# Patient Record
Sex: Male | Born: 1945 | Race: White | Hispanic: No | Marital: Married | Smoking: Never smoker
Health system: Southern US, Community
[De-identification: ages and names within clinical notes are randomized; demographics above are authoritative.]

## PROBLEM LIST (undated history)

## (undated) DIAGNOSIS — N400 Enlarged prostate without lower urinary tract symptoms: Secondary | ICD-10-CM

## (undated) DIAGNOSIS — R51 Headache: Secondary | ICD-10-CM

## (undated) DIAGNOSIS — I10 Essential (primary) hypertension: Secondary | ICD-10-CM

## (undated) DIAGNOSIS — K649 Unspecified hemorrhoids: Secondary | ICD-10-CM

## (undated) DIAGNOSIS — R202 Paresthesia of skin: Secondary | ICD-10-CM

## (undated) DIAGNOSIS — R011 Cardiac murmur, unspecified: Secondary | ICD-10-CM

## (undated) DIAGNOSIS — M109 Gout, unspecified: Secondary | ICD-10-CM

## (undated) DIAGNOSIS — M542 Cervicalgia: Secondary | ICD-10-CM

## (undated) DIAGNOSIS — IMO0001 Reserved for inherently not codable concepts without codable children: Secondary | ICD-10-CM

## (undated) DIAGNOSIS — R351 Nocturia: Secondary | ICD-10-CM

## (undated) DIAGNOSIS — G4700 Insomnia, unspecified: Secondary | ICD-10-CM

## (undated) DIAGNOSIS — N401 Enlarged prostate with lower urinary tract symptoms: Secondary | ICD-10-CM

## (undated) DIAGNOSIS — K219 Gastro-esophageal reflux disease without esophagitis: Secondary | ICD-10-CM

## (undated) DIAGNOSIS — C4492 Squamous cell carcinoma of skin, unspecified: Secondary | ICD-10-CM

## (undated) DIAGNOSIS — R42 Dizziness and giddiness: Secondary | ICD-10-CM

## (undated) DIAGNOSIS — C801 Malignant (primary) neoplasm, unspecified: Secondary | ICD-10-CM

## (undated) DIAGNOSIS — R519 Headache, unspecified: Secondary | ICD-10-CM

## (undated) DIAGNOSIS — R200 Anesthesia of skin: Secondary | ICD-10-CM

## (undated) DIAGNOSIS — M4802 Spinal stenosis, cervical region: Secondary | ICD-10-CM

## (undated) DIAGNOSIS — G992 Myelopathy in diseases classified elsewhere: Secondary | ICD-10-CM

## (undated) DIAGNOSIS — I809 Phlebitis and thrombophlebitis of unspecified site: Secondary | ICD-10-CM

## (undated) DIAGNOSIS — M1990 Unspecified osteoarthritis, unspecified site: Secondary | ICD-10-CM

## (undated) DIAGNOSIS — Z9889 Other specified postprocedural states: Secondary | ICD-10-CM

## (undated) DIAGNOSIS — R112 Nausea with vomiting, unspecified: Secondary | ICD-10-CM

---

## 2015-09-07 ENCOUNTER — Ambulatory Visit
Admission: RE | Admit: 2015-09-07 | Discharge: 2015-09-07 | Disposition: A | Discharge status: Home / Self Care | Payer: Medicare HMO | Source: Ambulatory Visit | Attending: Preventative Medicine | Admitting: Preventative Medicine

## 2015-09-07 ENCOUNTER — Other Ambulatory Visit: Payer: Self-pay | Admitting: Preventative Medicine

## 2015-09-07 DIAGNOSIS — M4182 Other forms of scoliosis, cervical region: Secondary | ICD-10-CM | POA: Diagnosis present

## 2015-09-07 DIAGNOSIS — M542 Cervicalgia: Secondary | ICD-10-CM | POA: Insufficient documentation

## 2015-09-13 ENCOUNTER — Emergency Department: Payer: Medicare HMO

## 2015-09-13 ENCOUNTER — Encounter: Payer: Self-pay | Admitting: Emergency Medicine

## 2015-09-13 ENCOUNTER — Emergency Department
Admission: EM | Admit: 2015-09-13 | Discharge: 2015-09-13 | Disposition: A | Discharge status: Home / Self Care | Payer: Medicare HMO | Attending: Emergency Medicine | Admitting: Emergency Medicine

## 2015-09-13 ENCOUNTER — Other Ambulatory Visit: Payer: Self-pay

## 2015-09-13 DIAGNOSIS — I10 Essential (primary) hypertension: Secondary | ICD-10-CM | POA: Insufficient documentation

## 2015-09-13 DIAGNOSIS — Z79899 Other long term (current) drug therapy: Secondary | ICD-10-CM | POA: Insufficient documentation

## 2015-09-13 DIAGNOSIS — M4802 Spinal stenosis, cervical region: Secondary | ICD-10-CM | POA: Diagnosis not present

## 2015-09-13 DIAGNOSIS — Z791 Long term (current) use of non-steroidal anti-inflammatories (NSAID): Secondary | ICD-10-CM | POA: Diagnosis not present

## 2015-09-13 DIAGNOSIS — Y9389 Activity, other specified: Secondary | ICD-10-CM | POA: Insufficient documentation

## 2015-09-13 DIAGNOSIS — Y9289 Other specified places as the place of occurrence of the external cause: Secondary | ICD-10-CM | POA: Diagnosis not present

## 2015-09-13 DIAGNOSIS — Z1389 Encounter for screening for other disorder: Secondary | ICD-10-CM

## 2015-09-13 DIAGNOSIS — Y998 Other external cause status: Secondary | ICD-10-CM | POA: Diagnosis not present

## 2015-09-13 DIAGNOSIS — M6281 Muscle weakness (generalized): Secondary | ICD-10-CM | POA: Diagnosis present

## 2015-09-13 DIAGNOSIS — R29898 Other symptoms and signs involving the musculoskeletal system: Secondary | ICD-10-CM

## 2015-09-13 HISTORY — DX: Unspecified osteoarthritis, unspecified site: M19.90

## 2015-09-13 HISTORY — DX: Essential (primary) hypertension: I10

## 2015-09-13 HISTORY — DX: Gout, unspecified: M10.9

## 2015-09-13 LAB — COMPREHENSIVE METABOLIC PANEL
ALK PHOS: 59 U/L (ref 38–126)
ALT: 17 U/L (ref 17–63)
AST: 19 U/L (ref 15–41)
Albumin: 3.8 g/dL (ref 3.5–5.0)
Anion gap: 5 (ref 5–15)
BILIRUBIN TOTAL: 0.9 mg/dL (ref 0.3–1.2)
BUN: 31 mg/dL — AB (ref 6–20)
CALCIUM: 10 mg/dL (ref 8.9–10.3)
CO2: 30 mmol/L (ref 22–32)
CREATININE: 0.66 mg/dL (ref 0.61–1.24)
Chloride: 95 mmol/L — ABNORMAL LOW (ref 101–111)
Glucose, Bld: 100 mg/dL — ABNORMAL HIGH (ref 65–99)
Potassium: 3.9 mmol/L (ref 3.5–5.1)
Sodium: 130 mmol/L — ABNORMAL LOW (ref 135–145)
Total Protein: 6.9 g/dL (ref 6.5–8.1)

## 2015-09-13 LAB — CBC WITH DIFFERENTIAL/PLATELET
BASOS PCT: 1 %
Basophils Absolute: 0.1 10*3/uL (ref 0–0.1)
EOS ABS: 0.2 10*3/uL (ref 0–0.7)
EOS PCT: 2 %
HCT: 40.5 % (ref 40.0–52.0)
HEMOGLOBIN: 13.5 g/dL (ref 13.0–18.0)
Lymphocytes Relative: 21 %
Lymphs Abs: 1.7 10*3/uL (ref 1.0–3.6)
MCH: 30 pg (ref 26.0–34.0)
MCHC: 33.4 g/dL (ref 32.0–36.0)
MCV: 89.7 fL (ref 80.0–100.0)
Monocytes Absolute: 0.7 10*3/uL (ref 0.2–1.0)
Monocytes Relative: 9 %
NEUTROS PCT: 67 %
Neutro Abs: 5.5 10*3/uL (ref 1.4–6.5)
PLATELETS: 198 10*3/uL (ref 150–440)
RBC: 4.52 MIL/uL (ref 4.40–5.90)
RDW: 13.4 % (ref 11.5–14.5)
WBC: 8.2 10*3/uL (ref 3.8–10.6)

## 2015-09-13 LAB — TROPONIN I

## 2015-09-13 MED ORDER — MORPHINE SULFATE (PF) 4 MG/ML IV SOLN
4.0000 mg | Freq: Once | INTRAVENOUS | Status: AC
  Administered 2015-09-13: 4 mg via INTRAVENOUS

## 2015-09-13 MED ORDER — MORPHINE SULFATE (PF) 4 MG/ML IV SOLN
INTRAVENOUS | Status: AC
  Administered 2015-09-13: 4 mg via INTRAVENOUS
  Filled 2015-09-13: qty 1

## 2015-09-13 MED ORDER — HYDROMORPHONE HCL 1 MG/ML IJ SOLN
0.5000 mg | Freq: Once | INTRAMUSCULAR | Status: AC
Start: 2015-09-13 — End: 2015-09-13
  Administered 2015-09-13: 0.5 mg via INTRAVENOUS

## 2015-09-13 MED ORDER — ONDANSETRON 4 MG PO TBDP
4.0000 mg | ORAL_TABLET | Freq: Once | ORAL | Status: AC
  Administered 2015-09-13 (×2): 4 mg via ORAL

## 2015-09-13 MED ORDER — ONDANSETRON 4 MG PO TBDP
ORAL_TABLET | ORAL | Status: AC
  Administered 2015-09-13: 4 mg via ORAL
  Filled 2015-09-13: qty 1

## 2015-09-13 MED ORDER — HYDROMORPHONE HCL 1 MG/ML IJ SOLN
INTRAMUSCULAR | Status: AC
  Administered 2015-09-13: 0.5 mg via INTRAVENOUS
  Filled 2015-09-13: qty 1

## 2015-09-13 MED ORDER — ONDANSETRON HCL 4 MG/2ML IJ SOLN
INTRAMUSCULAR | Status: AC
Start: 2015-09-13 — End: 2015-09-13
  Administered 2015-09-13: 4 mg via INTRAVENOUS
  Filled 2015-09-13: qty 2

## 2015-09-13 MED ORDER — SODIUM CHLORIDE 0.9 % IV SOLN
Freq: Once | INTRAVENOUS | Status: AC
  Administered 2015-09-13: 14:00:00 via INTRAVENOUS

## 2015-09-13 MED ORDER — ONDANSETRON HCL 4 MG/2ML IJ SOLN
4.0000 mg | Freq: Once | INTRAMUSCULAR | Status: AC
  Administered 2015-09-13: 4 mg via INTRAVENOUS

## 2015-09-13 NOTE — ED Notes (Signed)
Received a call from MRI - additional pain medication taken to MRI and administered  10/10 pain reported by pt

## 2015-09-13 NOTE — ED Notes (Signed)
meds administered as ordered - administered by Vicente Males RN    Pt to MRI

## 2015-09-13 NOTE — ED Notes (Signed)
Reports pain in neck x a week also tingling in right arm. No facial droop, right grip weaker than left.  Skin w/d

## 2015-09-13 NOTE — ED Provider Notes (Signed)
[facility] Emergency Department Provider Note  ____________________________________________  Time seen: Approximately 8:07 AM  I have reviewed the triage vital signs and the nursing notes.   HISTORY  Chief Complaint Weakness    HPI Benjamin HATTEN Sr. is a 70 y.o. male who complains of 6 months of increasing weakness in his right arm. He also complains of pain and popping sensations in his neck. He does not have any incoordination in his right arm. Reports his legs are working okay and does not have any headaches coughing or any other problems. He reports he had a lesion on his back with the dermatologist took off. He was told that the rash she has on his back was from his dressing he put there is a rash which looks like shingles does not hurt. In the dermatologist told him it was contact dermatitis type of problem.Patient had plain films of the neck which showed DJD and the radiologist recommended chest x-rays appeared to be an infiltrate in the upper part of the lung. Patient also complains of bad low back pain whenever he tries to lift anything with any weight to it at all.     Past Medical History  Diagnosis Date  . Hypertension   . Arthritis   . Gout     There are no active problems to display for this patient.   History reviewed. No pertinent past surgical history.  Current Outpatient Rx  Name  Route  Sig  Dispense  Refill  . atenolol (TENORMIN) 100 MG tablet   Oral   Take 100 mg by mouth daily.         . diclofenac (VOLTAREN) 75 MG EC tablet   Oral   Take 75 mg by mouth 2 (two) times daily.         Marland Kitchen lisinopril-hydrochlorothiazide (PRINZIDE,ZESTORETIC) 20-12.5 MG tablet   Oral   Take 2 tablets by mouth daily.           Allergies Codeine  History reviewed. No pertinent family history.  Social History Social History  Substance Use Topics  . Smoking status: Never Smoker   . Smokeless tobacco: None  . Alcohol Use: None     Review of Systems Constitutional: No fever/chills Eyes: No visual changes. ENT: No sore throat. Cardiovascular: Denies chest pain. Respiratory: Denies shortness of breath. Gastrointestinal: No abdominal pain.  No nausea, no vomiting.  No diarrhea.  No constipation. Genitourinary: Negative for dysuria. Musculoskeletal: Negative for back pain. Skin: See history of present illness Neurological: Negative for headaches, focal weakness or numbness.  10-point ROS otherwise negative.  ____________________________________________   PHYSICAL EXAM:  VITAL SIGNS: ED Triage Vitals  Enc Vitals Group     BP 09/13/15 0729 110/66 mmHg     Pulse Rate 09/13/15 0729 90     Resp 09/13/15 0729 18     Temp 09/13/15 0729 98.2 F (36.8 C)     Temp Source 09/13/15 0729 Oral     SpO2 09/13/15 0729 100 %     Weight 09/13/15 0729 195 lb (88.451 kg)     Height 09/13/15 0729 5\' 9"  (1.753 m)     Head Cir --      Peak Flow --      Pain Score 09/13/15 0724 9     Pain Loc --      Pain Edu? --      Excl. in [place]? --    Constitutional: Alert and oriented. Well appearing and in no acute  distress. Eyes: Conjunctivae are normal. PERRL. EOMI. Head: Atraumatic. Nose: No congestion/rhinnorhea. Mouth/Throat: Mucous membranes are moist.  Oropharynx non-erythematous. Neck: No stridor.  No cervical spine tenderness to palpation. Cardiovascular: Normal rate, regular rhythm. Grossly normal heart sounds.  Good peripheral circulation. Respiratory: Normal respiratory effort.  No retractions. Lungs CTAB. Gastrointestinal: Soft and nontender. No distention. No abdominal bruits. No CVA tenderness. Musculoskeletal: No lower extremity tenderness nor edema.  No joint effusions. Neurologic:  Normal speech and language. Cranial nerves II through XII appear to be intact motor strength is 4 over 5 in the right arm 5 over 5 in the left. Finger to nose is normal bilaterally legs have normal strength. Sensation appears to be  intact Skin:  Skin is warm, dry and intact. No rash noted. Psychiatric: Mood and affect are normal. Speech and behavior are normal.  ____________________________________________   LABS (all labs ordered are listed, but only abnormal results are displayed)  Labs Reviewed  COMPREHENSIVE METABOLIC PANEL - Abnormal; Notable for the following:    Sodium 130 (*)    Chloride 95 (*)    Glucose, Bld 100 (*)    BUN 31 (*)    All other components within normal limits  TROPONIN I  CBC WITH DIFFERENTIAL/PLATELET   ____________________________________________  EKG  EKG read and interpreted by me shows normal sinus rhythm a rate of 92 left axis poor R-wave progression otherwise normal. ____________________________________________  RADIOLOGY  Chest x-ray shows some atelectasis in the right middle lobe. Per radiology. L-spine shows DJD per radiology. This is present multilevel. ____________________________________________   PROCEDURES Patient signed out to be discharged got pale and sweaty. Felt lightheaded. EKG was repeated but did not show any acute ST-T wave changes and was bradycardic with PACs. Troponin was done was negative. Patient was given some fluids set up gradually and this time was able to leave without any difficulty. ____________________________________________   INITIAL IMPRESSION / ASSESSMENT AND PLAN / ED COURSE  Pertinent labs & imaging results that were available during my care of the patient were reviewed by me and considered in my medical decision making (see chart for details).   ____________________________________________   FINAL CLINICAL IMPRESSION(S) / ED DIAGNOSES  Final diagnoses:  Spinal stenosis of cervical region      Nena Polio, MD 09/13/15 [phone]

## 2015-09-13 NOTE — ED Notes (Signed)
Discharged per Dr. Cinda Quest if pt able to walk without difficulty and with stable vitals signs.

## 2015-09-13 NOTE — ED Notes (Signed)
Fall risk bracelet applied 

## 2015-09-13 NOTE — ED Notes (Signed)
Pt to discharge to home   Instructions reviewed and he and his spouse verbalized understanding and agreement

## 2015-09-13 NOTE — ED Notes (Signed)
Upon returning to the room with a WC to transfer him to the lobby  I found the pt lying in bed fully dressed  - cold, clammy diaphoretic  "I don't feel right - I am dizzy."  MD Malinda notified

## 2015-09-14 ENCOUNTER — Encounter (HOSPITAL_COMMUNITY): Payer: Self-pay | Admitting: Internal Medicine

## 2015-09-14 ENCOUNTER — Emergency Department
Admission: EM | Admit: 2015-09-14 | Discharge: 2015-09-14 | Disposition: A | Discharge status: Other Acute Inpatient Hospital | Payer: Medicare HMO | Attending: Emergency Medicine | Admitting: Emergency Medicine

## 2015-09-14 ENCOUNTER — Encounter: Payer: Self-pay | Admitting: Emergency Medicine

## 2015-09-14 ENCOUNTER — Inpatient Hospital Stay (HOSPITAL_COMMUNITY)
Admission: AD | Admit: 2015-09-14 | Discharge: 2015-09-16 | DRG: 092 | Disposition: A | Discharge status: Home / Self Care | Payer: Medicare HMO | Source: Other Acute Inpatient Hospital | Attending: Internal Medicine | Admitting: Internal Medicine

## 2015-09-14 DIAGNOSIS — R29898 Other symptoms and signs involving the musculoskeletal system: Secondary | ICD-10-CM | POA: Diagnosis not present

## 2015-09-14 DIAGNOSIS — G9520 Unspecified cord compression: Secondary | ICD-10-CM

## 2015-09-14 DIAGNOSIS — I10 Essential (primary) hypertension: Secondary | ICD-10-CM | POA: Diagnosis present

## 2015-09-14 DIAGNOSIS — E860 Dehydration: Secondary | ICD-10-CM | POA: Diagnosis present

## 2015-09-14 DIAGNOSIS — M1990 Unspecified osteoarthritis, unspecified site: Secondary | ICD-10-CM | POA: Diagnosis present

## 2015-09-14 DIAGNOSIS — R200 Anesthesia of skin: Secondary | ICD-10-CM

## 2015-09-14 DIAGNOSIS — R001 Bradycardia, unspecified: Secondary | ICD-10-CM | POA: Diagnosis present

## 2015-09-14 DIAGNOSIS — E871 Hypo-osmolality and hyponatremia: Secondary | ICD-10-CM | POA: Diagnosis present

## 2015-09-14 DIAGNOSIS — M4802 Spinal stenosis, cervical region: Secondary | ICD-10-CM

## 2015-09-14 DIAGNOSIS — R531 Weakness: Secondary | ICD-10-CM | POA: Insufficient documentation

## 2015-09-14 DIAGNOSIS — M5412 Radiculopathy, cervical region: Secondary | ICD-10-CM | POA: Diagnosis present

## 2015-09-14 DIAGNOSIS — M109 Gout, unspecified: Secondary | ICD-10-CM | POA: Diagnosis present

## 2015-09-14 DIAGNOSIS — R5381 Other malaise: Secondary | ICD-10-CM | POA: Diagnosis not present

## 2015-09-14 DIAGNOSIS — R110 Nausea: Secondary | ICD-10-CM | POA: Insufficient documentation

## 2015-09-14 DIAGNOSIS — R519 Headache, unspecified: Secondary | ICD-10-CM

## 2015-09-14 DIAGNOSIS — L089 Local infection of the skin and subcutaneous tissue, unspecified: Secondary | ICD-10-CM | POA: Diagnosis present

## 2015-09-14 DIAGNOSIS — R9431 Abnormal electrocardiogram [ECG] [EKG]: Secondary | ICD-10-CM | POA: Diagnosis present

## 2015-09-14 DIAGNOSIS — R202 Paresthesia of skin: Secondary | ICD-10-CM | POA: Diagnosis not present

## 2015-09-14 DIAGNOSIS — K5900 Constipation, unspecified: Secondary | ICD-10-CM | POA: Diagnosis present

## 2015-09-14 DIAGNOSIS — R253 Fasciculation: Secondary | ICD-10-CM

## 2015-09-14 DIAGNOSIS — Z885 Allergy status to narcotic agent status: Secondary | ICD-10-CM

## 2015-09-14 DIAGNOSIS — C4490 Unspecified malignant neoplasm of skin, unspecified: Secondary | ICD-10-CM | POA: Diagnosis present

## 2015-09-14 DIAGNOSIS — G9529 Other cord compression: Principal | ICD-10-CM | POA: Diagnosis present

## 2015-09-14 DIAGNOSIS — Z886 Allergy status to analgesic agent status: Secondary | ICD-10-CM

## 2015-09-14 DIAGNOSIS — R51 Headache: Secondary | ICD-10-CM | POA: Insufficient documentation

## 2015-09-14 DIAGNOSIS — Z79899 Other long term (current) drug therapy: Secondary | ICD-10-CM | POA: Insufficient documentation

## 2015-09-14 DIAGNOSIS — R609 Edema, unspecified: Secondary | ICD-10-CM | POA: Diagnosis not present

## 2015-09-14 DIAGNOSIS — R0600 Dyspnea, unspecified: Secondary | ICD-10-CM | POA: Diagnosis not present

## 2015-09-14 DIAGNOSIS — M542 Cervicalgia: Secondary | ICD-10-CM

## 2015-09-14 LAB — CBC
HCT: 40.4 % (ref 40.0–52.0)
HCT: 36.7 % — ABNORMAL LOW (ref 39.0–52.0)
Hemoglobin: 13.4 g/dL (ref 13.0–18.0)
Hemoglobin: 12.4 g/dL — ABNORMAL LOW (ref 13.0–17.0)
MCH: 29.6 pg (ref 26.0–34.0)
MCH: 30 pg (ref 26.0–34.0)
MCHC: 33.2 g/dL (ref 32.0–36.0)
MCHC: 33.8 g/dL (ref 30.0–36.0)
MCV: 89.2 fL (ref 80.0–100.0)
MCV: 88.9 fL (ref 78.0–100.0)
PLATELETS: 201 10*3/uL (ref 150–400)
Platelets: 200 10*3/uL (ref 150–440)
RBC: 4.53 MIL/uL (ref 4.40–5.90)
RBC: 4.13 MIL/uL — AB (ref 4.22–5.81)
RDW: 13.6 % (ref 11.5–14.5)
RDW: 12.8 % (ref 11.5–15.5)
WBC: 8.5 10*3/uL (ref 3.8–10.6)
WBC: 8.1 10*3/uL (ref 4.0–10.5)

## 2015-09-14 LAB — URINALYSIS COMPLETE WITH MICROSCOPIC (ARMC ONLY)
BACTERIA UA: NONE SEEN
Bilirubin Urine: NEGATIVE
Glucose, UA: NEGATIVE mg/dL
Hgb urine dipstick: NEGATIVE
KETONES UR: NEGATIVE mg/dL
LEUKOCYTES UA: NEGATIVE
NITRITE: NEGATIVE
PH: 6 (ref 5.0–8.0)
PROTEIN: NEGATIVE mg/dL
SPECIFIC GRAVITY, URINE: 1.016 (ref 1.005–1.030)

## 2015-09-14 LAB — BASIC METABOLIC PANEL
Anion gap: 8 (ref 5–15)
BUN: 26 mg/dL — ABNORMAL HIGH (ref 6–20)
CALCIUM: 10 mg/dL (ref 8.9–10.3)
CO2: 28 mmol/L (ref 22–32)
CREATININE: 0.67 mg/dL (ref 0.61–1.24)
Chloride: 92 mmol/L — ABNORMAL LOW (ref 101–111)
GFR calc non Af Amer: >60 mL/min (ref >60)
Glucose, Bld: 96 mg/dL (ref 65–99)
Potassium: 4.1 mmol/L (ref 3.5–5.1)
SODIUM: 128 mmol/L — AB (ref 135–145)

## 2015-09-14 LAB — CREATININE, SERUM
CREATININE: 0.82 mg/dL (ref 0.61–1.24)
GFR calc Af Amer: >60 mL/min (ref >60)

## 2015-09-14 MED ORDER — SODIUM CHLORIDE 0.9 % IV SOLN
INTRAVENOUS | Status: DC
  Administered 2015-09-14: 23:00:00 via INTRAVENOUS

## 2015-09-14 MED ORDER — ONDANSETRON HCL 4 MG/2ML IJ SOLN
4.0000 mg | Freq: Once | INTRAMUSCULAR | Status: AC
  Administered 2015-09-14: 4 mg via INTRAVENOUS
  Filled 2015-09-14: qty 2

## 2015-09-14 MED ORDER — POLYETHYLENE GLYCOL 3350 17 G PO PACK: 17.0000 g | PACK | Freq: Every day | ORAL | Status: DC | PRN

## 2015-09-14 MED ORDER — ATENOLOL 25 MG PO TABS
100.0000 mg | ORAL_TABLET | Freq: Once | ORAL | Status: AC
  Administered 2015-09-14: 100 mg via ORAL
  Filled 2015-09-14: qty 4

## 2015-09-14 MED ORDER — ONDANSETRON HCL 4 MG PO TABS
4.0000 mg | ORAL_TABLET | Freq: Four times a day (QID) | ORAL | Status: DC | PRN
  Administered 2015-09-16: 4 mg via ORAL
  Filled 2015-09-14: qty 1

## 2015-09-14 MED ORDER — LISINOPRIL 10 MG PO TABS
10.0000 mg | ORAL_TABLET | Freq: Every day | ORAL | Status: DC
  Administered 2015-09-15 – 2015-09-16 (×2): 10 mg via ORAL
  Filled 2015-09-14 (×2): qty 1

## 2015-09-14 MED ORDER — SODIUM CHLORIDE 0.9 % IV BOLUS (SEPSIS)
1000.0000 mL | Freq: Once | INTRAVENOUS | Status: AC
  Administered 2015-09-14: 1000 mL via INTRAVENOUS

## 2015-09-14 MED ORDER — BISACODYL 10 MG RE SUPP: 10.0000 mg | Freq: Every day | RECTAL | Status: DC | PRN

## 2015-09-14 MED ORDER — MORPHINE SULFATE (PF) 2 MG/ML IV SOLN
2.0000 mg | INTRAVENOUS | Status: DC | PRN
  Administered 2015-09-14 – 2015-09-15 (×3): 2 mg via INTRAVENOUS
  Filled 2015-09-14 (×3): qty 1

## 2015-09-14 MED ORDER — ENOXAPARIN SODIUM 40 MG/0.4ML ~~LOC~~ SOLN
40.0000 mg | SUBCUTANEOUS | Status: DC
  Administered 2015-09-14 – 2015-09-15 (×2): 40 mg via SUBCUTANEOUS
  Filled 2015-09-14 (×2): qty 0.4

## 2015-09-14 MED ORDER — KETOROLAC TROMETHAMINE 30 MG/ML IJ SOLN
30.0000 mg | Freq: Once | INTRAMUSCULAR | Status: AC
  Administered 2015-09-14: 30 mg via INTRAVENOUS
  Filled 2015-09-14: qty 1

## 2015-09-14 MED ORDER — SODIUM CHLORIDE 0.9 % IJ SOLN
3.0000 mL | Freq: Two times a day (BID) | INTRAMUSCULAR | Status: DC
  Administered 2015-09-14 – 2015-09-16 (×2): 3 mL via INTRAVENOUS

## 2015-09-14 MED ORDER — HYDROCODONE-ACETAMINOPHEN 5-325 MG PO TABS
1.0000 | ORAL_TABLET | ORAL | Status: DC | PRN
  Administered 2015-09-15 – 2015-09-16 (×7): 2 via ORAL
  Filled 2015-09-14 (×7): qty 2

## 2015-09-14 MED ORDER — SENNA 8.6 MG PO TABS
1.0000 | ORAL_TABLET | Freq: Two times a day (BID) | ORAL | Status: DC
  Administered 2015-09-14 – 2015-09-16 (×4): 8.6 mg via ORAL
  Filled 2015-09-14 (×4): qty 1

## 2015-09-14 MED ORDER — MUPIROCIN 2 % EX OINT
TOPICAL_OINTMENT | Freq: Two times a day (BID) | CUTANEOUS | Status: DC
  Administered 2015-09-14: 1 via TOPICAL
  Filled 2015-09-14: qty 22

## 2015-09-14 MED ORDER — ACETAMINOPHEN 650 MG RE SUPP: 650.0000 mg | Freq: Four times a day (QID) | RECTAL | Status: DC | PRN

## 2015-09-14 MED ORDER — MILK AND MOLASSES ENEMA
1.0000 | RECTAL | Status: DC | PRN
  Filled 2015-09-14: qty 250

## 2015-09-14 MED ORDER — DOXYCYCLINE HYCLATE 100 MG PO TABS
100.0000 mg | ORAL_TABLET | Freq: Two times a day (BID) | ORAL | Status: DC
  Administered 2015-09-14 – 2015-09-16 (×4): 100 mg via ORAL
  Filled 2015-09-14 (×4): qty 1

## 2015-09-14 MED ORDER — ONDANSETRON HCL 4 MG/2ML IJ SOLN
4.0000 mg | Freq: Four times a day (QID) | INTRAMUSCULAR | Status: DC | PRN
  Administered 2015-09-15: 4 mg via INTRAVENOUS
  Filled 2015-09-14: qty 2

## 2015-09-14 MED ORDER — DOCUSATE SODIUM 100 MG PO CAPS
100.0000 mg | ORAL_CAPSULE | Freq: Two times a day (BID) | ORAL | Status: DC
  Administered 2015-09-14 – 2015-09-16 (×4): 100 mg via ORAL
  Filled 2015-09-14 (×5): qty 1

## 2015-09-14 MED ORDER — ACETAMINOPHEN 325 MG PO TABS: 650.0000 mg | ORAL_TABLET | Freq: Four times a day (QID) | ORAL | Status: DC | PRN

## 2015-09-14 MED ORDER — HYDROMORPHONE HCL 1 MG/ML IJ SOLN
0.5000 mg | Freq: Once | INTRAMUSCULAR | Status: AC
  Administered 2015-09-14: 0.5 mg via INTRAVENOUS
  Filled 2015-09-14: qty 1

## 2015-09-14 NOTE — Consult Note (Signed)
[facility] NAME: Benjamin Turner    MR#:  [id number]  DATE OF BIRTH:  1945/11/11  DATE OF ADMISSION:  09/14/2015  PRIMARY CARE PHYSICIAN: Marguerita Merles, MD   CONSULT REQUESTING/REFERRING PHYSICIAN: Dr. Mariea Clonts  REASON FOR CONSULT: Right-sided weakness and inability to walk  CHIEF COMPLAINT:   Chief Complaint  Patient presents with  . Headache    HISTORY OF PRESENT ILLNESS:  Benjamin Turner  is a 70 y.o. male with a known history of hypertension, arthritis, gout. Presents to the emergency room with complaints of worsening right-sided weakness. Also decreased sensations on the right side. Patient was seen in the emergency room 2 days back with similar complaints and had MRI of the brain and cervical spine. Was discharged home to follow-up with the neurosurgeon as outpatient. But over the last 2 days his symptoms have quickly worsened to the point that he is unable to ambulate on his own. Does have chronic urinary urgency and some incontinence. Constipation for 2 days. Most of the symptoms have been worsening for the past 1 week. He did hear a popping sound in his neck one week back.  During my history and physical Dr. Doy Mince with neurology walked into the room and I let Dr. Doy Mince take further history and physical with the patient.  Full physical examination was not done  PAST MEDICAL HISTORY:   Past Medical History  Diagnosis Date  . Hypertension   . Arthritis   . Gout     PAST SURGICAL HISTOIRY:  History reviewed. No pertinent past surgical history.  SOCIAL HISTORY:   Social History  Substance Use Topics  . Smoking status: Never Smoker   . Smokeless tobacco: Not on file  . Alcohol Use: Not on file    FAMILY HISTORY:  No family history on file.  DRUG ALLERGIES:   Allergies  Allergen Reactions  . Codeine Nausea And Vomiting    REVIEW OF SYSTEMS:   ROS  CONSTITUTIONAL: Fatigue EYES: No blurred  or double vision.  EARS, NOSE, AND THROAT: No tinnitus or ear pain.  RESPIRATORY: No cough, shortness of breath, wheezing or hemoptysis.  CARDIOVASCULAR: No chest pain, orthopnea, edema.  GASTROINTESTINAL: No nausea, vomiting, diarrhea or abdominal pain.  GENITOURINARY: No dysuria, hematuria.  ENDOCRINE: No polyuria, nocturia,  HEMATOLOGY: No anemia, easy bruising or bleeding SKIN: No rash or lesion. MUSCULOSKELETAL: Jerking of his muscles. Chronic upper and lower back pain NEUROLOGIC: Weakness on the right side. Problems with balance. PSYCHIATRY: No anxiety or depression.   MEDICATIONS AT HOME:   Prior to Admission medications   Medication Sig Start Date End Date Taking? Authorizing Provider  atenolol (TENORMIN) 100 MG tablet Take 100 mg by mouth daily.   Yes Historical Provider, MD  diclofenac (VOLTAREN) 75 MG EC tablet Take 75 mg by mouth 2 (two) times daily.   Yes Historical Provider, MD  lisinopril-hydrochlorothiazide (PRINZIDE,ZESTORETIC) 20-12.5 MG tablet Take 2 tablets by mouth daily.   Yes Historical Provider, MD      VITAL SIGNS:  Blood pressure 131/53, pulse 35, temperature 98 F (36.7 C), temperature source Oral, resp. rate 20, height 5\' 8"  (1.727 m), weight 88.451 kg (195 lb), SpO2 96 %.  PHYSICAL EXAMINATION:  GENERAL:  70 y.o.-year-old patient lying in the bed with no acute distress.  HEENT: Head atraumatic, normocephalic.  LUNGS: Normal work of breathing ABDOMEN: No distention EXTREMITIES: No cyanosis, or clubbing.  PSYCHIATRIC: The patient is alert and oriented x 3.  Full physical examination not done  LABORATORY PANEL:   CBC  Recent Labs Lab 09/14/15 1022  WBC 8.5  HGB 13.4  HCT 40.4  PLT 200   ------------------------------------------------------------------------------------------------------------------  Chemistries   Recent Labs Lab 09/13/15 1526 09/14/15 1022  NA 130* 128*  K 3.9 4.1  CL 95* 92*  CO2 30 28  GLUCOSE 100* 96  BUN  31* 26*  CREATININE 0.66 0.67  CALCIUM 10.0 10.0  AST 19  --   ALT 17  --   ALKPHOS 59  --   BILITOT 0.9  --    ------------------------------------------------------------------------------------------------------------------  Cardiac Enzymes  Recent Labs Lab 09/13/15 1424  TROPONINI <0.03   ------------------------------------------------------------------------------------------------------------------  RADIOLOGY:  Dg Eye Foreign Body  09/13/2015  CLINICAL DATA:  Metal working/exposure; clearance prior to MRI EXAM: ORBITS FOR FOREIGN BODY - 2 VIEW COMPARISON:  None. FINDINGS: There is no evidence of metallic foreign body within the orbits. No significant bone abnormality identified. IMPRESSION: No evidence of metallic foreign body within the orbits. Electronically Signed   By: Inge Rise M.D.   On: 09/13/2015 09:41   Dg Chest 2 View  09/13/2015  CLINICAL DATA:  Two day history of chest pain EXAM: CHEST  2 VIEW COMPARISON:  None. FINDINGS: There is mild atelectatic change in the right middle lobe. Lungs elsewhere clear. Heart is upper normal in size with pulmonary vascularity within normal limits. No pneumothorax. No adenopathy. There is degenerative change in the thoracic spine. IMPRESSION: Mild right middle lobe region atelectatic change. Lungs elsewhere clear. Heart upper normal in size. Electronically Signed   By: Lowella Grip III M.D.   On: 09/13/2015 09:40   Dg Lumbar Spine Complete  09/13/2015  CLINICAL DATA:  Lumbago EXAM: LUMBAR SPINE - COMPLETE 4+ VIEW COMPARISON:  None. FINDINGS: Frontal, lateral, spot lumbosacral lateral, and bilateral oblique views were obtained. There are 5 non-rib-bearing lumbar type vertebral bodies. There is no fracture or spondylolisthesis. There is moderate disc space narrowing at L2-3, L4-5, and L5-S1 with vacuum phenomenon at L4-5. There is also degenerative change in the visualized lower thoracic regions. There is facet osteoarthritic  change to varying degrees at all levels, most notably at L4-5 and L5-S1 bilaterally. There is atherosclerotic calcification in the aorta. IMPRESSION: Multilevel osteoarthritic change.  No fracture or spondylolisthesis. Electronically Signed   By: Lowella Grip III M.D.   On: 09/13/2015 09:41   Mr Brain Wo Contrast  09/13/2015  CLINICAL DATA:  Right neck pain and tingling into the right arm for 1 week. Right arm weakness. EXAM: MRI HEAD WITHOUT CONTRAST TECHNIQUE: Multiplanar, multiecho pulse sequences of the brain and surrounding structures were obtained without intravenous contrast. COMPARISON:  MRI of the cervical spine 09/13/2015 FINDINGS: Diffusion-weighted images demonstrate no evidence for acute or subacute infarction. No acute hemorrhage or mass lesion is present. The ventricles are of normal size. Mild periventricular rib white matter changes bilaterally are slightly advanced for age. The ventricles are of normal size. 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 Remote lacunar infarcts are present in the thalami bilaterally. No significant extra-axial fluid collection is present. Flow is present in the major intracranial arteries. The globes and orbits are intact. The paranasal sinuses and mastoid air cells are clear. Skullbase is within normal limits. Midline sagittal images demonstrate prominent soft tissue posterior to the dens at C1-2. There some narrowing of the canal at this level. Moderate degenerative changes are also present at C2-3 anterior car greater extent at C3-4. Intracranial  midline sagittal images are unremarkable. IMPRESSION: 1. No acute intracranial abnormality. 2. Mild atrophy and white matter changes are somewhat advanced for age. 3. 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 4. 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 Electronically Signed   By: San Morelle M.D.   On: 09/13/2015 11:06   Mr Cervical Spine  Wo Contrast  09/13/2015  CLINICAL DATA:  Neck pain for 1 week with tingling in the right arm. EXAM: MRI CERVICAL SPINE WITHOUT CONTRAST TECHNIQUE: Multiplanar, multisequence MR imaging of the cervical spine was performed. No intravenous contrast was administered. COMPARISON:  None. FINDINGS: No marrow signal abnormality suggestive of fracture, infection, or neoplasm. Extradural hyperintensity intermittently seen posteriorly and from C2 to C3, best visualized on axial gradient imaging in the right posterior canal measuring 4 mm in maximal thickness. This could reflect degenerative cyst formation or a extradural collection/hematoma. This does not significantly contribute to degenerative stenosis. Cord is compressed by degenerative changes at multiple levels as described below. No definite cord edema but limited by motion. Craniocervical junction: Retro dental ligamentum thickening without evidence of erosion. Rotation at the C1-2 articulation with joint effusion on the right. C2-3: Disc: Mild uncovertebral spurring.  No herniation Facets: Arthropathy with probable ankylosis on the left. Canal: Patent.  Epidural cystic change as described above. Foramina: No impingement. C3-4: Disc: Disc narrowing with posterior disc osteophyte complex and uncovertebral spurs. Facets: Severe arthropathy with spurring greater on the left. Ligamentum flavum thickening and buckling Canal: Advanced stenosis with moderate cord compression. No definite cord signal abnormality. Foramina: Bilateral stenosis, particularly advanced on the left C4-5: Disc: Disc narrowing with posterior disc osteophyte complex and bilateral uncovertebral spurs Facets: Advanced bilateral arthropathy with left-sided joint effusion. Bulky ligamentum flavum thickening Canal: Advanced stenosis with moderate cord compression. No definite cord signal abnormality. Foramina: Advanced bilateral stenosis C5-6: Disc: Advanced disc narrowing with posterior disc osteophyte  complex and bulky right uncovertebral spurring. Facets: Advanced arthropathy with spurring. Ligamentum flavum thickening. Canal: Moderate stenosis with circumferential effacement of CSF and no cord mass effect. Foramina: Bilateral stenosis, worse on the right at the inferior left foramen is visible. C6-7: Disc: Disc narrowing with right uncovertebral spurring. Facets: Arthropathy with spurring greater on the left Canal: Mild stenosis, mainly discogenic Foramina: Moderate to advanced stenosis on the right. Open on the left. C7-T1: Disc: Mild bulging of the uncovered disc Facets: Arthropathy with mild anterolisthesis Canal: Patent. Foramina: No impingement. IMPRESSION: 1. 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 No definite cord signal abnormality, but limited by patient motion. 2. Multilevel foraminal stenosis, advanced from C3-4 to C5-6. 3. Dorsal epidural cystic change at C2/C3 is likely degenerative ganglion, less likely hematoma. This does not significantly contribute to the described canal stenosis. Electronically Signed   By: Monte Fantasia M.D.   On: 09/13/2015 10:53    EKG:   Orders placed or performed during the hospital encounter of 09/14/15  . ED EKG  . ED EKG  . EKG 12-Lead  . EKG 12-Lead  . EKG 12-Lead  . 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 Review of MRI of the brain and cervical spine. Discussed with Dr. Doy Mince of neurology who has advised that patient needs evaluation by a neurosurgeon as inpatient. This is not available  here at [facility]. Discussed with patient and family requested patient be transferred to [facility] in [place]. Communicated with Dr. Mariea Clonts of emergency room who will be transferring the patient.  *  Mild asymptomatic hyponatremia Could be dehydration versus SIADH from pain.   All the records are reviewed and case discussed with Consulting provider. Management plans discussed with the patient, family and they are in agreement.   TOTAL TIME TAKING CARE OF THIS PATIENT: 40 minutes. > 50% time spent in co-ordinating care and discussing with patient and family   Neita Carp M.D on 09/14/2015 at 2:38 PM  Between 7am to 6pm - Pager - [phone]  After 6pm go to [email or web address] - password [email or web address] [facility]  Office  [phone]  CC: Primary care Physician: Marguerita Merles, MD     Note: This dictation was prepared with Dragon dictation along with smaller phrase technology. Any transcriptional errors that result from this process are unintentional.

## 2015-09-14 NOTE — ED Provider Notes (Addendum)
[facility] Emergency Department Provider Note  ____________________________________________  Time seen: Approximately 9:49 AM  I have reviewed the triage vital signs and the nursing notes.   HISTORY  Chief Complaint Headache    HPI Benjamin DEETER Sr. is a 70 y.o. male history of hypertension presenting for inability to walk. Patient reports that over the past several weeks he has a aggressively worsening headache "all over" with associated neck stiffness. He has also had generalized weakness, now has developed tingling throughout the right upper extremity. This is also associated with inability to walk due to his weakness. He has lightheadedness without dizziness or room spinning sensation. He denies visual changes. No recent illness including fever, chills, cough or cold symptoms. No chest pain or shortness of breath.  Patient was seen here yesterday for same and underwent laboratory testing which did not show any abnormalities other than mild hyponatremia, and he underwent MRI of the brain that showed remote infarcts and white matter loss advanced for age but otherwise no acute abnormalities. The patient is significantly worse today and was "stumbling" and unable to walk by himself this morning.    Past Medical History  Diagnosis Date  . Hypertension   . Arthritis   . Gout     There are no active problems to display for this patient.   History reviewed. No pertinent past surgical history.  Current Outpatient Rx  Name  Route  Sig  Dispense  Refill  . atenolol (TENORMIN) 100 MG tablet   Oral   Take 100 mg by mouth daily.         . diclofenac (VOLTAREN) 75 MG EC tablet   Oral   Take 75 mg by mouth 2 (two) times daily.         Marland Kitchen lisinopril-hydrochlorothiazide (PRINZIDE,ZESTORETIC) 20-12.5 MG tablet   Oral   Take 2 tablets by mouth daily.           Allergies Codeine  No family history on file.  Social History Social History   Substance Use Topics  . Smoking status: Never Smoker   . Smokeless tobacco: None  . Alcohol Use: None    Review of Systems  Constitutional: No fever/chillsPositive lightheadedness. Negative syncope. Positive generalized weakness and "stumbling".  Eyes: No visual changes. No ptosis of the lids. No blurred or double vision. ENT: No sore throat. Cardiovascular: Denies chest pain, palpitations. Respiratory: Denies shortness of breath.  No cough. Gastrointestinal: No abdominal pain.  Positive nausea, no vomiting.  No diarrhea.  No constipation. Genitourinary: Negative for dysuria. Musculoskeletal: Negative for back pain. Positive neck pain. Skin: Negative for rash. Neurologic: Positive headache. Positive tingling in the right upper extremity. Positive inability to walk.  10-point ROS otherwise negative.  ____________________________________________   PHYSICAL EXAM:  VITAL SIGNS: ED Triage Vitals  Enc Vitals Group     BP 09/14/15 0855 124/72 mmHg     Pulse Rate 09/14/15 0855 63     Resp 09/14/15 0855 18     Temp 09/14/15 0855 98 F (36.7 C)     Temp Source 09/14/15 0855 Oral     SpO2 09/14/15 0855 97 %     Weight 09/14/15 0855 195 lb (88.451 kg)     Height 09/14/15 0855 5\' 8"  (1.727 m)     Head Cir --      Peak Flow --      Pain Score 09/14/15 0848 10     Pain Loc --      Pain  Edu? --      Excl. in [place]? --     Constitutional: Patient is alert and oriented but appears that he doesn't feel well. He is in no acute distress and nontoxic.  Eyes: Conjunctivae are normal.  EOMI. PERRLA. No ptosis of the lids. Head: Atraumatic. Nose: No congestion/rhinnorhea. Mouth/Throat: Mucous membranes are moist.  Neck: No stridor.  Mildly stiff neck but has full range of motion without significant pain. Cardiovascular: Normal rate, regular rhythm. No murmurs, rubs or gallops.  Respiratory: Normal respiratory effort.  No retractions. Lungs CTAB.  No wheezes, rales or  ronchi. Gastrointestinal: Soft and nontender. No distention. No peritoneal signs. Musculoskeletal: No LE edema. Positive fasciculations of the bilateral deltoids. Neurologic: Alert and oriented 3. Speech is clear. Face and smile symmetric. Tongue is midline but does have fasciculations. EOMI and PERRLA.  No pronator drift. 5 out of 5 grip, biceps, triceps, and 4+/5 hip flexors, plantar flexion and dorsiflexion. Normal sensation to light touch in the bilateral lower extremities, and face. Decreased sensation diffusely throughout the right upper extremity. Unable to test gait due to weakness.  Skin:  Skin is warm, dry. Dermatologic biopsies in the right lateral malleolus and left upper back. Psychiatric: Mood is depressed and affect is flat.Marland Kitchen Speech and behavior are normal.  Normal judgement.  ____________________________________________   LABS (all labs ordered are listed, but only abnormal results are displayed)  Labs Reviewed  URINALYSIS COMPLETEWITH MICROSCOPIC ([place])  CBC  BASIC METABOLIC PANEL   ____________________________________________  EKG  ED ECG REPORT I, Eula Listen, the attending physician, personally viewed and interpreted this ECG.   Date: 09/14/2015  EKG Time: 855  Rate: 74  Rhythm: Sinus arrhythmia  Axis: Leftward  Intervals:none  ST&T Change: No ST elevation. Patient appears to have sinus arrhythmia or sinus with a junctional beat after each QRS.  The patient's EKG is unchanged compared to yesterday which also shows supraventricular bigeminy. ____________________________________________  [id number]  Mr Brain Wo Contrast  09/13/2015  CLINICAL DATA:  Right neck pain and tingling into the right arm for 1 week. Right arm weakness. EXAM: MRI HEAD WITHOUT CONTRAST TECHNIQUE: Multiplanar, multiecho pulse sequences of the brain and surrounding structures were obtained without intravenous contrast. COMPARISON:  MRI of the cervical spine 09/13/2015  FINDINGS: Diffusion-weighted images demonstrate no evidence for acute or subacute infarction. No acute hemorrhage or mass lesion is present. The ventricles are of normal size. Mild periventricular rib white matter changes bilaterally are slightly advanced for age. The ventricles are of normal size. 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 Remote lacunar infarcts are present in the thalami bilaterally. No significant extra-axial fluid collection is present. Flow is present in the major intracranial arteries. The globes and orbits are intact. The paranasal sinuses and mastoid air cells are clear. Skullbase is within normal limits. Midline sagittal images demonstrate prominent soft tissue posterior to the dens at C1-2. There some narrowing of the canal at this level. Moderate degenerative changes are also present at C2-3 anterior car greater extent at C3-4. Intracranial midline sagittal images are unremarkable. IMPRESSION: 1. No acute intracranial abnormality. 2. Mild atrophy and white matter changes are somewhat advanced for age. 3. 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 4. 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 Electronically Signed   By: San Morelle M.D.   On: 09/13/2015 11:06   Mr Cervical Spine Wo Contrast  09/13/2015  CLINICAL DATA:  Neck pain for 1 week with  tingling in the right arm. EXAM: MRI CERVICAL SPINE WITHOUT CONTRAST TECHNIQUE: Multiplanar, multisequence MR imaging of the cervical spine was performed. No intravenous contrast was administered. COMPARISON:  None. FINDINGS: No marrow signal abnormality suggestive of fracture, infection, or neoplasm. Extradural hyperintensity intermittently seen posteriorly and from C2 to C3, best visualized on axial gradient imaging in the right posterior canal measuring 4 mm in maximal thickness. This could reflect degenerative cyst formation or a extradural collection/hematoma. This  does not significantly contribute to degenerative stenosis. Cord is compressed by degenerative changes at multiple levels as described below. No definite cord edema but limited by motion. Craniocervical junction: Retro dental ligamentum thickening without evidence of erosion. Rotation at the C1-2 articulation with joint effusion on the right. C2-3: Disc: Mild uncovertebral spurring.  No herniation Facets: Arthropathy with probable ankylosis on the left. Canal: Patent.  Epidural cystic change as described above. Foramina: No impingement. C3-4: Disc: Disc narrowing with posterior disc osteophyte complex and uncovertebral spurs. Facets: Severe arthropathy with spurring greater on the left. Ligamentum flavum thickening and buckling Canal: Advanced stenosis with moderate cord compression. No definite cord signal abnormality. Foramina: Bilateral stenosis, particularly advanced on the left C4-5: Disc: Disc narrowing with posterior disc osteophyte complex and bilateral uncovertebral spurs Facets: Advanced bilateral arthropathy with left-sided joint effusion. Bulky ligamentum flavum thickening Canal: Advanced stenosis with moderate cord compression. No definite cord signal abnormality. Foramina: Advanced bilateral stenosis C5-6: Disc: Advanced disc narrowing with posterior disc osteophyte complex and bulky right uncovertebral spurring. Facets: Advanced arthropathy with spurring. Ligamentum flavum thickening. Canal: Moderate stenosis with circumferential effacement of CSF and no cord mass effect. Foramina: Bilateral stenosis, worse on the right at the inferior left foramen is visible. C6-7: Disc: Disc narrowing with right uncovertebral spurring. Facets: Arthropathy with spurring greater on the left Canal: Mild stenosis, mainly discogenic Foramina: Moderate to advanced stenosis on the right. Open on the left. C7-T1: Disc: Mild bulging of the uncovered disc Facets: Arthropathy with mild anterolisthesis Canal: Patent. Foramina:  No impingement. IMPRESSION: 1. 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 No definite cord signal abnormality, but limited by patient motion. 2. Multilevel foraminal stenosis, advanced from C3-4 to C5-6. 3. Dorsal epidural cystic change at C2/C3 is likely degenerative ganglion, less likely hematoma. This does not significantly contribute to the described canal stenosis. Electronically Signed   By: Monte Fantasia M.D.   On: 09/13/2015 10:53    ____________________________________________   PROCEDURES  Procedure(s) performed: None  Critical Care performed: No ____________________________________________   INITIAL IMPRESSION / ASSESSMENT AND PLAN / ED COURSE  Pertinent labs & imaging results that were available during my care of the patient were reviewed by me and considered in my medical decision making (see chart for details).  70 y.o. male with a history of hypertension presenting with generalized weakness, headache, neck pain, tickling in the right arm, and abnormal EKG. I am concerned about ALS this patient given the fasciculations of his deltoid muscles as well as his tongue. I spoke with Dr. Doy Mince who will come see the patient. The patient also has supraventricular bigeminy which she had yesterday but may be a new finding for him. Anticipate admission.  ----------------------------------------- 11:35 AM on 09/14/2015 -----------------------------------------  The patient has slightly worsened hyponatremia with sodium of 128 today. He continues to feel weak and will be admitted to the hospital. It is possible that his right arm tingling may be related to cervical radiculopathy given his C-spine changes  that were noted in the MRI yesterday. Dr. Doy Mince will still come see the patient because I'm concerned about the fasciculations.  ----------------------------------------- 2:34 PM on  09/14/2015 -----------------------------------------  The patient was seen and evaluated by both the hospitalist and Dr. Doy Mince, our neurologist on-call. His symptoms seem most consistent with cervical spinal stenosis, but given the extent of his symptoms, they would recommend transfer for evaluation by spine specialist. We do not have a spine specialist here at [facility] who has the capacity to evaluate this patient for possible intervention. I will plan to call for transfer.  ----------------------------------------- 3:19 PM on 09/14/2015 -----------------------------------------  I have spoken with [facility] for [facility], and they have been in contact with the neurosurgeon on-call. This neurosurgeon did hear about the patient yesterday from Dr. Rip Harbour and recommended outpatient follow-up on Friday. Unfortunately, the patient is now no longer able to walk so he is not safe for outpatient follow-up. We are trying to have the patient admitted to the hospitalist with neurosurgical consultation.  ____________________________________________  FINAL CLINICAL IMPRESSION(S) / ED DIAGNOSES  Final diagnoses:  Fasciculation of tongue  Muscle fasciculation  Acute nonintractable headache, unspecified headache type  Neck pain  Numbness and tingling of right arm      NEW MEDICATIONS STARTED DURING THIS VISIT:  New Prescriptions   No medications on file     Eula Listen, MD 09/14/15 1003  Eula Listen, MD 09/14/15 1006  Eula Listen, MD 09/14/15 1136  Eula Listen, MD 09/14/15 1526

## 2015-09-14 NOTE — Progress Notes (Signed)
Progress Note  Spinal stenosis w/ cord compression. Neurosurgery, Dr Kathyrn Sheriff, aware and asked for transport to [facility] for surgical decompression. Requesting Medicine admit w/ Neurosurgery as consult. Na 128.   [facility] to transfer pt to [facility].   Linna Darner, MD Family Medicine 09/14/2015, 3:43 PM

## 2015-09-14 NOTE — Consult Note (Signed)
Reason for Consult:RUE weakness and numbness Referring Physician: Mariea Clonts  CC: RUE weakness, numbness and difficulty with gait.    HPI: Benjamin BRAMLETT Sr. is an 70 y.o. male who reports that he has had progressive problems for many months.  Reports that he has had some numbness in his arms for months (right greater than left).  Reports this has progressed to the point that he has been unable to lift his right arm all of the way.  He now is numb in his right arm as well.  He has numbness in his left arm but to a lesser degree.  Over the past couple of weeks has noticed difficulty walking that has been progressive as well.  Does not have any lower extremity numbness nor any bladder incontinence but has been constipated for the past week which is unusual for him.  Reports severe neck pain that has been progressive as well and makes him feel as if his head is about to shoot off his neck.    Past Medical History  Diagnosis Date  . Hypertension   . Arthritis   . Gout     History reviewed. No pertinent past surgical history.  No family history on file.  Social History:  reports that he has never smoked. He does not have any smokeless tobacco history on file. His alcohol and drug histories are not on file.  Allergies  Allergen Reactions  . Codeine Nausea And Vomiting    Medications: I have reviewed the patient's current medications. Prior to Admission:  Prior to Admission medications   Medication Sig Start Date End Date Taking? Authorizing Provider  atenolol (TENORMIN) 100 MG tablet Take 100 mg by mouth daily.   Yes Historical Provider, MD  diclofenac (VOLTAREN) 75 MG EC tablet Take 75 mg by mouth 2 (two) times daily.   Yes Historical Provider, MD  lisinopril-hydrochlorothiazide (PRINZIDE,ZESTORETIC) 20-12.5 MG tablet Take 2 tablets by mouth daily.   Yes Historical Provider, MD    ROS: History obtained from the patient  General ROS: negative for - chills, fatigue, fever, night sweats,  weight gain or weight loss Psychological ROS: negative for - behavioral disorder, hallucinations, memory difficulties, mood swings or suicidal ideation Ophthalmic ROS: negative for - blurry vision, double vision, eye pain or loss of vision ENT ROS: negative for - epistaxis, nasal discharge, oral lesions, sore throat, tinnitus or vertigo Allergy and Immunology ROS: negative for - hives or itchy/watery eyes Hematological and Lymphatic ROS: negative for - bleeding problems, bruising or swollen lymph nodes Endocrine ROS: negative for - galactorrhea, hair pattern changes, polydipsia/polyuria or temperature intolerance Respiratory ROS: negative for - cough, hemoptysis, shortness of breath or wheezing Cardiovascular ROS: lower extremity edema  Gastrointestinal ROS: as noted in HPI Genito-Urinary ROS: negative for - dysuria, hematuria, incontinence or urinary frequency/urgency Musculoskeletal ROS: negative for - joint swelling or muscular weakness Neurological ROS: as noted in HPI Dermatological ROS: negative for rash and skin lesion changes  Physical Examination: Blood pressure 157/90, pulse 77, temperature 98 F (36.7 C), temperature source Oral, resp. rate 18, height 5\' 8"  (1.727 m), weight 88.451 kg (195 lb), SpO2 95 %.  HEENT-  Normocephalic, no lesions, without obvious abnormality.  Normal external eye and conjunctiva.  Normal TM's bilaterally.  Normal auditory canals and external ears. Normal external nose, mucus membranes and septum.  Normal pharynx. Cardiovascular- S1, S2 normal, pulses palpable throughout   Lungs- chest clear, no wheezing, rales, normal symmetric air entry Abdomen- soft, non-tender; bowel sounds normal;  no masses,  no organomegaly Extremities- BLE edema Lymph-no adenopathy palpable Musculoskeletal-no joint tenderness, deformity or swelling Skin-warm and dry, no hyperpigmentation, vitiligo, or suspicious lesions  Neurological Examination Mental Status: Alert, oriented,  thought content appropriate.  Speech fluent without evidence of aphasia.  Able to follow 3 step commands without difficulty. Cranial Nerves: II: Discs flat bilaterally; Visual fields grossly normal, pupils equal, round, reactive to light and accommodation III,IV, VI: ptosis not present, extra-ocular motions intact bilaterally V,VII: smile symmetric, facial light touch sensation normal bilaterally VIII: hearing normal bilaterally IX,X: gag reflex present XI: bilateral shoulder shrug XII: midline tongue extension Motor: Right : Upper extremity   3-4/5    Left:     Upper extremity   5-/5  Lower extremity   5-/5     Lower extremity   5/5 Unable to lift the RUE at the shoulder greater than 45 degrees.  Muscle atrophy noted in the hands bilaterally Sensory: Pinprick and light touch decreased on the left, most notable in the RUE Deep Tendon Reflexes: 2+ and symmetric with trace ankle jerks bilaterally Plantars: Right: upgoing   Left: upgoing Cerebellar: normal finger-to-nose and normal heel-to-shin testing bilaterally Gait: not tested due to safety concerns      Laboratory Studies:   Basic Metabolic Panel:  Recent Labs Lab 09/13/15 1526 09/14/15 1022  NA 130* 128*  K 3.9 4.1  CL 95* 92*  CO2 30 28  GLUCOSE 100* 96  BUN 31* 26*  CREATININE 0.66 0.67  CALCIUM 10.0 10.0    Liver Function Tests:  Recent Labs Lab 09/13/15 1526  AST 19  ALT 17  ALKPHOS 59  BILITOT 0.9  PROT 6.9  ALBUMIN 3.8   No results for input(s): LIPASE, AMYLASE in the last 168 hours. No results for input(s): AMMONIA in the last 168 hours.  CBC:  Recent Labs Lab 09/13/15 1526 09/14/15 1022  WBC 8.2 8.5  NEUTROABS 5.5  --   HGB 13.5 13.4  HCT 40.5 40.4  MCV 89.7 89.2  PLT 198 200    Cardiac Enzymes:  Recent Labs Lab 09/13/15 1424  TROPONINI <0.03    BNP: Invalid input(s): POCBNP  CBG: No results for input(s): GLUCAP in the last 168 hours.  Microbiology: No results found for  this or any previous visit.  Coagulation Studies: No results for input(s): LABPROT, INR in the last 72 hours.  Urinalysis:  Recent Labs Lab 09/14/15 1022  COLORURINE YELLOW*  LABSPEC 1.016  PHURINE 6.0  GLUCOSEU NEGATIVE  HGBUR NEGATIVE  BILIRUBINUR NEGATIVE  KETONESUR NEGATIVE  PROTEINUR NEGATIVE  NITRITE NEGATIVE  LEUKOCYTESUR NEGATIVE    Lipid Panel:  No results found for: CHOL, TRIG, HDL, CHOLHDL, VLDL, LDLCALC  HgbA1C: No results found for: HGBA1C  Urine Drug Screen:  No results found for: LABOPIA, COCAINSCRNUR, LABBENZ, AMPHETMU, THCU, LABBARB  Alcohol Level: No results for input(s): ETH in the last 168 hours.  Other results: EKG: sinus rhythm at 65 bpm.  Imaging: Dg Eye Foreign Body  09/13/2015  CLINICAL DATA:  Metal working/exposure; clearance prior to MRI EXAM: ORBITS FOR FOREIGN BODY - 2 VIEW COMPARISON:  None. FINDINGS: There is no evidence of metallic foreign body within the orbits. No significant bone abnormality identified. IMPRESSION: No evidence of metallic foreign body within the orbits. Electronically Signed   By: Inge Rise M.D.   On: 09/13/2015 09:41   Dg Chest 2 View  09/13/2015  CLINICAL DATA:  Two day history of chest pain EXAM: CHEST  2 VIEW COMPARISON:  None. FINDINGS: There is mild atelectatic change in the right middle lobe. Lungs elsewhere clear. Heart is upper normal in size with pulmonary vascularity within normal limits. No pneumothorax. No adenopathy. There is degenerative change in the thoracic spine. IMPRESSION: Mild right middle lobe region atelectatic change. Lungs elsewhere clear. Heart upper normal in size. Electronically Signed   By: Lowella Grip III M.D.   On: 09/13/2015 09:40   Dg Lumbar Spine Complete  09/13/2015  CLINICAL DATA:  Lumbago EXAM: LUMBAR SPINE - COMPLETE 4+ VIEW COMPARISON:  None. FINDINGS: Frontal, lateral, spot lumbosacral lateral, and bilateral oblique views were obtained. There are 5 non-rib-bearing lumbar  type vertebral bodies. There is no fracture or spondylolisthesis. There is moderate disc space narrowing at L2-3, L4-5, and L5-S1 with vacuum phenomenon at L4-5. There is also degenerative change in the visualized lower thoracic regions. There is facet osteoarthritic change to varying degrees at all levels, most notably at L4-5 and L5-S1 bilaterally. There is atherosclerotic calcification in the aorta. IMPRESSION: Multilevel osteoarthritic change.  No fracture or spondylolisthesis. Electronically Signed   By: Lowella Grip III M.D.   On: 09/13/2015 09:41   Mr Brain Wo Contrast  09/13/2015  CLINICAL DATA:  Right neck pain and tingling into the right arm for 1 week. Right arm weakness. EXAM: MRI HEAD WITHOUT CONTRAST TECHNIQUE: Multiplanar, multiecho pulse sequences of the brain and surrounding structures were obtained without intravenous contrast. COMPARISON:  MRI of the cervical spine 09/13/2015 FINDINGS: Diffusion-weighted images demonstrate no evidence for acute or subacute infarction. No acute hemorrhage or mass lesion is present. The ventricles are of normal size. Mild periventricular rib white matter changes bilaterally are slightly advanced for age. The ventricles are of normal size. 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 Remote lacunar infarcts are present in the thalami bilaterally. No significant extra-axial fluid collection is present. Flow is present in the major intracranial arteries. The globes and orbits are intact. The paranasal sinuses and mastoid air cells are clear. Skullbase is within normal limits. Midline sagittal images demonstrate prominent soft tissue posterior to the dens at C1-2. There some narrowing of the canal at this level. Moderate degenerative changes are also present at C2-3 anterior car greater extent at C3-4. Intracranial midline sagittal images are unremarkable. IMPRESSION: 1. No acute intracranial abnormality. 2. Mild atrophy and white matter  changes are somewhat advanced for age. 3. 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 4. 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 Electronically Signed   By: San Morelle M.D.   On: 09/13/2015 11:06   Mr Cervical Spine Wo Contrast  09/13/2015  CLINICAL DATA:  Neck pain for 1 week with tingling in the right arm. EXAM: MRI CERVICAL SPINE WITHOUT CONTRAST TECHNIQUE: Multiplanar, multisequence MR imaging of the cervical spine was performed. No intravenous contrast was administered. COMPARISON:  None. FINDINGS: No marrow signal abnormality suggestive of fracture, infection, or neoplasm. Extradural hyperintensity intermittently seen posteriorly and from C2 to C3, best visualized on axial gradient imaging in the right posterior canal measuring 4 mm in maximal thickness. This could reflect degenerative cyst formation or a extradural collection/hematoma. This does not significantly contribute to degenerative stenosis. Cord is compressed by degenerative changes at multiple levels as described below. No definite cord edema but limited by motion. Craniocervical junction: Retro dental ligamentum thickening without evidence of erosion. Rotation at the C1-2 articulation with joint effusion on the right. C2-3: Disc: Mild uncovertebral spurring.  No herniation Facets: Arthropathy  with probable ankylosis on the left. Canal: Patent.  Epidural cystic change as described above. Foramina: No impingement. C3-4: Disc: Disc narrowing with posterior disc osteophyte complex and uncovertebral spurs. Facets: Severe arthropathy with spurring greater on the left. Ligamentum flavum thickening and buckling Canal: Advanced stenosis with moderate cord compression. No definite cord signal abnormality. Foramina: Bilateral stenosis, particularly advanced on the left C4-5: Disc: Disc narrowing with posterior disc osteophyte complex and bilateral uncovertebral spurs Facets: Advanced bilateral  arthropathy with left-sided joint effusion. Bulky ligamentum flavum thickening Canal: Advanced stenosis with moderate cord compression. No definite cord signal abnormality. Foramina: Advanced bilateral stenosis C5-6: Disc: Advanced disc narrowing with posterior disc osteophyte complex and bulky right uncovertebral spurring. Facets: Advanced arthropathy with spurring. Ligamentum flavum thickening. Canal: Moderate stenosis with circumferential effacement of CSF and no cord mass effect. Foramina: Bilateral stenosis, worse on the right at the inferior left foramen is visible. C6-7: Disc: Disc narrowing with right uncovertebral spurring. Facets: Arthropathy with spurring greater on the left Canal: Mild stenosis, mainly discogenic Foramina: Moderate to advanced stenosis on the right. Open on the left. C7-T1: Disc: Mild bulging of the uncovered disc Facets: Arthropathy with mild anterolisthesis Canal: Patent. Foramina: No impingement. IMPRESSION: 1. 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 No definite cord signal abnormality, but limited by patient motion. 2. Multilevel foraminal stenosis, advanced from C3-4 to C5-6. 3. Dorsal epidural cystic change at C2/C3 is likely degenerative ganglion, less likely hematoma. This does not significantly contribute to the described canal stenosis. Electronically Signed   By: Monte Fantasia M.D.   On: 09/13/2015 10:53     Assessment/Plan: 70 year old male presenting with a history suggestive of cervical spinal stenosis with progressive decline in neurological function.  MRI of the brain reviewed which shows no acute changes.  MRI of the cervical spine shows high grade cervical spinal stenosis at C3-6 with associated cord compression. Further evaluation recommended.    Recommendations: 1.  Patient to be evaluated by ortho/neurosurgery for possible surgical intervention.    Alexis Goodell,  MD Neurology [phone] 09/14/2015, 3:03 PM

## 2015-09-14 NOTE — ED Notes (Signed)
Per care giver   He was seen yesterday  conts to have headache ,neck chest pain and weakness

## 2015-09-14 NOTE — H&P (Signed)
PCP:  Marguerita Merles, MD  Dermatologist [facility] at [place]  Referring provider transfer from [place], reports severe headache   Chief Complaint:  Too weak to walk has a headache  HPI: Benjamin STIKA Sr. is a 70 y.o. male   has a past medical history of Hypertension; Arthritis; and Gout.   Presented with Headache and back of the neck pain as well as overall debility and the pain was on and off for a few months but getting so bad that if he stood up his pain was severe. He has been getting progressively weaker. He reports right arm progressive weakness over past few months. He went to his PCP and had some plain imaging done that showed some degenerative arthritis. But his symptoms got worse and he presented to [facility]. 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 Neurosurgery has been consulted and recommended outpatient follow-up fortunately at this point patient was so fatigued he needed assistance to walk. Continue not to be a safe discharge and the fact that there was no neurosurgeon of blood elements patient was transferred to [facility].  He Denies any fever no chills. Denies any injury. Reports not  eating and drinking well. He reports thirst. Denies any incontinence. Has been constipated.  He has not had much food for pst 3 days as he has been in ER and Hospitalized at [facility].  Consults discussed case with Dr. Arnoldo Morale with NS will see patient in consult tomorrow at this point no indication for emergent operative intervention.    Of note after arrival to [facility] his vitals was significant for persistent bradycardia EKG was obtained showing bigeminy she was started on telemetry. He has not been endorsing any chest pain or shortness of breath. Patient has no prior cardiac history.  Wife states last week his  dermatologist removed some mole and an ulcer from the back of his neck and his leg. They were ruling out cancer. Patient had had some foul discharge from them growth on his ankle. Wife is a concern if it's been infected. 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  Review of Systems:    Pertinent positives include:  fatigue, constipated  Constitutional:  No weight loss, night sweats, Fevers, chills,weight loss  HEENT:  No headaches, Difficulty swallowing,Tooth/dental problems,Sore throat,  No sneezing, itching, ear ache, nasal congestion, post nasal drip,  Cardio-vascular:  No chest pain, Orthopnea, PND, anasarca, dizziness, palpitations.no Bilateral lower extremity swelling  GI:  No heartburn, indigestion, abdominal pain, nausea, vomiting, diarrhea, change in bowel habits, loss of appetite, melena, blood in stool, hematemesis Resp:  no shortness of breath at rest. No dyspnea on exertion, No excess mucus, no productive cough, No non-productive cough, No coughing up of blood.No change in color of mucus.No wheezing. Skin:  no rash or lesions. No jaundice GU:  no dysuria, change in color of urine, no urgency or frequency. No straining to urinate.  No flank pain.  Musculoskeletal:  No joint pain or no joint swelling. No decreased range of motion. No back pain.  Psych:  No change in mood or affect. No depression or anxiety. No memory loss.  Neuro: no localizing neurological complaints, no tingling,  no weakness, no double vision, no gait abnormality, no slurred speech, no confusion  Otherwise ROS are negative except for above, 10 systems were reviewed  Past Medical History: Past Medical History  Diagnosis Date  . Hypertension   . Arthritis   . Gout    History reviewed. No pertinent past surgical  history.   Medications: Prior to Admission medications   Medication Sig Start Date End Date Taking? Authorizing Provider  Acetaminophen (ACETAMIN PO) Take 1,000 mg by mouth as needed.   Yes Historical Provider, MD  atenolol (TENORMIN) 100 MG tablet Take 100 mg by mouth daily.   Yes Historical Provider, MD  diclofenac (VOLTAREN) 75 MG EC tablet Take 75 mg by mouth 2 (two) times daily.   Yes Historical Provider, MD  lisinopril-hydrochlorothiazide (PRINZIDE,ZESTORETIC) 20-12.5 MG tablet Take 2 tablets by mouth daily.   Yes Historical Provider, MD    Allergies:   Allergies  Allergen Reactions  . Codeine Nausea And Vomiting    Social History:  Ambulatory   independently but recently needs assist Lives at home  With family     reports that he has never smoked. He does not have any smokeless tobacco history on file. He reports that he does not use illicit drugs.    Family History: family history includes CAD in his father.    Physical Exam: Patient Vitals for the past 24 hrs:  BP Temp Pulse Resp SpO2  09/14/15 1847 (!) 143/89 mmHg 98.4 F (36.9 C) 66 18 96 %    1. General:  in No Acute distress 2. Psychological: Alert and   Oriented 3. Head/ENT:     Dry Mucous Membranes                          Head Non traumatic, neck supple                          Normal  Dentition 4. SKIN:   decreased Skin turgor,  Skin clean Dry verrucose growth noted on the right ankle, large open wound on the back and the site of prior excision      5. Heart: Regular rate and rhythm no Murmur, Rub or gallop 6. Lungs: Clear to auscultation bilaterally, no wheezes or crackles   7. Abdomen: Soft, non-tender, Non distended 8. Lower extremities: no clubbing, cyanosis, or edema 9. Neurologically Grossly intact, moving all 4 extremities equally 10. MSK: Normal range of motion  body mass index is unknown because there is no weight on file.   Labs on Admission:   Results for orders placed or  performed during the hospital encounter of 09/14/15 (from the past 24 hour(s))  Urinalysis complete, with microscopic ([facility] only)     Status: Abnormal   Collection Time: 09/14/15 10:22 AM  Result Value Ref Range   Color, Urine YELLOW (A) YELLOW   APPearance CLEAR (A) CLEAR   Glucose, UA NEGATIVE NEGATIVE mg/dL   Bilirubin Urine NEGATIVE NEGATIVE   Ketones, ur NEGATIVE NEGATIVE mg/dL   Specific Gravity, Urine 1.016 1.005 - 1.030   Hgb urine dipstick NEGATIVE NEGATIVE   pH 6.0 5.0 - 8.0   Protein, ur NEGATIVE NEGATIVE mg/dL   Nitrite NEGATIVE NEGATIVE   Leukocytes, UA NEGATIVE NEGATIVE   RBC / HPF 0-5 0 - 5 RBC/hpf   WBC, UA 0-5 0 - 5 WBC/hpf   Bacteria, UA NONE SEEN NONE SEEN   Squamous Epithelial / LPF  0-5 (A) NONE SEEN   Mucous PRESENT    Hyaline Casts, UA PRESENT   CBC     Status: None   Collection Time: 09/14/15 10:22 AM  Result Value Ref Range   WBC 8.5 3.8 - 10.6 K/uL   RBC 4.53 4.40 - 5.90 MIL/uL   Hemoglobin 13.4 13.0 - 18.0 g/dL   HCT 40.4 40.0 - 52.0 %   MCV 89.2 80.0 - 100.0 fL   MCH 29.6 26.0 - 34.0 pg   MCHC 33.2 32.0 - 36.0 g/dL   RDW 13.6 11.5 - 14.5 %   Platelets 200 150 - 440 K/uL  Basic metabolic panel     Status: Abnormal   Collection Time: 09/14/15 10:22 AM  Result Value Ref Range   Sodium 128 (L) 135 - 145 mmol/L   Potassium 4.1 3.5 - 5.1 mmol/L   Chloride 92 (L) 101 - 111 mmol/L   CO2 28 22 - 32 mmol/L   Glucose, Bld 96 65 - 99 mg/dL   BUN 26 (H) 6 - 20 mg/dL   Creatinine, Ser 0.67 0.61 - 1.24 mg/dL   Calcium 10.0 8.9 - 10.3 mg/dL   GFR calc non Af Amer >60 >60 mL/min   GFR calc Af Amer >60 >60 mL/min   Anion gap 8 5 - 15    UA no evidence of UTI  No results found for: HGBA1C  Estimated Creatinine Clearance: 94.2 mL/min (by C-G formula based on Cr of 0.67).  BNP (last 3 results) No results for input(s): PROBNP in the last 8760 hours.  Other results:  I have pearsonaly reviewed this: ECG REPORT  Rate: 64  Rhythm: [place] ST&T  Change: inferior q waves QTC 418   There were no vitals filed for this visit.   Cultures: No results found for: [place], [place], CULT, REPTSTATUS   Radiological Exams on Admission: [place] Foreign Body  09/13/2015  CLINICAL DATA:  Metal working/exposure; clearance prior to MRI EXAM: ORBITS FOR FOREIGN BODY - 2 VIEW COMPARISON:  None. FINDINGS: There is no evidence of metallic foreign body within the orbits. No significant bone abnormality identified. IMPRESSION: No evidence of metallic foreign body within the orbits. Electronically Signed   By: Inge Rise M.D.   On: 09/13/2015 09:41   Dg Chest 2 View  09/13/2015  CLINICAL DATA:  Two day history of chest pain EXAM: CHEST  2 VIEW COMPARISON:  None. FINDINGS: There is mild atelectatic change in the right middle lobe. Lungs elsewhere clear. Heart is upper normal in size with pulmonary vascularity within normal limits. No pneumothorax. No adenopathy. There is degenerative change in the thoracic spine. IMPRESSION: Mild right middle lobe region atelectatic change. Lungs elsewhere clear. Heart upper normal in size. Electronically Signed   By: Lowella Grip III M.D.   On: 09/13/2015 09:40   Dg Lumbar Spine Complete  09/13/2015  CLINICAL DATA:  Lumbago EXAM: LUMBAR SPINE - COMPLETE 4+ VIEW COMPARISON:  None. FINDINGS: Frontal, lateral, spot lumbosacral lateral, and bilateral oblique views were obtained. There are 5 non-rib-bearing lumbar type vertebral bodies. There is no fracture or spondylolisthesis. There is moderate disc space narrowing at L2-3, L4-5, and L5-S1 with vacuum phenomenon at L4-5. There is also degenerative change in the visualized lower thoracic regions. There is facet osteoarthritic change to varying degrees at all levels, most notably at L4-5 and L5-S1 bilaterally. There is atherosclerotic calcification in the aorta. IMPRESSION: Multilevel osteoarthritic change.  No fracture or spondylolisthesis. Electronically Signed   By:  Lowella Grip III M.D.   On: 09/13/2015 09:41   Mr Brain Wo Contrast  09/13/2015  CLINICAL DATA:  Right neck pain and tingling into the right arm for 1 week. Right arm weakness. EXAM: MRI HEAD WITHOUT CONTRAST TECHNIQUE: Multiplanar, multiecho pulse sequences of the brain and surrounding structures were obtained without intravenous contrast. COMPARISON:  MRI of the cervical spine 09/13/2015 FINDINGS: Diffusion-weighted images demonstrate no evidence for acute or subacute infarction. No acute hemorrhage or mass lesion is present. The ventricles are of normal size. Mild periventricular rib white matter changes bilaterally are slightly advanced for age. The ventricles are of normal size. 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 Remote lacunar infarcts are present in the thalami bilaterally. No significant extra-axial fluid collection is present. Flow is present in the major intracranial arteries. The globes and orbits are intact. The paranasal sinuses and mastoid air cells are clear. Skullbase is within normal limits. Midline sagittal images demonstrate prominent soft tissue posterior to the dens at C1-2. There some narrowing of the canal at this level. Moderate degenerative changes are also present at C2-3 anterior car greater extent at C3-4. Intracranial midline sagittal images are unremarkable. IMPRESSION: 1. No acute intracranial abnormality. 2. Mild atrophy and white matter changes are somewhat advanced for age. 3. 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 4. 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 Electronically Signed   By: San Morelle M.D.   On: 09/13/2015 11:06   Mr Cervical Spine Wo Contrast  09/13/2015  CLINICAL DATA:  Neck pain for 1 week with tingling in the right arm. EXAM: MRI CERVICAL SPINE WITHOUT CONTRAST TECHNIQUE: Multiplanar, multisequence MR imaging of the cervical spine was performed. No intravenous contrast  was administered. COMPARISON:  None. FINDINGS: No marrow signal abnormality suggestive of fracture, infection, or neoplasm. Extradural hyperintensity intermittently seen posteriorly and from C2 to C3, best visualized on axial gradient imaging in the right posterior canal measuring 4 mm in maximal thickness. This could reflect degenerative cyst formation or a extradural collection/hematoma. This does not significantly contribute to degenerative stenosis. Cord is compressed by degenerative changes at multiple levels as described below. No definite cord edema but limited by motion. Craniocervical junction: Retro dental ligamentum thickening without evidence of erosion. Rotation at the C1-2 articulation with joint effusion on the right. C2-3: Disc: Mild uncovertebral spurring.  No herniation Facets: Arthropathy with probable ankylosis on the left. Canal: Patent.  Epidural cystic change as described above. Foramina: No impingement. C3-4: Disc: Disc narrowing with posterior disc osteophyte complex and uncovertebral spurs. Facets: Severe arthropathy with spurring greater on the left. Ligamentum flavum thickening and buckling Canal: Advanced stenosis with moderate cord compression. No definite cord signal abnormality. Foramina: Bilateral stenosis, particularly advanced on the left C4-5: Disc: Disc narrowing with posterior disc osteophyte complex and bilateral uncovertebral spurs Facets: Advanced bilateral arthropathy with left-sided joint effusion. Bulky ligamentum flavum thickening Canal: Advanced stenosis with moderate cord compression. No definite cord signal abnormality. Foramina: Advanced bilateral stenosis C5-6: Disc: Advanced disc narrowing with posterior disc osteophyte complex and bulky right uncovertebral spurring. Facets: Advanced arthropathy with spurring. Ligamentum flavum thickening. Canal: Moderate stenosis with circumferential effacement of CSF and no cord mass effect. Foramina: Bilateral stenosis, worse on  the right at the inferior left foramen is visible. C6-7: Disc: Disc narrowing with right uncovertebral spurring. Facets: Arthropathy with spurring greater on the left Canal: Mild stenosis, mainly discogenic Foramina: Moderate to advanced stenosis on the right. Open  on the left. C7-T1: Disc: Mild bulging of the uncovered disc Facets: Arthropathy with mild anterolisthesis Canal: Patent. Foramina: No impingement. IMPRESSION: 1. 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 No definite cord signal abnormality, but limited by patient motion. 2. Multilevel foraminal stenosis, advanced from C3-4 to C5-6. 3. Dorsal epidural cystic change at C2/C3 is likely degenerative ganglion, less likely hematoma. This does not significantly contribute to the described canal stenosis. Electronically Signed   By: Monte Fantasia M.D.   On: 09/13/2015 10:53    Chart has been reviewed  Family   at  Bedside  plan of care was discussed with   Wife Benjamin Turner [phone]  Assessment/Plan  71 year old male with history of hypertension presents with months history of right sided weakness now generalized debility was found to be hyponatremic with evidence of likely dehydration secondary to poor by mouth intake,  abnormal EKG and localized infection of the right ankle likely contributing to debility and generalized fatigue  Present on Admission:  . Spinal cord compression ([place]) - chronic discuss her neurosurgeon who will see in consult tomorrow likely will need  Operative intervention in the future but not urgently  . Hyponatremia we'll obtain urine electrolytes give gentle IV fluids and continue to monitor  . Essential hypertension given bradycardia hold atenolol continue lisinopril watch creatinine  . Skin cancer patient will need to follow-up for his dermatologist to obtain results of his biopsy is a clear evidence of superficial infection of the ankle lesion will treat with  doxycycline   . Debility will have PT OT evaluation rehydrate and see if patient needs PT OT as an outpatient at home  . Right arm weakness secondary to chronic cord compression is due to spinal stenosis neurosurgery aware we'll see him in consult tomorrow  . Nonspecific abnormal electrocardiogram (ECG) (EKG) patient is chest pain-free no dyspneabut there is evidence of decreased exercise tolerance  given abnormal EKG obtain echogram unsure of onset will obtain troponin  . Bradycardia hold atenolol monitor on telemetry check TSH and echo Dehydration administrating fluids patient had been nothing by mouth for procedures for quite some time we'll make sure patient is able to eat discuss with neurosurgery and ordered diet  Prophylaxis: SCD  CODE STATUS:  FULL CODE  as per patient    Disposition:   likely will need placement for rehabilitation                           Other plan as per orders.  I have spent a total of 65 min on this admission extra time spent to discuss case with Neurosurgery  [place] 09/14/2015, 10:04 PM    [facility]  Pager [phone]   after 2 AM please page floor coverage PA If 7AM-7PM, please contact the day team taking care of the patient  [email or web address]  Password [email or web address]

## 2015-09-14 NOTE — Progress Notes (Signed)
Pt received from [facility] via [place] and after receiving rept from [place] in ED at [facility]. Pt is alert and oriented x 4. Pt admitted to [facility] for spinal surgery for stenosis per rept given by [facility]. Pt noted to have a flat affect and noted RUE weakness. Neuro check otherwise negative.  Pt states, "right side weak and numb with tingling" which per pt admission is no change from [facility]. Pt states neck pain is "tolerable" at this time. Family on way. VSS. Pt noted to have a recent "skin cancer" removal of posterior L shoulder. Bandaid dry and intact. Pt also noted to have a recent "infection" of right ankle related to "shoe rubbing" and was "operated" on by dermatologist. Bandaid dry and intact to that area as well. No pressure skin issues noted of heels or sacral area. NSL of L hand. MD paged regarding pt's admission to the unit. Pt has a urinal at bedside and repts LBM 2 days ago. BS+x4. Lungs CTA but diminished throughout. Heart rate regular. Will rept all to oncoming shift. MD on way to write orders for pt admission.

## 2015-09-15 ENCOUNTER — Inpatient Hospital Stay (HOSPITAL_COMMUNITY): Payer: Medicare HMO

## 2015-09-15 DIAGNOSIS — R0600 Dyspnea, unspecified: Secondary | ICD-10-CM

## 2015-09-15 DIAGNOSIS — R29898 Other symptoms and signs involving the musculoskeletal system: Secondary | ICD-10-CM

## 2015-09-15 DIAGNOSIS — C4490 Unspecified malignant neoplasm of skin, unspecified: Secondary | ICD-10-CM

## 2015-09-15 DIAGNOSIS — R5381 Other malaise: Secondary | ICD-10-CM

## 2015-09-15 DIAGNOSIS — E871 Hypo-osmolality and hyponatremia: Secondary | ICD-10-CM

## 2015-09-15 DIAGNOSIS — R001 Bradycardia, unspecified: Secondary | ICD-10-CM

## 2015-09-15 DIAGNOSIS — I10 Essential (primary) hypertension: Secondary | ICD-10-CM

## 2015-09-15 DIAGNOSIS — G9520 Unspecified cord compression: Secondary | ICD-10-CM

## 2015-09-15 LAB — TROPONIN I

## 2015-09-15 LAB — PROTIME-INR
INR: 1.13 (ref 0.00–1.49)
Prothrombin Time: 14.7 seconds (ref 11.6–15.2)

## 2015-09-15 LAB — CBC
HCT: 38.4 % — ABNORMAL LOW (ref 39.0–52.0)
HEMOGLOBIN: 12.7 g/dL — AB (ref 13.0–17.0)
MCH: 29.9 pg (ref 26.0–34.0)
MCHC: 33.1 g/dL (ref 30.0–36.0)
MCV: 90.4 fL (ref 78.0–100.0)
PLATELETS: 218 10*3/uL (ref 150–400)
RBC: 4.25 MIL/uL (ref 4.22–5.81)
RDW: 13.2 % (ref 11.5–15.5)
WBC: 7.5 10*3/uL (ref 4.0–10.5)

## 2015-09-15 LAB — COMPREHENSIVE METABOLIC PANEL
ALBUMIN: 3.2 g/dL — AB (ref 3.5–5.0)
ALK PHOS: 55 U/L (ref 38–126)
ALT: 16 U/L — AB (ref 17–63)
AST: 20 U/L (ref 15–41)
Anion gap: 6 (ref 5–15)
BUN: 22 mg/dL — ABNORMAL HIGH (ref 6–20)
CALCIUM: 9.6 mg/dL (ref 8.9–10.3)
CO2: 28 mmol/L (ref 22–32)
CREATININE: 0.83 mg/dL (ref 0.61–1.24)
Chloride: 97 mmol/L — ABNORMAL LOW (ref 101–111)
GFR calc Af Amer: >60 mL/min (ref >60)
GFR calc non Af Amer: >60 mL/min (ref >60)
GLUCOSE: 116 mg/dL — AB (ref 65–99)
Potassium: 3.9 mmol/L (ref 3.5–5.1)
SODIUM: 131 mmol/L — AB (ref 135–145)
Total Bilirubin: 0.6 mg/dL (ref 0.3–1.2)
Total Protein: 6 g/dL — ABNORMAL LOW (ref 6.5–8.1)

## 2015-09-15 LAB — TSH: TSH: 1.007 u[IU]/mL (ref 0.350–4.500)

## 2015-09-15 LAB — SODIUM, URINE, RANDOM: Sodium, Ur: 117 mmol/L

## 2015-09-15 LAB — URINALYSIS, ROUTINE W REFLEX MICROSCOPIC
BILIRUBIN URINE: NEGATIVE
GLUCOSE, UA: NEGATIVE mg/dL
HGB URINE DIPSTICK: NEGATIVE
Ketones, ur: NEGATIVE mg/dL
Leukocytes, UA: NEGATIVE
Nitrite: NEGATIVE
Protein, ur: NEGATIVE mg/dL
SPECIFIC GRAVITY, URINE: 1.012 (ref 1.005–1.030)
pH: 7.5 (ref 5.0–8.0)

## 2015-09-15 LAB — MAGNESIUM: Magnesium: 1.6 mg/dL — ABNORMAL LOW (ref 1.7–2.4)

## 2015-09-15 LAB — PREALBUMIN: PREALBUMIN: 24.7 mg/dL (ref 18–38)

## 2015-09-15 LAB — CREATININE, URINE, RANDOM: Creatinine, Urine: 46.92 mg/dL

## 2015-09-15 LAB — OSMOLALITY, URINE: Osmolality, Ur: 468 mOsm/kg (ref 300–900)

## 2015-09-15 LAB — PHOSPHORUS: Phosphorus: 2.3 mg/dL — ABNORMAL LOW (ref 2.5–4.6)

## 2015-09-15 MED ORDER — MAGNESIUM SULFATE 2 GM/50ML IV SOLN
2.0000 g | Freq: Once | INTRAVENOUS | Status: AC
  Administered 2015-09-16: 2 g via INTRAVENOUS
  Filled 2015-09-15 (×2): qty 50

## 2015-09-15 MED ORDER — SODIUM CHLORIDE 1 G PO TABS
2.0000 g | ORAL_TABLET | Freq: Once | ORAL | Status: AC
  Administered 2015-09-15: 2 g via ORAL
  Filled 2015-09-15: qty 2

## 2015-09-15 MED ORDER — MUPIROCIN CALCIUM 2 % EX CREA
TOPICAL_CREAM | Freq: Every day | CUTANEOUS | Status: DC
  Administered 2015-09-16: 11:00:00 via TOPICAL
  Filled 2015-09-15: qty 15

## 2015-09-15 MED ORDER — SODIUM PHOSPHATE 3 MMOLE/ML IV SOLN
30.0000 mmol | Freq: Once | INTRAVENOUS | Status: AC
  Administered 2015-09-15: 30 mmol via INTRAVENOUS
  Filled 2015-09-15: qty 10

## 2015-09-15 MED ORDER — ENSURE ENLIVE PO LIQD
237.0000 mL | Freq: Two times a day (BID) | ORAL | Status: DC
  Administered 2015-09-15 – 2015-09-16 (×2): 237 mL via ORAL

## 2015-09-15 NOTE — Progress Notes (Signed)
Utilization review completed. Althia Egolf, RN, BSN. 

## 2015-09-15 NOTE — Progress Notes (Signed)
OT Cancellation Note  Patient Details Name: Benjamin Turner. MRN: [id number] DOB: 1945-10-31   Cancelled Treatment:    Reason Eval/Treat Not Completed: Patient declined, no reason specified;Other (comment) (Transport arrived to take pt to echo). Pt declining to work with OT at this time; reports he got out of bed earlier today and walked already. Educated pt on difference between PT and OT; pt continued to decline therapy. Will follow up for OT eval as time allows.  Binnie Kand M.S., OTR/L Pager: [phone]   09/15/2015, 2:51 PM

## 2015-09-15 NOTE — Consult Note (Addendum)
WOC wound consult note Reason for Consult: Consult requested for back and right ankle wound. Wound type: Back wound is a full thickness wound where pt recently had a lesion removed and biopsy performed by dermatology prior to admission.  Right ankle is a full thickness lesion which has not had surgery, but he states the dermatologist did perform a biopsy.  Wound is atypical in appearance and may be consistent with possible squamous cell. Measurement: Left posterior back 3.5X4X.2cm, 78% red, 20% yellow, 2% black, small amt yellow drainage, no odor.   Right ankle 4X4cm with overgrowth above skin level, red and califlour-like in appearance, mod amt tan drainage with some odor. Dressing procedure/placement/frequency: Continue present plan of care with antibiotic ointment to back wound to promote moist healing and foam dressing to protect from further injury.  Aquacel to absorb drainage and provide antimicrobial benefits to right ankle wound, foam dressing to protect.  Pt can resume follow-up with dermatology after discharge. Discussed plan of care with patient and son and he verbalized understanding. Please re-consult if further assistance is needed.  Thank-you,  Julien Girt MSN, [place], [place], [place], [place]

## 2015-09-15 NOTE — Consult Note (Signed)
CC:  Neck pain, weakness  HPI: Benjamin FELIZ Sr. is a 70 y.o. male presenting to [facility] with weeks to months of progressively worsening weakness. He describes primarily right arm weakness, noticing that he has been unable to grasp objects, and has difficulty lifting his arm above his head. He has noted near inability to write with his right hand. He also describes difficulty walking, primarily unsteadiness or imbalance, over the same period of time. More recently, over the last few days, he has felt generalized fatigue, hasn't been eating well.  PMH: Past Medical History  Diagnosis Date  . Hypertension   . Arthritis   . Gout     PSH: History reviewed. No pertinent past surgical history.  SH: Social History  Substance Use Topics  . Smoking status: Never Smoker   . Smokeless tobacco: None  . Alcohol Use: Yes     Comment: rare    MEDS: Prior to Admission medications   Medication Sig Start Date End Date Taking? Authorizing Provider  Acetaminophen (ACETAMIN PO) Take 1,000 mg by mouth as needed.   Yes Historical Provider, MD  atenolol (TENORMIN) 100 MG tablet Take 100 mg by mouth daily.   Yes Historical Provider, MD  diclofenac (VOLTAREN) 75 MG EC tablet Take 75 mg by mouth 2 (two) times daily.   Yes Historical Provider, MD  lisinopril-hydrochlorothiazide (PRINZIDE,ZESTORETIC) 20-12.5 MG tablet Take 2 tablets by mouth daily.   Yes Historical Provider, MD    ALLERGY: Allergies  Allergen Reactions  . Codeine Nausea And Vomiting    ROS: ROS  NEUROLOGIC EXAM: Awake, alert, oriented Memory and concentration grossly intact Speech fluent, appropriate CN grossly intact Motor exam: Upper Extremities Deltoid Bicep Tricep Grip  Right 4-/5 4+/5 4+/5 4/5  Left 5/5 5/5 5/5 5/5   Lower Extremity IP Quad PF DF EHL  Right 5/5 5/5 5/5 5/5 5/5  Left 5/5 5/5 5/5 5/5 5/5   Sensation grossly intact to LT  IMGAING: MRI cspine reviewed which demonstrates maintenance of the cervical  lordosis. There is stenosis primarily at C3-4, C4-5, and C5-6 due primarily to ligamentous hypertrophy with some component of disc bulge.  IMPRESSION: - 70 y.o. male who will likely require posterior cervical laminectomy and lateral mass fusion. He does have hyponatremia, and possibly infected ankle and back wounds. Will need to be medically optimized, wounds healed before operating.  PLAN: - Cont mgmt per primary service - Can f/u with me a few weeks after discharge once his wounds have healed to discuss operative decompression/fusion.  I have discussed the above plan with the patient and his family. They understood our discussion and are agreeable. All questions were answered.

## 2015-09-15 NOTE — Progress Notes (Signed)
  Echocardiogram 2D Echocardiogram has been performed.  Benjamin Turner 09/15/2015, 4:20 PM

## 2015-09-15 NOTE — Progress Notes (Signed)
Initial Nutrition Assessment  DOCUMENTATION CODES:   Not applicable  INTERVENTION:  Provide Ensure Enlive po BID, each supplement provides 350 kcal and 20 grams of protein.  Encourage adequate PO intake.  NUTRITION DIAGNOSIS:   Increased nutrient needs related to  (healing) as evidenced by estimated needs.  GOAL:   Patient will meet greater than or equal to 90% of their needs  MONITOR:   PO intake, Supplement acceptance, Weight trends, Labs, I & O's, Skin  REASON FOR ASSESSMENT:   Consult Assessment of nutrition requirement/status  ASSESSMENT:   70 year old male with history of hypertension presents with months history of right sided weakness now generalized debility was found to be hyponatremic with evidence of likely dehydration secondary to poor by mouth intake, abnormal EKG and localized infection of the right ankle likely contributing to debility and generalized fatigue  During time of visit, pt reported he would rather rest and not partake in RD interview. Family was present at beside. Pt nutrition history was then obtained from family member. He reports pt has been eating fine currently and PTA. No percent meal completion recorded. Family unknown to pt's usual body weight. Family reports pt would be agreeable to nutritional supplements. RD to order.   Unable to complete Nutrition-Focused physical exam at this time. RD to complete at next visit.  Labs: Low magnesium and phosphorous.   Diet Order:  Diet Heart Room service appropriate?: Yes; Fluid consistency:: Thin  Skin:  Wound (see comment) (wound on R ankle and back)  Last BM:  1/17  Height:   Ht Readings from Last 1 Encounters:  09/14/15 5\' 8"  (1.727 m)    Weight:   Wt Readings from Last 1 Encounters:  09/14/15 195 lb (88.451 kg)    Ideal Body Weight:  70 kg  BMI:  Body Mass Index: 29.68 kg/(m^2)  Estimated Nutritional Needs:   Kcal:  1900-2100  Protein:  95-110 grams  Fluid:  1.9 - 2.1  L/day  EDUCATION NEEDS:   No education needs identified at this time  Corrin Parker, MS, RD, LDN Pager # [phone] After hours/ weekend pager # [phone]

## 2015-09-15 NOTE — Progress Notes (Signed)
Patient Demographics  Benjamin Turner, is a 70 y.o. male, DOB - 23-Mar-1946, [id number]  Admit date - 09/14/2015   Admitting Physician Waldemar Dickens, MD  Outpatient Primary MD for the patient is Marguerita Merles, MD  LOS - 1   No chief complaint on file.      Admission HPI/Brief narrative: 70 year old male with past medical history of hypertension, osteoarthritis and gout, presents to [facility] with right upper extremity weakness,MRI of the cervical spine showed high grade degenerative canal stenosis C3-4 and C5-6 with cord compression. Transferred to [facility] for neurosurgery evaluation, as well as workup was significant for hyponatremia, and wound infection at recent biopsy site. Subjective:   Benjamin Turner today has,  No chest pain, No abdominal pain - No Nausea, complaints of headache.  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  Hypomagnesemia/hypophosphatemia - Repeat, recheck in a.m.  Hyponatremia - Likely volume depletion, continue gentle hydration  Skin lesion with recent skin biopsy/removal by dermatology - Patient following with dermatology Dr Allyn Kenner as outpatient, results were still pending as per patient. - Continue with doxycycline for local wound infection.  Debility - PT/OT consult  Bradycardia - Tender to hold atenolol, continue with  telemetry monitor, follow on TSH and echo  Hypertension  - Continue with lisinopril  Code Status: Full  Family Communication: Wife at bedside  Disposition Plan: Home when stable   Procedures  none   Consults   Neuro surgery   Medications  Scheduled Meds: . docusate sodium  100 mg Oral BID  . doxycycline  100 mg Oral Q12H  . enoxaparin (LOVENOX) injection  40 mg Subcutaneous Q24H  . feeding supplement (ENSURE ENLIVE)  237 mL Oral BID BM  . lisinopril  10 mg Oral Daily  . mupirocin cream   Topical Daily  . senna  1 tablet Oral BID  . sodium chloride  3 mL Intravenous Q12H   Continuous Infusions: . sodium chloride 75 mL/hr at 09/14/15 2231   PRN Meds:.acetaminophen **OR** acetaminophen, bisacodyl, HYDROcodone-acetaminophen, milk and molasses, morphine injection, ondansetron **OR** ondansetron (ZOFRAN) IV, polyethylene glycol  DVT Prophylaxis  Lovenox   Lab Results  Component Value Date   PLT 218 09/15/2015    Antibiotics   Anti-infectives    Start     Dose/Rate Route Frequency Ordered Stop   09/14/15 2200  doxycycline (VIBRA-TABS) tablet 100 mg     100 mg Oral Every 12 hours 09/14/15 2125            Objective:   Filed Vitals:   09/14/15 1847 09/14/15 2039 09/15/15 1225  BP: 143/89 156/65 131/64  Pulse: 66 33 59  Temp: 98.4 F (36.9 C) 97.9 F (36.6 C) 97.7 F (  36.5 C)  TempSrc:  Oral   Resp: 18 18 18   SpO2: 96% 96% 96%    Wt Readings from Last 3 Encounters:  09/14/15 88.451 kg (195 lb)  09/13/15 88.451 kg (195 lb)  09/13/15 88.451 kg (195 lb)     Intake/Output Summary (Last 24 hours) at 09/15/15 1614 Last data filed at 09/14/15 2041  Gross per 24 hour  Intake      0 ml  Output    200 ml  Net   -200 ml     Physical Exam  Awake Alert, Oriented X 3, Normal affect [place].AT,PERRAL Supple Neck,No JVD, No cervical lymphadenopathy appriciated.  Symmetrical Chest wall movement, Good air movement bilaterally, CTAB RRR,No Gallops,Rubs or new  Murmurs, No Parasternal Heave +ve B.Sounds, Abd Soft, No tenderness, No organomegaly appriciated, No rebound - guarding or rigidity. No Cyanosis, Clubbing or edema, minimal right upper extremity weakness.   Data Review   Micro Results No results found for this or any previous visit (from the past 240 hour(s)).  Radiology Reports Dg Eye Foreign Body  09/13/2015  CLINICAL DATA:  Metal working/exposure; clearance prior to MRI EXAM: ORBITS FOR FOREIGN BODY - 2 VIEW COMPARISON:  None. FINDINGS: There is no evidence of metallic foreign body within the orbits. No significant bone abnormality identified. IMPRESSION: No evidence of metallic foreign body within the orbits. Electronically Signed   By: Inge Rise M.D.   On: 09/13/2015 09:41   Dg Chest 2 View  09/13/2015  CLINICAL DATA:  Two day history of chest pain EXAM: CHEST  2 VIEW COMPARISON:  None. FINDINGS: There is mild atelectatic change in the right middle lobe. Lungs elsewhere clear. Heart is upper normal in size with pulmonary vascularity within normal limits. No pneumothorax. No adenopathy. There is degenerative change in the thoracic spine. IMPRESSION: Mild right middle lobe region atelectatic change. Lungs elsewhere clear. Heart upper normal in size. Electronically Signed   By: Lowella Grip III M.D.   On: 09/13/2015 09:40   Dg Cervical Spine Complete  09/07/2015  CLINICAL DATA:  Pain for months.  No known injury. EXAM: CERVICAL SPINE - COMPLETE 4+ VIEW COMPARISON:  No prior. FINDINGS: Diffuse osteopenia and degenerative change. C5 through C7 incompletely imaged. No acute bony abnormality identified. Questionable infiltrate versus focal lesion right upper lobe. PA and lateral chest x-ray suggested for further evaluation. IMPRESSION: 1. Diffuse degenerative change cervical spine. No acute abnormality identified. 2. C5 through C7 incompletely imaged. 3. Questionable infiltrate versus focal lesion right upper lobe. PA and lateral chest  x-ray suggested . Electronically Signed   By: Marcello Moores  Register   On: 09/07/2015 10:32   Dg Lumbar Spine Complete  09/13/2015  CLINICAL DATA:  Lumbago EXAM: LUMBAR SPINE - COMPLETE 4+ VIEW COMPARISON:  None. FINDINGS: Frontal, lateral, spot lumbosacral lateral, and bilateral oblique views were obtained. There are 5 non-rib-bearing lumbar type vertebral bodies. There is no fracture or spondylolisthesis. There is moderate disc space narrowing at L2-3, L4-5, and L5-S1 with vacuum phenomenon at L4-5. There is also degenerative change in the visualized lower thoracic regions. There is facet osteoarthritic change to varying degrees at all levels, most notably at L4-5 and L5-S1 bilaterally. There is atherosclerotic calcification in the aorta. IMPRESSION: Multilevel osteoarthritic change.  No fracture or spondylolisthesis. Electronically Signed   By: Lowella Grip III M.D.   On: 09/13/2015 09:41   Mr Brain Wo Contrast  09/13/2015  CLINICAL DATA:  Right neck pain and tingling into the right arm for  1 week. Right arm weakness. EXAM: MRI HEAD WITHOUT CONTRAST TECHNIQUE: Multiplanar, multiecho pulse sequences of the brain and surrounding structures were obtained without intravenous contrast. COMPARISON:  MRI of the cervical spine 09/13/2015 FINDINGS: Diffusion-weighted images demonstrate no evidence for acute or subacute infarction. No acute hemorrhage or mass lesion is present. The ventricles are of normal size. Mild periventricular rib white matter changes bilaterally are slightly advanced for age. The ventricles are of normal size. 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 Remote lacunar infarcts are present in the thalami bilaterally. No significant extra-axial fluid collection is present. Flow is present in the major intracranial arteries. The globes and orbits are intact. The paranasal sinuses and mastoid air cells are clear. Skullbase is within normal limits. Midline sagittal images  demonstrate prominent soft tissue posterior to the dens at C1-2. There some narrowing of the canal at this level. Moderate degenerative changes are also present at C2-3 anterior car greater extent at C3-4. Intracranial midline sagittal images are unremarkable. IMPRESSION: 1. No acute intracranial abnormality. 2. Mild atrophy and white matter changes are somewhat advanced for age. 3. 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 4. 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 Electronically Signed   By: San Morelle M.D.   On: 09/13/2015 11:06   Mr Cervical Spine Wo Contrast  09/13/2015  CLINICAL DATA:  Neck pain for 1 week with tingling in the right arm. EXAM: MRI CERVICAL SPINE WITHOUT CONTRAST TECHNIQUE: Multiplanar, multisequence MR imaging of the cervical spine was performed. No intravenous contrast was administered. COMPARISON:  None. FINDINGS: No marrow signal abnormality suggestive of fracture, infection, or neoplasm. Extradural hyperintensity intermittently seen posteriorly and from C2 to C3, best visualized on axial gradient imaging in the right posterior canal measuring 4 mm in maximal thickness. This could reflect degenerative cyst formation or a extradural collection/hematoma. This does not significantly contribute to degenerative stenosis. Cord is compressed by degenerative changes at multiple levels as described below. No definite cord edema but limited by motion. Craniocervical junction: Retro dental ligamentum thickening without evidence of erosion. Rotation at the C1-2 articulation with joint effusion on the right. C2-3: Disc: Mild uncovertebral spurring.  No herniation Facets: Arthropathy with probable ankylosis on the left. Canal: Patent.  Epidural cystic change as described above. Foramina: No impingement. C3-4: Disc: Disc narrowing with posterior disc osteophyte complex and uncovertebral spurs. Facets: Severe arthropathy with spurring greater on  the left. Ligamentum flavum thickening and buckling Canal: Advanced stenosis with moderate cord compression. No definite cord signal abnormality. Foramina: Bilateral stenosis, particularly advanced on the left C4-5: Disc: Disc narrowing with posterior disc osteophyte complex and bilateral uncovertebral spurs Facets: Advanced bilateral arthropathy with left-sided joint effusion. Bulky ligamentum flavum thickening Canal: Advanced stenosis with moderate cord compression. No definite cord signal abnormality. Foramina: Advanced bilateral stenosis C5-6: Disc: Advanced disc narrowing with posterior disc osteophyte complex and bulky right uncovertebral spurring. Facets: Advanced arthropathy with spurring. Ligamentum flavum thickening. Canal: Moderate stenosis with circumferential effacement of CSF and no cord mass effect. Foramina: Bilateral stenosis, worse on the right at the inferior left foramen is visible. C6-7: Disc: Disc narrowing with right uncovertebral spurring. Facets: Arthropathy with spurring greater on the left Canal: Mild stenosis, mainly discogenic Foramina: Moderate to advanced stenosis on the right. Open on the left. C7-T1: Disc: Mild bulging of the uncovered disc Facets: Arthropathy with mild anterolisthesis Canal: Patent. Foramina: No impingement. IMPRESSION: 1. High-grade degenerative canal stenosis from C3-4 to C5-6 with  cord compression at C3-4 and C4-5. No definite cord signal abnormality, but limited by patient motion. 2. Multilevel foraminal stenosis, advanced from C3-4 to C5-6. 3. Dorsal epidural cystic change at C2/C3 is likely degenerative ganglion, less likely hematoma. This does not significantly contribute to the described canal stenosis. Electronically Signed   By: Monte Fantasia M.D.   On: 09/13/2015 10:53     CBC  Recent Labs Lab 09/13/15 1526 09/14/15 1022 09/14/15 2222 09/15/15 0404  WBC 8.2 8.5 8.1 7.5  HGB 13.5 13.4 12.4* 12.7*  HCT 40.5 40.4 36.7* 38.4*  PLT 198 200 201  218  MCV 89.7 89.2 88.9 90.4  MCH 30.0 29.6 30.0 29.9  MCHC 33.4 33.2 33.8 33.1  RDW 13.4 13.6 12.8 13.2  LYMPHSABS 1.7  --   --   --   MONOABS 0.7  --   --   --   EOSABS 0.2  --   --   --   BASOSABS 0.1  --   --   --     Chemistries   Recent Labs Lab 09/13/15 1526 09/14/15 1022 09/14/15 2222 09/15/15 0404  NA 130* 128*  --  131*  K 3.9 4.1  --  3.9  CL 95* 92*  --  97*  CO2 30 28  --  28  GLUCOSE 100* 96  --  116*  BUN 31* 26*  --  22*  CREATININE 0.66 0.67 0.82 0.83  CALCIUM 10.0 10.0  --  9.6  MG  --   --   --  1.6*  AST 19  --   --  20  ALT 17  --   --  16*  ALKPHOS 59  --   --  55  BILITOT 0.9  --   --  0.6   ------------------------------------------------------------------------------------------------------------------ estimated creatinine clearance is 90.8 mL/min (by C-G formula based on Cr of 0.83). ------------------------------------------------------------------------------------------------------------------ No results for input(s): HGBA1C in the last 72 hours. ------------------------------------------------------------------------------------------------------------------ No results for input(s): CHOL, HDL, LDLCALC, TRIG, CHOLHDL, LDLDIRECT in the last 72 hours. ------------------------------------------------------------------------------------------------------------------  Recent Labs  09/15/15 0404  TSH 1.007   ------------------------------------------------------------------------------------------------------------------ No results for input(s): VITAMINB12, FOLATE, FERRITIN, TIBC, IRON, RETICCTPCT in the last 72 hours.  Coagulation profile  Recent Labs Lab 09/15/15 0404  INR 1.13    No results for input(s): DDIMER in the last 72 hours.  Cardiac Enzymes  Recent Labs Lab 09/13/15 1424 09/15/15 0404 09/15/15 1014  TROPONINI <0.03 <0.03 <0.03    ------------------------------------------------------------------------------------------------------------------ Invalid input(s): POCBNP     Time Spent in minutes   30 minutes   Hareem Surowiec M.D on 09/15/2015 at 4:14 PM  Between 7am to 7pm - Pager - [phone]  After 7pm go to [email or web address] - password [facility]  [facility]   Office  [phone]

## 2015-09-15 NOTE — Evaluation (Signed)
Physical Therapy Evaluation Patient Details Name: Benjamin MCCOWAN Sr. MRN: [id number] DOB: 1946-02-25 Today's Date: 09/15/2015   History of Present Illness  Pt is a 70 y.o. male with C-spine cord compression, tingling down right arm, and dizziness. Pt has other significant PMH of HTN, gout, and arthritis.  Clinical Impression  Pt presents with dizziness and pain in his neck and head. Pt demonstrated gross functional ROM and strength in bilateral LE. Pt ambulated 5 feet from EOB to recliner with RW and +2 min assist for safety. Upon standing, Pt loss balance due to reported dizziness.  PT to follow acutely for deficits listed below.       Follow Up Recommendations Home health PT    Equipment Recommendations  None recommended by PT (Pt's friend reports having access to needed equipment)    Recommendations for Other Services       Precautions / Restrictions Precautions Precautions: Fall Precaution Comments: Pt reports feeling dizzy and off balance especially when moving. Restrictions Weight Bearing Restrictions: No      Mobility  Bed Mobility Overal bed mobility: Needs Assistance Bed Mobility: Rolling Rolling: Min assist         General bed mobility comments: Demonstrated log rolling to relieve pressure off neck and head when moving supine to sit. Pt required verbal and tactile for completion. Pt required min assist to pull trunk upright from sidelying.  Transfers Overall transfer level: Needs assistance Equipment used: Rolling walker (2 wheeled) Transfers: Sit to/from Stand Sit to Stand: Min assist;+2 physical assistance         General transfer comment: Pt requires +2 min assist in transferring sit to stand to help maintain balance. Upon standing, Pt had to check balance and his legs gave way momentarily while gaining his balance due to feeling dizzy.  Ambulation/Gait Ambulation/Gait assistance: Min assist;+2 safety/equipment Ambulation Distance (Feet): 5  Feet Assistive device: Rolling walker (2 wheeled) Gait Pattern/deviations: Step-to pattern;Antalgic;Trunk flexed Gait velocity: decreased Gait velocity interpretation: Below normal speed for age/gender General Gait Details: Pt required verbal cues to walk within the BOS for the walker.          Balance Overall balance assessment: Needs assistance Sitting-balance support: Single extremity supported;Feet supported Sitting balance-Leahy Scale: Fair   Postural control: Posterior lean (due to very kyphotic posture ) Standing balance support: Bilateral upper extremity supported Standing balance-Leahy Scale: Fair Standing balance comment: Pt able to maintain static standing balance with a RW and min assist for safety due to easy LOB due to dizziness.                           09/15/15 1258  Vestibular Assessment  General Observation pt reports he doesn't wear glasses to see, just to read.  Eye alignment at rest seems ok.   Symptom Behavior  Type of Dizziness Imbalance (and spinning)  Frequency of Dizziness when seated or standing, never when lying down, associated with head and neck pain, started when he had his skin CA removed from his upper back.   Duration of Dizziness anytime up and sitting, constant  Aggravating Factors Sit to stand  Relieving Factors Lying supine  Occulomotor Exam  Occulomotor Alignment Normal  Gaze-induced Right beating nystagmus with R gaze  Smooth Pursuits Saccades (seems to frog hop a bit, cannot converge on the left)  Saccades Intact  Vestibulo-Occular Reflex  VOR 1 Head Only (x 1 viewing) (did not preform due to neck pain and decreased neck  ROM)  Auditory  Comments better hearing per gross finger rub test in left ear.   Cognition  Cognition Orientation Level (seemed to have some slow processing and memory issues)  Positional Sensitivities  Supine to Sitting 3 (with reports of nausea)  Rolling Right 0  Positional Sensitivities Comments  Both sitting and standing produced constant dizziness and reports of nausea, neck pain and HA also increased.         Pertinent Vitals/Pain Pain Assessment: 0-10 Pain Score: 8  Pain Location: back of neck and up to back of head Pain Descriptors / Indicators: Aching Pain Intervention(s): Limited activity within patient's tolerance;Monitored during session;Repositioned    Home Living Family/patient expects to be discharged to:: Private residence Living Arrangements: Spouse/significant other Available Help at Discharge: Family Type of Home: Mobile home Home Access: Stairs to enter Entrance Stairs-Rails: Can reach both Entrance Stairs-Number of Steps: 5 Home Layout: One level Home Equipment: Walker - 2 wheels;Cane - single point;Shower seat;Bedside commode Additional Comments: Pt's friend was present during tx and offered the home equipment noted above if upon d/c Pt needs it.     Prior Function Level of Independence: Independent         Comments: Pt reports walking independently prior to admission. Pt reports one fall in the bathroom just prior to admission due to dizziness, but reports this is not his normal.      Hand Dominance   Dominant Hand: Right    Extremity/Trunk Assessment   Upper Extremity Assessment: Defer to OT evaluation     RUE Sensation: decreased light touch     Lower Extremity Assessment: RLE deficits/detail;LLE deficits/detail RLE Deficits / Details: Pt demonstrated gross functional ROM and strength  LLE Deficits / Details: Pt demonstrated gross functional ROM and strength      Cervical / Trunk Assessment: Kyphotic (pronounced forward head )  Communication   Communication: No difficulties  Cognition Arousal/Alertness: Awake/alert Behavior During Therapy: Flat affect Overall Cognitive Status: Impaired/Different from baseline Area of Impairment: Following commands;Awareness;Memory;Attention   Current Attention Level: Sustained   Following  Commands: Follows one step commands with increased time   Awareness: Emergent   General Comments: Difficult to assess the Pt's cognition as baseline is unclear. Pt was able to answer questions concerning his home and how he felt, but he took extra time to respond and sometimes would respond "I don't know". Pt maintained a flat affect throughout session.             Assessment/Plan    PT Assessment Patient needs continued PT services  PT Diagnosis Difficulty walking;Generalized weakness;Acute pain   PT Problem List Decreased strength;Decreased range of motion;Decreased activity tolerance;Decreased balance;Decreased mobility;Decreased knowledge of use of DME;Decreased safety awareness;Pain;Impaired sensation  PT Treatment Interventions DME instruction;Gait training;Functional mobility training;Therapeutic activities;Therapeutic exercise;Balance training;Patient/family education   PT Goals (Current goals can be found in the Care Plan section) Acute Rehab PT Goals Patient Stated Goal: Reduce dizziness and pain  PT Goal Formulation: With patient Time For Goal Achievement: 09/29/15 Potential to Achieve Goals: Fair    Frequency Min 3X/week   Barriers to discharge Inaccessible home environment Pt has not previously used an AD, and will require a RW upon d/c. Pt lives in mobile home, which will make it challenging to have space to maneuver with a RW.       End of Session Equipment Utilized During Treatment: Gait belt Activity Tolerance: Patient limited by fatigue;Patient limited by pain Patient left: in chair;with call bell/phone within reach;with chair  alarm set           Time: [id number] PT Time Calculation (min) (ACUTE ONLY): 32 min   Charges:  1 mod evaluation, 1 gait            [place], [place] office Arelia Sneddon 09/15/2015, 12:52 PM

## 2015-09-16 DIAGNOSIS — R202 Paresthesia of skin: Secondary | ICD-10-CM

## 2015-09-16 LAB — MAGNESIUM: MAGNESIUM: 2.3 mg/dL (ref 1.7–2.4)

## 2015-09-16 LAB — CBC
HCT: 36.5 % — ABNORMAL LOW (ref 39.0–52.0)
Hemoglobin: 12.7 g/dL — ABNORMAL LOW (ref 13.0–17.0)
MCH: 30.8 pg (ref 26.0–34.0)
MCHC: 34.8 g/dL (ref 30.0–36.0)
MCV: 88.4 fL (ref 78.0–100.0)
PLATELETS: 205 10*3/uL (ref 150–400)
RBC: 4.13 MIL/uL — ABNORMAL LOW (ref 4.22–5.81)
RDW: 13.1 % (ref 11.5–15.5)
WBC: 8.3 10*3/uL (ref 4.0–10.5)

## 2015-09-16 LAB — BASIC METABOLIC PANEL
ANION GAP: 6 (ref 5–15)
BUN: 17 mg/dL (ref 6–20)
CALCIUM: 9.4 mg/dL (ref 8.9–10.3)
CO2: 28 mmol/L (ref 22–32)
CREATININE: 0.71 mg/dL (ref 0.61–1.24)
Chloride: 98 mmol/L — ABNORMAL LOW (ref 101–111)
Glucose, Bld: 93 mg/dL (ref 65–99)
Potassium: 4.1 mmol/L (ref 3.5–5.1)
SODIUM: 132 mmol/L — AB (ref 135–145)

## 2015-09-16 LAB — HEMOGLOBIN A1C
HEMOGLOBIN A1C: 5.3 % (ref 4.8–5.6)
MEAN PLASMA GLUCOSE: 105 mg/dL

## 2015-09-16 LAB — PHOSPHORUS: Phosphorus: 3.5 mg/dL (ref 2.5–4.6)

## 2015-09-16 MED ORDER — FLEET ENEMA 7-19 GM/118ML RE ENEM
1.0000 | ENEMA | Freq: Once | RECTAL | Status: AC
  Administered 2015-09-16: 1 via RECTAL
  Filled 2015-09-16: qty 1

## 2015-09-16 MED ORDER — DOXYCYCLINE HYCLATE 100 MG PO TABS: 100.0000 mg | ORAL_TABLET | Freq: Two times a day (BID) | ORAL | Status: DC

## 2015-09-16 MED ORDER — HYDROCODONE-ACETAMINOPHEN 5-325 MG PO TABS: 1.0000 | ORAL_TABLET | Freq: Four times a day (QID) | ORAL | Status: DC | PRN

## 2015-09-16 MED ORDER — POLYETHYLENE GLYCOL 3350 17 G PO PACK
34.0000 g | PACK | Freq: Once | ORAL | Status: AC
  Administered 2015-09-16: 34 g via ORAL
  Filled 2015-09-16: qty 2

## 2015-09-16 MED ORDER — SENNA 8.6 MG PO TABS: 1.0000 | ORAL_TABLET | Freq: Two times a day (BID) | ORAL | Status: DC

## 2015-09-16 MED ORDER — ACETAMINOPHEN 325 MG PO TABS: 650.0000 mg | ORAL_TABLET | Freq: Four times a day (QID) | ORAL | Status: DC | PRN

## 2015-09-16 MED ORDER — DOCUSATE SODIUM 100 MG PO CAPS: 100.0000 mg | ORAL_CAPSULE | Freq: Two times a day (BID) | ORAL | Status: DC

## 2015-09-16 MED ORDER — ENSURE ENLIVE PO LIQD: 237.0000 mL | Freq: Two times a day (BID) | ORAL | Status: DC

## 2015-09-16 MED ORDER — LISINOPRIL 10 MG PO TABS: 10.0000 mg | ORAL_TABLET | Freq: Every day | ORAL | Status: DC

## 2015-09-16 MED ORDER — BISACODYL 5 MG PO TBEC
10.0000 mg | DELAYED_RELEASE_TABLET | Freq: Every day | ORAL | Status: DC | PRN
  Administered 2015-09-16: 10 mg via ORAL
  Filled 2015-09-16: qty 2

## 2015-09-16 MED ORDER — MUPIROCIN CALCIUM 2 % EX CREA: TOPICAL_CREAM | Freq: Every day | CUTANEOUS | Status: DC

## 2015-09-16 MED ORDER — POLYETHYLENE GLYCOL 3350 17 G PO PACK: 17.0000 g | PACK | Freq: Every day | ORAL | Status: DC | PRN

## 2015-09-16 NOTE — Progress Notes (Signed)
Patient is being discharged today. Spoke with the weekend care manager and she stated that she would order the patient home heath PT/OT tomorrow.

## 2015-09-16 NOTE — Evaluation (Signed)
Occupational Therapy Evaluation Patient Details Name: Benjamin DEMMA Sr. MRN: [id number] DOB: [birth year] Today's Date: 09/16/2015    History of Present Illness Pt is a 70 y.o. male with C-spine cord compression, tingling down right arm, and dizziness. Pt has other significant PMH of HTN, gout, and arthritis.   Clinical Impression   Pt reports he was independent with ADLs and mobility PTA. Currently pt is overall min assist for functional mobility and ADLs. Pt reports decreased sensation, strength, and ROM in R UE. Began safety and ADL education with pt and wife. Pt planning to d/c home with 24/7 supervision from his wife. Pt would benefit from continued skilled OT to maximize independence and safety with toilet and tub transfers using 3 in 1 and HEP for fine motor strengthening and coordination of R UE.     Follow Up Recommendations  Home health OT;Supervision/Assistance - 24 hour    Equipment Recommendations  3 in 1 bedside comode    Recommendations for Other Services       Precautions / Restrictions Precautions Precautions: Fall Precaution Comments: Pt reports dizziness is better than yesterday but still present Restrictions Weight Bearing Restrictions: No      Mobility Bed Mobility Overal bed mobility: Needs Assistance Bed Mobility: Rolling;Sidelying to Sit;Sit to Sidelying Rolling: Supervision Sidelying to sit: Supervision     Sit to sidelying: Supervision General bed mobility comments: Overall supervision for safety; no physical assist needed. Pt with good log roll technique. Use or rails and HOB slightly elevated.  Transfers Overall transfer level: Needs assistance Equipment used: Rolling walker (2 wheeled) Transfers: Sit to/from Stand Sit to Stand: Min guard         General transfer comment: Min guard for safety with sit to stand; no physical assist required. Min assist given once standing for balance and support secondary to dizziness and pt being off  balance on feet. VCs for hand placement throughout.    Balance Overall balance assessment: Needs assistance Sitting-balance support: Feet supported;No upper extremity supported Sitting balance-Leahy Scale: Fair     Standing balance support: Bilateral upper extremity supported Standing balance-Leahy Scale: Fair Standing balance comment: RW for support                            ADL Overall ADL's : Needs assistance/impaired Eating/Feeding: Set up;Sitting   Grooming: Minimal assistance;Standing Grooming Details (indicate cue type and reason): Min assist for balance in standing. Upper Body Bathing: Min guard;Sitting   Lower Body Bathing: Minimal assistance;Sit to/from stand   Upper Body Dressing : Min guard;Sitting   Lower Body Dressing: Minimal assistance;Sit to/from stand   Toilet Transfer: Minimal assistance;Ambulation;BSC;RW (BSC over toilet)   Toileting- Clothing Manipulation and Hygiene: Minimal assistance;Sit to/from Nurse, children's Details (indicate cue type and reason): Educated on use of 3 in 1 in tub as a seat. Functional mobility during ADLs: Minimal assistance;Rolling walker General ADL Comments: Pts wife present for OT eval. Educated on safety with RW, home safety, use of 3 in 1 over toilet and in tub. Recommended that pt sit down in shower due to weakness.     Vision     Perception     Praxis      Pertinent Vitals/Pain Pain Assessment: No/denies pain     Hand Dominance Right   Extremity/Trunk Assessment Upper Extremity Assessment Upper Extremity Assessment: RUE deficits/detail RUE Deficits / Details: Limited shoulder AROM (3/4); pt reports pain in elbow  and shoulder with flexion. Pt reports sensation is different on R arm vs L; whole arm affected. Pt reports decreased fine motor and an increase of dropping objects with R hand. Decreased strength overall 3+/5 RUE Sensation: decreased light touch RUE Coordination: decreased fine  motor;decreased gross motor   Lower Extremity Assessment Lower Extremity Assessment: Defer to PT evaluation   Cervical / Trunk Assessment Cervical / Trunk Assessment: Kyphotic   Communication Communication Communication: No difficulties   Cognition Arousal/Alertness: Awake/alert Behavior During Therapy: Flat affect Overall Cognitive Status: Within Functional Limits for tasks assessed                     General Comments       Exercises       Shoulder Instructions      Home Living Family/patient expects to be discharged to:: Private residence Living Arrangements: Spouse/significant other Available Help at Discharge: Family;Available 24 hours/day Type of Home: Mobile home Home Access: Stairs to enter Entrance Stairs-Number of Steps: 5 Entrance Stairs-Rails: Can reach both Home Layout: One level     Bathroom Shower/Tub: Teacher, early years/pre: Standard Bathroom Accessibility: No   Home Equipment: None          Prior Functioning/Environment Level of Independence: Independent             OT Diagnosis: Generalized weakness;Acute pain   OT Problem List: Decreased strength;Decreased activity tolerance;Impaired balance (sitting and/or standing);Decreased safety awareness;Decreased knowledge of use of DME or AE;Decreased knowledge of precautions;Impaired UE functional use;Pain   OT Treatment/Interventions: Self-care/ADL training;Therapeutic exercise;Energy conservation;DME and/or AE instruction;Therapeutic activities;Patient/family education;Balance training    OT Goals(Current goals can be found in the care plan section) Acute Rehab OT Goals Patient Stated Goal: Return to PLOF OT Goal Formulation: With patient/family Time For Goal Achievement: 09/30/15 Potential to Achieve Goals: Good ADL Goals Pt Will Transfer to Toilet: with supervision;ambulating;bedside commode (over toilet) Pt Will Perform Toileting - Clothing Manipulation and hygiene:  with supervision;sit to/from stand Pt Will Perform Tub/Shower Transfer: Tub transfer;ambulating;3 in 1;rolling walker;with supervision Pt/caregiver will Perform Home Exercise Program: Increased strength;Right Upper extremity;With theraputty;With written HEP provided (and fine motor coordination)  OT Frequency: Min 2X/week   Barriers to D/C: Inaccessible home environment  RW will not fit in home.       Co-evaluation              End of Session Equipment Utilized During Treatment: Gait belt;Rolling walker  Activity Tolerance: Other (comment) (Limited by dizziness +nausea) Patient left: in bed;with call bell/phone within reach;with family/visitor present   Time: [id number] OT Time Calculation (min): 18 min Charges:  OT General Charges $OT Visit: 1 Procedure OT Evaluation $OT Eval Moderate Complexity: 1 Procedure G-Codes:     Binnie Kand M.S., OTR/L Pager: [phone]  09/16/2015, 9:56 AM

## 2015-09-16 NOTE — Discharge Summary (Signed)
Benjamin HAYENGA Sr., is a 70 y.o. male  DOB [birth year]  MRN [id number].  Admission date:  09/14/2015  Admitting Physician  Waldemar Dickens, MD  Discharge Date:  09/16/2015   Primary MD  Marguerita Merles, MD  Recommendations for primary care physician for things to follow:  - Please follow with neurosurgery Dr. Kathyrn Sheriff in 2 weeks. - Please check CBC, BMP during next visit - And antihypertensive medication has been adjusted, hydrochlorothiazide and atenolol has been stopped, to continue with lisinopril.   Admission Diagnosis  CERVICAL SPINE STENOSIS   Discharge Diagnosis  CERVICAL SPINE STENOSIS   Active Problems:   Spinal cord compression (HCC)   Paresthesia of arm   Hyponatremia   Essential hypertension   Skin cancer   Debility   Right arm weakness   Nonspecific abnormal electrocardiogram (ECG) (EKG)   Bradycardia      Past Medical History  Diagnosis Date  . Hypertension   . Arthritis   . Gout     History reviewed. No pertinent past surgical history.     History of present illness and  Hospital Course:     Kindly see H&P for history of present illness and admission details, please review complete Labs, Consult reports and Test reports for all details in brief  HPI  from the history and physical done on the day of admission Benjamin SCHAAL Sr. is a 70 y.o. male   has a past medical history of Hypertension; Arthritis; and Gout.   Presented with Headache and back of the neck pain as well as overall debility and the pain was on and off for a few months but getting so bad that if he stood up his pain was severe. He has been getting progressively weaker. He reports right arm progressive weakness over past few months. He went to his PCP and had some plain imaging done that showed some degenerative arthritis. But his symptoms got worse and he presented to [facility]. Where he was  seen in consult by Neurology  MRI brain showed no acute findings MRI of the cervical spine showed high grade degenerative canal stenosis C3-4 and C5-6 with cord compression. Neurosurgery has been consulted and recommended outpatient follow-up fortunately at this point patient was so fatigued he needed assistance to walk. Continue not to be a safe discharge and the fact that there was no neurosurgeon of blood elements patient was transferred to [facility].  He Denies any fever no chills. Denies any injury. Reports not eating and drinking well. He reports thirst. Denies any incontinence. Has been constipated.  He has not had much food for pst 3 days as he has been in ER and Hospitalized at [facility].  Consults discussed case with Dr. Arnoldo Morale with NS will see patient in consult tomorrow at this point no indication for emergent operative intervention.    Of note after arrival to [facility] his vitals was significant for persistent bradycardia EKG was obtained showing bigeminy she was started on telemetry. He has not been  endorsing any chest pain or shortness of breath. Patient has no prior cardiac history.  Wife states last week his dermatologist removed some mole and an ulcer from the back of his neck and his leg. They were ruling out cancer. Patient had had some foul discharge from them growth on his ankle. Wife is a concern if it's been infected. 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   Hospital Course  70 year old male with past medical history of hypertension, osteoarthritis and gout, presents to [facility] with right upper extremity weakness,MRI of the cervical spine showed high grade degenerative canal stenosis C3-4 and C5-6 with cord compression. Transferred to [facility] for neurosurgery  evaluation, as well as workup was significant for hyponatremia, and wound infection at recent biopsy site.  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  Hypomagnesemia/hypophosphatemia - Repeleted, potassium is 4.1, magnesium is 2.3 at day of discharge  Hyponatremia - Likely volume depletion, and hydrochlorothiazide, improved, sodium is 132 at day of discharge, hydrochlorothiazide has been held.  Skin lesion with recent skin biopsy/removal by dermatology - Patient following with dermatology Dr Allyn Kenner as outpatient, results were still pending as per patient. - Continue with doxycycline for local wound infection, and to continue with local wound care as indicated by WOC.  Debility - PT/OT at home  Bradycardia - Normal troponins 3, 2-D echo with normal EF, no regional wall motion abnormality, resolved after holding atenolol, returned on telemetry during hospital stay, atenolol stop on discharge.  Hypertension  - Continue with lisinopril, will continue to hold hydrochlorothiazide and atenolol on discharge    Discharge Condition:  stable   Follow UP  Follow-up Information    Follow up with [facility], NEELESH, C, MD In 2 weeks.   Specialty:  Neurosurgery   Contact information:   [address] [place] [address] [place] [address] [phone]       Follow up with Marguerita Merles, MD. Schedule an appointment as soon as possible for a visit in 1 week.   Specialty:  Family Medicine   Contact information:   [place] [address] [phone]         Discharge Instructions  and  Discharge Medications     Discharge Instructions    Discharge instructions    Complete by:  As directed    Follow with Primary MD Marguerita Merles, MD in 7 days   Get CBC, CMP,  checked  by Primary MD next visit.    Activity: As tolerated with Full fall precautions use walker/cane & assistance as needed   Disposition Home    Diet: Heart Healthy , with feeding assistance and aspiration precautions.  For Heart failure patients - Check your Weight same time everyday, if you gain over 2 pounds, or you develop in leg swelling, experience more shortness of breath or chest pain, call your Primary MD immediately. Follow Cardiac Low Salt Diet and 1.5 lit/day fluid restriction.   On your next visit with your primary care physician please Get Medicines reviewed and adjusted.   Please request your Prim.MD to go over all Hospital Tests  and Procedure/Radiological results at the follow up, please get all Hospital records sent to your Prim MD by signing hospital release before you go home.   If you experience worsening of your admission symptoms, develop shortness of breath, life threatening emergency, suicidal or homicidal thoughts you must seek medical attention immediately by calling 911 or calling your MD immediately  if symptoms less severe.  You Must read complete instructions/literature along with all the possible adverse reactions/side effects for all the Medicines you take and that have been prescribed to you. Take any new Medicines after you have completely understood and accpet all the possible adverse reactions/side effects.   Do not drive, operating heavy machinery, perform activities at heights, swimming or participation in water activities or provide baby sitting services if your were admitted for syncope or siezures until you have seen by Primary MD or a Neurologist and advised to do so again.  Do not drive when taking Pain medications.    Do not take more than prescribed Pain, Sleep and Anxiety Medications  Special Instructions: If you have smoked or chewed Tobacco  in the last 2 yrs please  stop smoking, stop any regular Alcohol  and or any Recreational drug use.  Wear Seat belts while driving.   Please note  You were cared for by a hospitalist during your hospital stay. If you have any questions about your discharge medications or the care you received while you were in the hospital after you are discharged, you can call the unit and asked to speak with the hospitalist on call if the hospitalist that took care of you is not available. Once you are discharged, your primary care physician will handle any further medical issues. Please note that NO REFILLS for any discharge medications will be authorized once you are discharged, as it is imperative that you return to your primary care physician (or establish a relationship with a primary care physician if you do not have one) for your aftercare needs so that they can reassess your need for medications and monitor your lab values.     Increase activity slowly    Complete by:  As directed             Medication List    STOP taking these medications        ACETAMIN PO  Replaced by:  acetaminophen 325 MG tablet     atenolol 100 MG tablet  Commonly known as:  TENORMIN     lisinopril-hydrochlorothiazide 20-12.5 MG tablet  Commonly known as:  PRINZIDE,ZESTORETIC      TAKE these medications        acetaminophen 325 MG tablet  Commonly known as:  TYLENOL  Take 2 tablets (650 mg total) by mouth every 6 (six) hours as needed for mild pain (or Fever >/= 101).     diclofenac 75 MG EC tablet  Commonly known as:  VOLTAREN  Take 75 mg by mouth 2 (two) times daily.     docusate sodium 100 MG capsule  Commonly known as:  COLACE  Take 1 capsule (100 mg total) by mouth 2 (two) times daily.     doxycycline 100 MG tablet  Commonly known as:  VIBRA-TABS  Take 1 tablet (100 mg total) by mouth every 12 (twelve) hours.     feeding supplement (ENSURE ENLIVE) Liqd  Take 237 mLs by mouth 2 (two) times daily between meals.      HYDROcodone-acetaminophen 5-325 MG tablet  Commonly known as:  NORCO/VICODIN  Take 1 tablet by mouth every 6 (six) hours as needed for severe pain.     lisinopril 10 MG tablet  Commonly known as:  PRINIVIL,ZESTRIL  Take 1 tablet (10 mg total) by mouth daily.     mupirocin cream 2 %  Commonly known as:  BACTROBAN  Apply topically daily.     polyethylene glycol packet  Commonly known as:  MIRALAX / GLYCOLAX  Take 17 g by mouth daily as needed for mild constipation.     senna 8.6 MG Tabs tablet  Commonly known as:  SENOKOT  Take 1 tablet (8.6 mg total) by mouth 2 (two) times daily.          Diet and Activity recommendation: See Discharge Instructions above   Consults obtained - neurosurgery   Major procedures and Radiology Reports - PLEASE review detailed and final reports for all details, in brief -      Dg Eye Foreign Body  09/13/2015  CLINICAL DATA:  Metal working/exposure; clearance prior to MRI EXAM: ORBITS FOR FOREIGN BODY - 2 VIEW COMPARISON:  None. FINDINGS: There is no evidence of metallic foreign body within the orbits. No significant bone abnormality identified. IMPRESSION: No evidence of metallic foreign body within the orbits. Electronically Signed   By: Inge Rise M.D.   On: 09/13/2015 09:41   Dg Chest 2 View  09/13/2015  CLINICAL DATA:  Two day history of chest pain EXAM: CHEST  2 VIEW COMPARISON:  None. FINDINGS: There is mild atelectatic change in the right middle lobe. Lungs elsewhere clear. Heart is upper normal in size with pulmonary vascularity within normal limits. No pneumothorax. No adenopathy. There is degenerative change in the thoracic spine. IMPRESSION: Mild right middle lobe region atelectatic change. Lungs elsewhere clear. Heart upper normal in size. Electronically Signed   By: Lowella Grip III M.D.   On: 09/13/2015 09:40   Dg Cervical Spine Complete  09/07/2015  CLINICAL DATA:  Pain for months.  No known injury. EXAM: CERVICAL SPINE -  COMPLETE 4+ VIEW COMPARISON:  No prior. FINDINGS: Diffuse osteopenia and degenerative change. C5 through C7 incompletely imaged. No acute bony abnormality identified. Questionable infiltrate versus focal lesion right upper lobe. PA and lateral chest x-ray suggested for further evaluation. IMPRESSION: 1. Diffuse degenerative change cervical spine. No acute abnormality identified. 2. C5 through C7 incompletely imaged. 3. Questionable infiltrate versus focal lesion right upper lobe. PA and lateral chest x-ray suggested . Electronically Signed   By: Marcello Moores  Register   On: 09/07/2015 10:32   Dg Lumbar Spine Complete  09/13/2015  CLINICAL DATA:  Lumbago EXAM: LUMBAR SPINE - COMPLETE 4+ VIEW COMPARISON:  None. FINDINGS: Frontal, lateral, spot lumbosacral lateral, and bilateral oblique views were obtained. There are 5 non-rib-bearing lumbar type vertebral bodies. There is no fracture or spondylolisthesis. There is moderate disc space narrowing at L2-3, L4-5, and L5-S1 with vacuum phenomenon at L4-5. There is also degenerative change in the visualized lower thoracic regions. There is facet osteoarthritic change to varying degrees at all levels, most notably at L4-5 and L5-S1 bilaterally. There is atherosclerotic calcification in the aorta. IMPRESSION: Multilevel osteoarthritic change.  No fracture or spondylolisthesis. Electronically Signed   By: Lowella Grip III M.D.   On: 09/13/2015 09:41   Mr Brain Wo Contrast  09/13/2015  CLINICAL DATA:  Right neck pain and tingling into the right arm for 1 week. Right arm weakness. EXAM: MRI HEAD WITHOUT CONTRAST TECHNIQUE: Multiplanar, multiecho pulse sequences of the brain and surrounding structures  were obtained without intravenous contrast. COMPARISON:  MRI of the cervical spine 09/13/2015 FINDINGS: Diffusion-weighted images demonstrate no evidence for acute or subacute infarction. No acute hemorrhage or mass lesion is present. The ventricles are of normal size. Mild  periventricular rib white matter changes bilaterally are slightly advanced for age. The ventricles are of normal size. 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 Remote lacunar infarcts are present in the thalami bilaterally. No significant extra-axial fluid collection is present. Flow is present in the major intracranial arteries. The globes and orbits are intact. The paranasal sinuses and mastoid air cells are clear. Skullbase is within normal limits. Midline sagittal images demonstrate prominent soft tissue posterior to the dens at C1-2. There some narrowing of the canal at this level. Moderate degenerative changes are also present at C2-3 anterior car greater extent at C3-4. Intracranial midline sagittal images are unremarkable. IMPRESSION: 1. No acute intracranial abnormality. 2. Mild atrophy and white matter changes are somewhat advanced for age. 3. 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 4. 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 Electronically Signed   By: San Morelle M.D.   On: 09/13/2015 11:06   Mr Cervical Spine Wo Contrast  09/13/2015  CLINICAL DATA:  Neck pain for 1 week with tingling in the right arm. EXAM: MRI CERVICAL SPINE WITHOUT CONTRAST TECHNIQUE: Multiplanar, multisequence MR imaging of the cervical spine was performed. No intravenous contrast was administered. COMPARISON:  None. FINDINGS: No marrow signal abnormality suggestive of fracture, infection, or neoplasm. Extradural hyperintensity intermittently seen posteriorly and from C2 to C3, best visualized on axial gradient imaging in the right posterior canal measuring 4 mm in maximal thickness. This could reflect degenerative cyst formation or a extradural collection/hematoma. This does not significantly contribute to degenerative stenosis. Cord is compressed by degenerative changes at multiple levels as described below. No definite cord edema but limited by  motion. Craniocervical junction: Retro dental ligamentum thickening without evidence of erosion. Rotation at the C1-2 articulation with joint effusion on the right. C2-3: Disc: Mild uncovertebral spurring.  No herniation Facets: Arthropathy with probable ankylosis on the left. Canal: Patent.  Epidural cystic change as described above. Foramina: No impingement. C3-4: Disc: Disc narrowing with posterior disc osteophyte complex and uncovertebral spurs. Facets: Severe arthropathy with spurring greater on the left. Ligamentum flavum thickening and buckling Canal: Advanced stenosis with moderate cord compression. No definite cord signal abnormality. Foramina: Bilateral stenosis, particularly advanced on the left C4-5: Disc: Disc narrowing with posterior disc osteophyte complex and bilateral uncovertebral spurs Facets: Advanced bilateral arthropathy with left-sided joint effusion. Bulky ligamentum flavum thickening Canal: Advanced stenosis with moderate cord compression. No definite cord signal abnormality. Foramina: Advanced bilateral stenosis C5-6: Disc: Advanced disc narrowing with posterior disc osteophyte complex and bulky right uncovertebral spurring. Facets: Advanced arthropathy with spurring. Ligamentum flavum thickening. Canal: Moderate stenosis with circumferential effacement of CSF and no cord mass effect. Foramina: Bilateral stenosis, worse on the right at the inferior left foramen is visible. C6-7: Disc: Disc narrowing with right uncovertebral spurring. Facets: Arthropathy with spurring greater on the left Canal: Mild stenosis, mainly discogenic Foramina: Moderate to advanced stenosis on the right. Open on the left. C7-T1: Disc: Mild bulging of the uncovered disc Facets: Arthropathy with mild anterolisthesis Canal: Patent. Foramina: No impingement. IMPRESSION: 1. 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 No definite cord signal abnormality, but limited by  patient motion. 2. Multilevel foraminal stenosis,  advanced from C3-4 to C5-6. 3. Dorsal epidural cystic change at C2/C3 is likely degenerative ganglion, less likely hematoma. This does not significantly contribute to the described canal stenosis. Electronically Signed   By: Monte Fantasia M.D.   On: 09/13/2015 10:53    Micro Results     No results found for this or any previous visit (from the past 240 hour(s)).     Today   Subjective:   Benjamin Turner today headache much improved,,no chest abdominal pain,  Objective:   Blood pressure 136/70, pulse 92, temperature 98.2 F (36.8 C), temperature source Oral, resp. rate 18, SpO2 96 %.   Intake/Output Summary (Last 24 hours) at 09/16/15 1425 Last data filed at 09/16/15 0905  Gross per 24 hour  Intake   1150 ml  Output      0 ml  Net   1150 ml    Exam Awake Alert, Oriented X 3, Normal affect [place].AT,PERRAL Supple Neck,No JVD, No cervical lymphadenopathy appriciated.  Symmetrical Chest wall movement, Good air movement bilaterally, CTAB RRR,No Gallops,Rubs or new Murmurs, No Parasternal Heave +ve B.Sounds, Abd Soft, No tenderness, No organomegaly appriciated, No rebound - guarding or rigidity. No Cyanosis, Clubbing or edema, minimal right upper extremity weakness.  Data Review   CBC w Diff: Lab Results  Component Value Date   WBC 8.3 09/16/2015   HGB 12.7* 09/16/2015   HCT 36.5* 09/16/2015   PLT 205 09/16/2015   LYMPHOPCT 21 09/13/2015   MONOPCT 9 09/13/2015   EOSPCT 2 09/13/2015   BASOPCT 1 09/13/2015    CMP: Lab Results  Component Value Date   NA 132* 09/16/2015   K 4.1 09/16/2015   CL 98* 09/16/2015   CO2 28 09/16/2015   BUN 17 09/16/2015   CREATININE 0.71 09/16/2015   PROT 6.0* 09/15/2015   ALBUMIN 3.2* 09/15/2015   BILITOT 0.6 09/15/2015   ALKPHOS 55 09/15/2015   AST 20 09/15/2015   ALT 16* 09/15/2015  .   Total Time in preparing paper work, data evaluation and todays exam - 35  minutes  Teryn Boerema M.D on 09/16/2015 at 2:25 PM  [facility]   Office  [phone]

## 2015-09-16 NOTE — Discharge Instructions (Signed)
Follow with Primary MD Marguerita Merles, MD in 7 days   Get CBC, CMP,  checked  by Primary MD next visit.    Activity: As tolerated with Full fall precautions use walker/cane & assistance as needed   Disposition Home    Diet: Heart Healthy , with feeding assistance and aspiration precautions.  For Heart failure patients - Check your Weight same time everyday, if you gain over 2 pounds, or you develop in leg swelling, experience more shortness of breath or chest pain, call your Primary MD immediately. Follow Cardiac Low Salt Diet and 1.5 lit/day fluid restriction.   On your next visit with your primary care physician please Get Medicines reviewed and adjusted.   Please request your Prim.MD to go over all Hospital Tests and Procedure/Radiological results at the follow up, please get all Hospital records sent to your Prim MD by signing hospital release before you go home.   If you experience worsening of your admission symptoms, develop shortness of breath, life threatening emergency, suicidal or homicidal thoughts you must seek medical attention immediately by calling 911 or calling your MD immediately  if symptoms less severe.  You Must read complete instructions/literature along with all the possible adverse reactions/side effects for all the Medicines you take and that have been prescribed to you. Take any new Medicines after you have completely understood and accpet all the possible adverse reactions/side effects.   Do not drive, operating heavy machinery, perform activities at heights, swimming or participation in water activities or provide baby sitting services if your were admitted for syncope or siezures until you have seen by Primary MD or a Neurologist and advised to do so again.  Do not drive when taking Pain medications.    Do not take more than prescribed Pain, Sleep and Anxiety Medications  Special Instructions: If you have smoked or chewed Tobacco  in the last 2 yrs please  stop smoking, stop any regular Alcohol  and or any Recreational drug use.  Wear Seat belts while driving.   Please note  You were cared for by a hospitalist during your hospital stay. If you have any questions about your discharge medications or the care you received while you were in the hospital after you are discharged, you can call the unit and asked to speak with the hospitalist on call if the hospitalist that took care of you is not available. Once you are discharged, your primary care physician will handle any further medical issues. Please note that NO REFILLS for any discharge medications will be authorized once you are discharged, as it is imperative that you return to your primary care physician (or establish a relationship with a primary care physician if you do not have one) for your aftercare needs so that they can reassess your need for medications and monitor your lab values.

## 2015-10-16 ENCOUNTER — Encounter (HOSPITAL_COMMUNITY): Payer: Self-pay | Admitting: Emergency Medicine

## 2015-10-16 ENCOUNTER — Emergency Department (HOSPITAL_COMMUNITY)
Admission: EM | Admit: 2015-10-16 | Discharge: 2015-10-17 | Disposition: A | Discharge status: Home / Self Care | Payer: Medicare HMO | Attending: Emergency Medicine | Admitting: Emergency Medicine

## 2015-10-16 DIAGNOSIS — I10 Essential (primary) hypertension: Secondary | ICD-10-CM | POA: Insufficient documentation

## 2015-10-16 DIAGNOSIS — M1990 Unspecified osteoarthritis, unspecified site: Secondary | ICD-10-CM | POA: Diagnosis not present

## 2015-10-16 DIAGNOSIS — Z792 Long term (current) use of antibiotics: Secondary | ICD-10-CM | POA: Insufficient documentation

## 2015-10-16 DIAGNOSIS — Z79899 Other long term (current) drug therapy: Secondary | ICD-10-CM | POA: Diagnosis not present

## 2015-10-16 DIAGNOSIS — Z791 Long term (current) use of non-steroidal anti-inflammatories (NSAID): Secondary | ICD-10-CM | POA: Insufficient documentation

## 2015-10-16 DIAGNOSIS — R1030 Lower abdominal pain, unspecified: Secondary | ICD-10-CM | POA: Diagnosis present

## 2015-10-16 DIAGNOSIS — K5900 Constipation, unspecified: Secondary | ICD-10-CM | POA: Diagnosis not present

## 2015-10-16 MED ORDER — FLEET ENEMA 7-19 GM/118ML RE ENEM
1.0000 | ENEMA | Freq: Once | RECTAL | Status: AC
  Administered 2015-10-17: 1 via RECTAL

## 2015-10-16 NOTE — ED Provider Notes (Signed)
CSN: [id number]     Arrival date & time 10/16/15  2007 History  By signing my name below, I, Benjamin Turner, attest that this documentation has been prepared under the direction and in the presence of Benjamin Porter, MD at 2335 Electronically Signed: Doran Turner, ED Scribe. 10/17/2015. 2:42 AM.   Chief Complaint  Patient presents with  . Abdominal Pain   The history is provided by the patient. No language interpreter was used.   HPI Comments: Benjamin WHITTED Sr. is a 70 y.o. male with a PMHx of HTN, arthritis, and Gout who presents to the Emergency Department complaining of constipation with associated lower abdominal pain worsening today, PTA. Pt also reports decreased urination. Pt last BM was 5-6 days ago. Pt reports his stool is "hard". Pt tried ginger ale, prune juice, Miralax (1 "small container"), medication prescribed by PCP (last dose 3:30 PM), and OTC stool softner. Pt was seen by PCP one month ago for similar symptoms and had to have an enema. Pt denies any fevers, chills, abdominal distentionor any other symptoms at this time.He had nausea resolved with a nausea pill, but no vomiting.  Pt has been taking hydrocodone since 09/12/15 prescribed by Dr. Waldron [facility]. Benjamin Turner states she thinks he is now on oxycodone.  Pt denies any smoking or alcohol use.    PCP [name]  Past Medical History  Diagnosis Date  . Hypertension   . Arthritis   . Gout    History reviewed. No pertinent past surgical history. Family History  Problem Relation Age of Onset  . CAD Father    Social History  Substance Use Topics  . Smoking status: Never Smoker   . Smokeless tobacco: None  . Alcohol Use: Yes     Comment: rare  lives at home Lives with spouse  Review of Systems  Constitutional: Negative for fever and chills.  HENT: Negative for congestion and facial swelling.   Eyes: Negative for discharge and visual disturbance.  Respiratory: Negative for shortness of breath.   Cardiovascular: Negative  for chest pain and palpitations.  Gastrointestinal: Positive for abdominal pain and constipation. Negative for vomiting and diarrhea.  Musculoskeletal: Negative for myalgias and arthralgias.  Skin: Negative for color change and rash.  Neurological: Negative for tremors, syncope and headaches.  Psychiatric/Behavioral: Negative for confusion and dysphoric mood.  All other systems reviewed and are negative.  Allergies  Codeine  Home Medications   Prior to Admission medications   Medication Sig Start Date End Date Taking? Authorizing Provider  docusate sodium (COLACE) 100 MG capsule Take 1 capsule (100 mg total) by mouth 2 (two) times daily. 09/16/15  Yes [name], MD  HYDROcodone-acetaminophen (NORCO/VICODIN) 5-325 MG tablet Take 1 tablet by mouth every 6 (six) hours as needed for severe pain. 09/16/15  Yes [name], MD  mupirocin cream (BACTROBAN) 2 % Apply topically daily. 09/16/15  Yes [name], MD  polyethylene glycol (MIRALAX / GLYCOLAX) packet Take 17 g by mouth daily as needed for mild constipation. 09/16/15  Yes [name], MD  senna (SENOKOT) 8.6 MG TABS tablet Take 1 tablet (8.6 mg total) by mouth 2 (two) times daily. 09/16/15  Yes [name], MD  acetaminophen (TYLENOL) 325 MG tablet Take 2 tablets (650 mg total) by mouth every 6 (six) hours as needed for mild pain (or Fever >/= 101). 09/16/15   [name], MD  diclofenac (VOLTAREN) 75 MG EC tablet Take 75 mg by mouth 2 (two)  times daily.    Historical Provider, MD  doxycycline (VIBRA-TABS) 100 MG tablet Take 1 tablet (100 mg total) by mouth every 12 (twelve) hours. Patient not taking: Reported on 10/16/2015 09/16/15   [name], MD  feeding supplement, ENSURE ENLIVE, (ENSURE ENLIVE) LIQD Take 237 mLs by mouth 2 (two) times daily between meals. 09/16/15   [name], MD  lisinopril (PRINIVIL,ZESTRIL) 10 MG tablet Take 1 tablet (10 mg total) by mouth daily. 09/16/15   [name], MD   BP 136/83 mmHg  Pulse 82  Temp(Src) 97.5 F (36.4 C) (Oral)  Resp 18  Ht 5\' 8"  (1.727 m)  Wt 187 lb (84.823 kg)  BMI 28.44 kg/m2  SpO2 98%  Vital signs normal     Physical Exam  Constitutional: He is oriented to person, place, and time. He appears well-developed and well-nourished.  Non-toxic appearance. He does not appear ill. No distress.  HENT:  Head: Normocephalic and atraumatic.  Right Ear: External ear normal.  Left Ear: External ear normal.  Nose: Nose normal. No mucosal edema or rhinorrhea.  Mouth/Throat: Oropharynx is clear and moist and mucous membranes are normal. No dental abscesses or uvula swelling.  Eyes: Conjunctivae and EOM are normal. Pupils are equal, round, and reactive to light.  Neck: Normal range of motion and full passive range of motion without pain. Neck supple.  Cardiovascular: Normal rate, regular rhythm and normal heart sounds.  Exam reveals no gallop and no friction rub.   No murmur heard. Pulmonary/Chest: Effort normal and breath sounds normal. No respiratory distress. He has no wheezes. He has no rhonchi. He has no rales. He exhibits no tenderness and no crepitus.  Abdominal: Soft. Normal appearance. He exhibits no distension. Bowel sounds are increased. There is tenderness (diffusely). There is no rebound and no guarding.  Musculoskeletal: Normal range of motion. He exhibits no edema or tenderness.  Moves all extremities well.   Neurological: He is alert and oriented to person, place, and time. He has normal strength. No cranial nerve deficit.  Skin: Skin is warm, dry and intact. No rash noted. No erythema. No pallor.  Psychiatric: He has a normal mood and affect. His speech is normal and behavior is normal. His mood appears not anxious.  Nursing note and vitals reviewed.   ED Course  Procedures   Medications  sodium phosphate (FLEET) 7-19 GM/118ML enema 1 enema (1 enema Rectal Given 10/17/15 0057)     DIAGNOSTIC  STUDIES: Oxygen Saturation is 98% on room air, normal by my interpretation.    COORDINATION OF CARE: 2:42 AM Discussed treatment plan with pt at bedside and pt agreed to plan.  Patient was given an enema with clear return and then some small liquid stool. Patient has MiraLAX at home he can take. Benjamin Turner is to put him on an accelerated regimen of the MiraLAX. We discussed his constipation is from his pain pills.   Review of the [place] reveals he got #20 hydrocodone 5/325 on January 21, #60 oxycodone 5/325 on January 24th, and #60 oxycodone 5/325 on February 16. The oxycodone as prescribed by his PCP.   MDM   Final diagnoses:  Constipation, unspecified constipation type    MEDS OTC miralax  Plan discharge  Benjamin Porter, MD, FACEP   I personally performed the services described in this documentation, which was scribed in my presence. The recorded information has been reviewed and considered.  Benjamin Porter, MD, FACEP    Benjamin Porter, MD  10/17/15 0242 

## 2015-10-16 NOTE — ED Notes (Signed)
Pt states he had not had bowel movement x5 days.

## 2015-10-17 NOTE — ED Notes (Signed)
Patient states that now his hemorrhoids are hurting . States that he has no relief.

## 2015-10-17 NOTE — Discharge Instructions (Signed)
Give him plenty of fluids to drink. Use dulcolax suppositories at bedtime and give him a fleets enema once a day until he has a good BM. Get miralax and put one dose or 17 g in 8 ounces of water,  take 1 dose every 30 minutes for 2-4 hours or until you get good results and then once or twice daily to prevent constipation. Your pain medications are causing the constipation.    Constipation, Adult Constipation is when a person:  Poops (has a bowel movement) less than 3 times a week.  Has a hard time pooping.  Has poop that is dry, hard, or bigger than normal. HOME CARE   Eat foods with a lot of fiber in them. This includes fruits, vegetables, beans, and whole grains such as brown rice.  Avoid fatty foods and foods with a lot of sugar. This includes french fries, hamburgers, cookies, candy, and soda.  If you are not getting enough fiber from food, take products with added fiber in them (supplements).  Drink enough fluid to keep your pee (urine) clear or pale yellow.  Exercise on a regular basis, or as told by your doctor.  Go to the restroom when you feel like you need to poop. Do not hold it.  Only take medicine as told by your doctor. Do not take medicines that help you poop (laxatives) without talking to your doctor first. GET HELP RIGHT AWAY IF:   You have bright red blood in your poop (stool).  Your constipation lasts more than 4 days or gets worse.  You have belly (abdominal) or butt (rectal) pain.  You have thin poop (as thin as a pencil).  You lose weight, and it cannot be explained. MAKE SURE YOU:   Understand these instructions.  Will watch your condition.  Will get help right away if you are not doing well or get worse.   This information is not intended to replace advice given to you by your health care provider. Make sure you discuss any questions you have with your health care provider.   Document Released: 01/29/2008 Document Revised: 09/02/2014 Document  Reviewed: 05/24/2013 Elsevier Interactive Patient Education [employer].

## 2015-10-17 NOTE — ED Notes (Signed)
Pt had no results with saline enema. Pt did however pass a lot of gas.

## 2015-10-19 ENCOUNTER — Encounter (HOSPITAL_BASED_OUTPATIENT_CLINIC_OR_DEPARTMENT_OTHER): Payer: Self-pay | Admitting: *Deleted

## 2015-10-25 ENCOUNTER — Other Ambulatory Visit: Payer: Self-pay | Admitting: Plastic Surgery

## 2015-10-25 DIAGNOSIS — C44701 Unspecified malignant neoplasm of skin of unspecified lower limb, including hip: Secondary | ICD-10-CM

## 2015-10-26 ENCOUNTER — Encounter (HOSPITAL_BASED_OUTPATIENT_CLINIC_OR_DEPARTMENT_OTHER)
Admission: RE | Disposition: A | Discharge status: Home / Self Care | Payer: Self-pay | Source: Ambulatory Visit | Attending: Plastic Surgery

## 2015-10-26 ENCOUNTER — Encounter (HOSPITAL_BASED_OUTPATIENT_CLINIC_OR_DEPARTMENT_OTHER): Payer: Self-pay | Admitting: Anesthesiology

## 2015-10-26 ENCOUNTER — Encounter (HOSPITAL_BASED_OUTPATIENT_CLINIC_OR_DEPARTMENT_OTHER): Payer: Self-pay | Admitting: *Deleted

## 2015-10-26 ENCOUNTER — Ambulatory Visit (HOSPITAL_BASED_OUTPATIENT_CLINIC_OR_DEPARTMENT_OTHER)
Admission: RE | Admit: 2015-10-26 | Discharge: 2015-10-26 | Disposition: A | Discharge status: Home / Self Care | Payer: Medicare HMO | Source: Ambulatory Visit | Attending: Plastic Surgery | Admitting: Plastic Surgery

## 2015-10-26 DIAGNOSIS — Z792 Long term (current) use of antibiotics: Secondary | ICD-10-CM | POA: Insufficient documentation

## 2015-10-26 DIAGNOSIS — Z79899 Other long term (current) drug therapy: Secondary | ICD-10-CM | POA: Diagnosis not present

## 2015-10-26 DIAGNOSIS — M1990 Unspecified osteoarthritis, unspecified site: Secondary | ICD-10-CM | POA: Insufficient documentation

## 2015-10-26 DIAGNOSIS — I10 Essential (primary) hypertension: Secondary | ICD-10-CM | POA: Insufficient documentation

## 2015-10-26 DIAGNOSIS — Z791 Long term (current) use of non-steroidal anti-inflammatories (NSAID): Secondary | ICD-10-CM | POA: Insufficient documentation

## 2015-10-26 DIAGNOSIS — C44722 Squamous cell carcinoma of skin of right lower limb, including hip: Secondary | ICD-10-CM | POA: Diagnosis present

## 2015-10-26 DIAGNOSIS — C44701 Unspecified malignant neoplasm of skin of unspecified lower limb, including hip: Secondary | ICD-10-CM

## 2015-10-26 HISTORY — PX: LESION REMOVAL: SHX5196

## 2015-10-26 HISTORY — DX: Malignant (primary) neoplasm, unspecified: C80.1

## 2015-10-26 SURGERY — MINOR EXCISION OF LESION
Anesthesia: LOCAL | Laterality: Right

## 2015-10-26 MED ORDER — BUPIVACAINE-EPINEPHRINE (PF) 0.25% -1:200000 IJ SOLN
INTRAMUSCULAR | Status: DC | PRN
  Administered 2015-10-26: 9 mL via PERINEURAL

## 2015-10-26 MED ORDER — POLYMYXIN B SULFATE 500000 UNITS IJ SOLR
INTRAMUSCULAR | Status: DC | PRN
  Administered 2015-10-26: 500 mL

## 2015-10-26 MED ORDER — BACITRACIN-NEOMYCIN-POLYMYXIN OINTMENT TUBE
TOPICAL_OINTMENT | CUTANEOUS | Status: DC | PRN
  Administered 2015-10-26: 1 via TOPICAL

## 2015-10-26 SURGICAL SUPPLY — 47 items
BAG DECANTER FOR FLEXI CONT (MISCELLANEOUS) IMPLANT
BLADE HEX COATED 2.75 (ELECTRODE) IMPLANT
BLADE SURG 10 STRL SS (BLADE) IMPLANT
BLADE SURG 15 STRL LF DISP TIS (BLADE) ×1 IMPLANT
BLADE SURG 15 STRL SS (BLADE) ×1
CANISTER SUCT 1200ML W/VALVE (MISCELLANEOUS) IMPLANT
CHLORAPREP W/TINT 26ML (MISCELLANEOUS) IMPLANT
COVER MAYO STAND STRL (DRAPES) IMPLANT
DECANTER SPIKE VIAL GLASS SM (MISCELLANEOUS) ×2 IMPLANT
DERMABOND ADVANCED (GAUZE/BANDAGES/DRESSINGS)
DERMABOND ADVANCED .7 DNX12 (GAUZE/BANDAGES/DRESSINGS) IMPLANT
DRAPE INCISE IOBAN 66X45 STRL (DRAPES) IMPLANT
DRAPE LAPAROTOMY 100X72 PEDS (DRAPES) IMPLANT
DRSG ADAPTIC 3X8 NADH LF (GAUZE/BANDAGES/DRESSINGS) IMPLANT
DRSG EMULSION OIL 3X3 NADH (GAUZE/BANDAGES/DRESSINGS) IMPLANT
DRSG PAD ABDOMINAL 8X10 ST (GAUZE/BANDAGES/DRESSINGS) IMPLANT
ELECT REM PT RETURN 9FT ADLT (ELECTROSURGICAL)
ELECTRODE REM PT RTRN 9FT ADLT (ELECTROSURGICAL) IMPLANT
GAUZE XEROFORM 5X9 LF (GAUZE/BANDAGES/DRESSINGS) ×2 IMPLANT
GLOVE BIO SURGEON STRL SZ 6.5 (GLOVE) ×4 IMPLANT
GLOVE BIOGEL PI IND STRL 7.0 (GLOVE) ×1 IMPLANT
GLOVE BIOGEL PI INDICATOR 7.0 (GLOVE) ×1
GOWN STRL REUS W/ TWL LRG LVL3 (GOWN DISPOSABLE) ×2 IMPLANT
GOWN STRL REUS W/TWL LRG LVL3 (GOWN DISPOSABLE) ×2
NEEDLE PRECISIONGLIDE 27X1.5 (NEEDLE) ×2 IMPLANT
NS IRRIG 1000ML POUR BTL (IV SOLUTION) ×2 IMPLANT
PACK BASIN DAY SURGERY FS (CUSTOM PROCEDURE TRAY) ×2 IMPLANT
PENCIL BUTTON HOLSTER BLD 10FT (ELECTRODE) IMPLANT
SHEET MEDIUM DRAPE 40X70 STRL (DRAPES) IMPLANT
SPONGE LAP 18X18 X RAY DECT (DISPOSABLE) ×2 IMPLANT
STAPLER VISISTAT 35W (STAPLE) IMPLANT
SUCTION FRAZIER HANDLE 10FR (MISCELLANEOUS)
SUCTION TUBE FRAZIER 10FR DISP (MISCELLANEOUS) IMPLANT
SURGILUBE 2OZ TUBE FLIPTOP (MISCELLANEOUS) IMPLANT
SUT MNCRL AB 3-0 PS2 18 (SUTURE) ×2 IMPLANT
SUT MNCRL AB 4-0 PS2 18 (SUTURE) IMPLANT
SUT SILK 4 0 PS 2 (SUTURE) ×2 IMPLANT
SUT VIC AB 3-0 FS2 27 (SUTURE) IMPLANT
SUT VIC AB 5-0 PS2 18 (SUTURE) IMPLANT
SUT VICRYL 4-0 PS2 18IN ABS (SUTURE) IMPLANT
SYR BULB IRRIGATION 50ML (SYRINGE) ×2 IMPLANT
SYR CONTROL 10ML LL (SYRINGE) ×2 IMPLANT
TOWEL OR 17X24 6PK STRL BLUE (TOWEL DISPOSABLE) ×2 IMPLANT
TRAY DSU PREP LF (CUSTOM PROCEDURE TRAY) ×2 IMPLANT
TUBE CONNECTING 20X1/4 (TUBING) ×2 IMPLANT
UNDERPAD 30X30 (UNDERPADS AND DIAPERS) IMPLANT
YANKAUER SUCT BULB TIP NO VENT (SUCTIONS) ×2 IMPLANT

## 2015-10-26 NOTE — Brief Op Note (Signed)
10/26/2015  1:45 PM  PATIENT:  Benjamin Mayer Sr.  70 y.o. male  PRE-OPERATIVE DIAGNOSIS:  RIGHT ANKLE SKIN CANCER  POST-OPERATIVE DIAGNOSIS:  RIGHT ANKLE SKIN CANCER  PROCEDURE:  Procedure(s): EXCISION RIGHT ANKLE SKIN CANCER WITH ACELL PLACEMENT (Right)  SURGEON:  Surgeon(s) and Role:    * Chelsa Stout S Sharlett Lienemann, DO - Primary  PHYSICIAN ASSISTANT: Shawn Rayburn, PA  ASSISTANTS: none   ANESTHESIA:   local  EBL:     BLOOD ADMINISTERED:none  DRAINS: none   LOCAL MEDICATIONS USED:  MARCAINE     SPECIMEN:  Source of Specimen:  right ankle SCC  DISPOSITION OF SPECIMEN:  PATHOLOGY  COUNTS:  YES  TOURNIQUET:  * No tourniquets in log *  DICTATION: .Dragon Dictation  PLAN OF CARE: Discharge to home after PACU  PATIENT DISPOSITION:  PACU - hemodynamically stable.   Delay start of Pharmacological VTE agent (>24hrs) due to surgical blood loss or risk of bleeding: no

## 2015-10-26 NOTE — Interval H&P Note (Signed)
History and Physical Interval Note:  10/26/2015 12:59 PM  Benjamin Mayer Sr.  has presented today for surgery, with the diagnosis of RIGHT ANKLE SKIN CANCER  The various methods of treatment have been discussed with the patient and family. After consideration of risks, benefits and other options for treatment, the patient has consented to  Procedure(s): EXCISION RIGHT ANKLE SKIN CANCER WITH ACELL PLACEMENT (Right) APPLICATION OF ACELL RIGHT ANKLE (Right) as a surgical intervention .  The patient's history has been reviewed, patient examined, no change in status, stable for surgery.  I have reviewed the patient's chart and labs.  Questions were answered to the patient's satisfaction.     Wallace Going

## 2015-10-26 NOTE — Op Note (Signed)
Operative Note   DATE OF OPERATION: 10/26/2015  LOCATION: [place]  SURGICAL DIVISION: Plastic Surgery  PREOPERATIVE DIAGNOSES:  Right ankle squamous cell carcinoma   POSTOPERATIVE DIAGNOSES:  same  PROCEDURE:  Excision of right ankle squamous cell carcinoma 4 x 4.5 cm  SURGEON: Claire Sanger Dillingham, DO  ASSISTANT: Shawn Rayburn, PA  ANESTHESIA:  General.   COMPLICATIONS: None.   INDICATIONS FOR PROCEDURE:  The patient, Benjamin Turner is a 70 y.o. male born on May 16, 1946, is here for treatment of a right lateral ankle squamous cell carcinoma.  He has been dealing with the wound for several months as it has been getting larger. MRN: [id number]  CONSENT:  Informed consent was obtained directly from the patient. Risks, benefits and alternatives were fully discussed. Specific risks including but not limited to bleeding, infection, hematoma, seroma, scarring, pain, infection, contracture, asymmetry, wound healing problems, and need for further surgery were all discussed. The patient did have an ample opportunity to have questions answered to satisfaction.   DESCRIPTION OF PROCEDURE:  The patient was taken to the operating room. SCDs were placed and IV antibiotics were given. The patient's operative site was prepped and draped in a sterile fashion. A time out was performed and all information was confirmed to be correct.  Local anesthesia was administered.  Once the leg area was numb the #10 blade was used to excise the lesion with a 2 cm margin (4 x 4.5 cm excision).  The 4-0 silk was placed short stitch 12 o'clock and long stitch 3 o'clock.  A retention suture was placed with the 3-0 Monocryl.  Hemostasis was achieved with electrocautery.  The patient tolerated the procedure well.  There were no complications. The patient was allowed to wake from anesthesia, extubated and taken to the recovery room in satisfactory condition.

## 2015-10-26 NOTE — Discharge Instructions (Signed)
Triple antibiotic ointment to leg wound daily. May remove dressing starting tomorrow.  Keep elevated as able

## 2015-10-26 NOTE — H&P (Signed)
Benjamin Mayer Sr. is an 70 y.o. male.   Chief Complaint: squamous cell carcinoma HPI: The patient is a 70 yrs old wm here for treatment of a lesion on his right ankle. There area has been present for the past year and getting larger. He has hypertension but is otherwise doing well. Cervical fusion is planned in the near future. The lesion is 3 x 4 cm on the lateral aspect of the right ankle. The biopsy from [facility] in [place] showed squamous cell carcinoma. It is slightly pedunculated and raised. It is red and has some yellow exudate. Nothing seems to make it better and it is getting larger in time. Records from referring doctor reviewed.   Past Medical History  Diagnosis Date  . Hypertension   . Arthritis   . Gout   . Cancer [facility])     History reviewed. No pertinent past surgical history.  Family History  Problem Relation Age of Onset  . CAD Father    Social History:  reports that he has never smoked. He does not have any smokeless tobacco history on file. He reports that he drinks alcohol. He reports that he does not use illicit drugs.  Allergies:  Allergies  Allergen Reactions  . Codeine Nausea And Vomiting    No prescriptions prior to admission    No results found for this or any previous visit (from the past 48 hour(s)). No results found.  Review of Systems  Constitutional: Negative.   HENT: Negative.   Eyes: Negative.   Respiratory: Negative.   Cardiovascular: Negative.   Gastrointestinal: Negative.   Genitourinary: Negative.   Musculoskeletal: Negative.   Skin: Negative.   Neurological: Negative.   Psychiatric/Behavioral: Negative.     Height 5\' 8"  (1.727 m), weight 84.823 kg (187 lb). Physical Exam  Constitutional: He is oriented to person, place, and time. He appears well-developed and well-nourished.  HENT:  Head: Normocephalic and atraumatic.  Eyes: Conjunctivae and EOM are normal. Pupils are equal, round, and reactive to light.   Cardiovascular: Normal rate.   Respiratory: Effort normal.  GI: He exhibits no distension.  Neurological: He is alert and oriented to person, place, and time.  Psychiatric: He has a normal mood and affect. His behavior is normal.     Assessment/Plan Plan for excision of right ankle skin cancer with possible closure versus delay.  Wallace Going, DO 10/26/2015, 7:32 AM

## 2015-10-27 ENCOUNTER — Encounter (HOSPITAL_BASED_OUTPATIENT_CLINIC_OR_DEPARTMENT_OTHER): Payer: Self-pay | Admitting: Plastic Surgery

## 2015-11-01 ENCOUNTER — Other Ambulatory Visit: Payer: Self-pay | Admitting: Plastic Surgery

## 2015-11-01 DIAGNOSIS — S91001A Unspecified open wound, right ankle, initial encounter: Secondary | ICD-10-CM

## 2015-11-03 ENCOUNTER — Encounter (HOSPITAL_BASED_OUTPATIENT_CLINIC_OR_DEPARTMENT_OTHER): Payer: Self-pay | Admitting: *Deleted

## 2015-11-08 ENCOUNTER — Encounter (HOSPITAL_BASED_OUTPATIENT_CLINIC_OR_DEPARTMENT_OTHER): Payer: Self-pay | Admitting: Anesthesiology

## 2015-11-08 ENCOUNTER — Ambulatory Visit (HOSPITAL_BASED_OUTPATIENT_CLINIC_OR_DEPARTMENT_OTHER)
Admission: RE | Admit: 2015-11-08 | Discharge: 2015-11-08 | Disposition: A | Discharge status: Home / Self Care | Payer: Medicare HMO | Source: Ambulatory Visit | Attending: Plastic Surgery | Admitting: Plastic Surgery

## 2015-11-08 ENCOUNTER — Encounter (HOSPITAL_BASED_OUTPATIENT_CLINIC_OR_DEPARTMENT_OTHER): Payer: Self-pay | Admitting: Plastic Surgery

## 2015-11-08 ENCOUNTER — Encounter (HOSPITAL_BASED_OUTPATIENT_CLINIC_OR_DEPARTMENT_OTHER)
Admission: RE | Disposition: A | Discharge status: Home / Self Care | Payer: Self-pay | Source: Ambulatory Visit | Attending: Plastic Surgery

## 2015-11-08 DIAGNOSIS — M1990 Unspecified osteoarthritis, unspecified site: Secondary | ICD-10-CM | POA: Insufficient documentation

## 2015-11-08 DIAGNOSIS — Z7952 Long term (current) use of systemic steroids: Secondary | ICD-10-CM | POA: Diagnosis not present

## 2015-11-08 DIAGNOSIS — Z79899 Other long term (current) drug therapy: Secondary | ICD-10-CM | POA: Diagnosis not present

## 2015-11-08 DIAGNOSIS — Z791 Long term (current) use of non-steroidal anti-inflammatories (NSAID): Secondary | ICD-10-CM | POA: Diagnosis not present

## 2015-11-08 DIAGNOSIS — M216X1 Other acquired deformities of right foot: Secondary | ICD-10-CM | POA: Insufficient documentation

## 2015-11-08 DIAGNOSIS — I10 Essential (primary) hypertension: Secondary | ICD-10-CM | POA: Insufficient documentation

## 2015-11-08 DIAGNOSIS — Z85828 Personal history of other malignant neoplasm of skin: Secondary | ICD-10-CM | POA: Insufficient documentation

## 2015-11-08 HISTORY — DX: Cervicalgia: M54.2

## 2015-11-08 HISTORY — DX: Nausea with vomiting, unspecified: Z98.890

## 2015-11-08 HISTORY — DX: Squamous cell carcinoma of skin, unspecified: C44.92

## 2015-11-08 HISTORY — PX: SKIN GRAFT: SHX250

## 2015-11-08 HISTORY — DX: Other specified postprocedural states: R11.2

## 2015-11-08 SURGERY — MINOR SPLIT THICKNESS SKIN GRAFT
Anesthesia: LOCAL | Site: Ankle | Laterality: Right

## 2015-11-08 MED ORDER — PROPOFOL 500 MG/50ML IV EMUL
INTRAVENOUS | Status: AC
  Filled 2015-11-08: qty 50

## 2015-11-08 MED ORDER — DEXAMETHASONE SODIUM PHOSPHATE 10 MG/ML IJ SOLN
INTRAMUSCULAR | Status: AC
  Filled 2015-11-08: qty 1

## 2015-11-08 MED ORDER — SODIUM CHLORIDE 0.9 % IR SOLN
Status: DC | PRN
  Administered 2015-11-08: 500 mL

## 2015-11-08 MED ORDER — BUPIVACAINE-EPINEPHRINE 0.25% -1:200000 IJ SOLN
INTRAMUSCULAR | Status: DC | PRN
  Administered 2015-11-08: 4 mL

## 2015-11-08 MED ORDER — LIDOCAINE HCL (CARDIAC) 20 MG/ML IV SOLN
INTRAVENOUS | Status: AC
  Filled 2015-11-08: qty 5

## 2015-11-08 MED ORDER — ONDANSETRON HCL 4 MG/2ML IJ SOLN
INTRAMUSCULAR | Status: AC
  Filled 2015-11-08: qty 2

## 2015-11-08 MED ORDER — EPHEDRINE SULFATE 50 MG/ML IJ SOLN
INTRAMUSCULAR | Status: AC
  Filled 2015-11-08: qty 1

## 2015-11-08 MED ORDER — ATROPINE SULFATE 0.4 MG/ML IJ SOLN
INTRAMUSCULAR | Status: AC
  Filled 2015-11-08: qty 1

## 2015-11-08 MED ORDER — PHENYLEPHRINE 40 MCG/ML (10ML) SYRINGE FOR IV PUSH (FOR BLOOD PRESSURE SUPPORT)
PREFILLED_SYRINGE | INTRAVENOUS | Status: AC
  Filled 2015-11-08: qty 10

## 2015-11-08 MED ORDER — SUCCINYLCHOLINE CHLORIDE 20 MG/ML IJ SOLN
INTRAMUSCULAR | Status: AC
  Filled 2015-11-08: qty 1

## 2015-11-08 SURGICAL SUPPLY — 72 items
BAG DECANTER FOR FLEXI CONT (MISCELLANEOUS) ×2 IMPLANT
BANDAGE ACE 3X5.8 VEL STRL LF (GAUZE/BANDAGES/DRESSINGS) IMPLANT
BANDAGE ACE 4X5 VEL STRL LF (GAUZE/BANDAGES/DRESSINGS) ×2 IMPLANT
BANDAGE ACE 6X5 VEL STRL LF (GAUZE/BANDAGES/DRESSINGS) ×2 IMPLANT
BENZOIN TINCTURE PRP APPL 2/3 (GAUZE/BANDAGES/DRESSINGS) IMPLANT
BLADE CLIPPER SURG (BLADE) IMPLANT
BLADE DERMATOME SS (BLADE) IMPLANT
BLADE HEX COATED 2.75 (ELECTRODE) IMPLANT
BLADE SURG 10 STRL SS (BLADE) IMPLANT
BLADE SURG 15 STRL LF DISP TIS (BLADE) ×1 IMPLANT
BLADE SURG 15 STRL SS (BLADE) ×1
BNDG COHESIVE 4X5 TAN STRL (GAUZE/BANDAGES/DRESSINGS) IMPLANT
BNDG GAUZE ELAST 4 BULKY (GAUZE/BANDAGES/DRESSINGS) ×4 IMPLANT
COTTONBALL LRG STERILE PKG (GAUZE/BANDAGES/DRESSINGS) ×4 IMPLANT
COVER BACK TABLE 60X90IN (DRAPES) ×2 IMPLANT
COVER MAYO STAND STRL (DRAPES) ×2 IMPLANT
DECANTER SPIKE VIAL GLASS SM (MISCELLANEOUS) IMPLANT
DERMABOND ADVANCED (GAUZE/BANDAGES/DRESSINGS)
DERMABOND ADVANCED .7 DNX12 (GAUZE/BANDAGES/DRESSINGS) IMPLANT
DERMACARRIERS GRAFT 1 TO 1.5 (DISPOSABLE)
DRAPE EXTREMITY T 121X128X90 (DRAPE) IMPLANT
DRAPE INCISE IOBAN 66X45 STRL (DRAPES) IMPLANT
DRAPE LAPAROTOMY 100X72 PEDS (DRAPES) IMPLANT
DRAPE SURG 17X23 STRL (DRAPES) ×8 IMPLANT
DRAPE U-SHAPE 76X120 STRL (DRAPES) IMPLANT
DRSG ADAPTIC 3X8 NADH LF (GAUZE/BANDAGES/DRESSINGS) IMPLANT
DRSG EMULSION OIL 3X3 NADH (GAUZE/BANDAGES/DRESSINGS) ×2 IMPLANT
DRSG OPSITE 6X11 MED (GAUZE/BANDAGES/DRESSINGS) IMPLANT
DRSG PAD ABDOMINAL 8X10 ST (GAUZE/BANDAGES/DRESSINGS) IMPLANT
DRSG TEGADERM 4X10 (GAUZE/BANDAGES/DRESSINGS) IMPLANT
ELECT REM PT RETURN 9FT ADLT (ELECTROSURGICAL)
ELECTRODE REM PT RTRN 9FT ADLT (ELECTROSURGICAL) IMPLANT
GAUZE SPONGE 4X4 12PLY STRL (GAUZE/BANDAGES/DRESSINGS) ×4 IMPLANT
GAUZE SPONGE 4X4 16PLY XRAY LF (GAUZE/BANDAGES/DRESSINGS) IMPLANT
GAUZE XEROFORM 5X9 LF (GAUZE/BANDAGES/DRESSINGS) ×2 IMPLANT
GLOVE BIO SURGEON STRL SZ 6.5 (GLOVE) ×4 IMPLANT
GOWN STRL REUS W/ TWL LRG LVL3 (GOWN DISPOSABLE) ×1 IMPLANT
GOWN STRL REUS W/TWL LRG LVL3 (GOWN DISPOSABLE) ×1
GRAFT DERMACARRIERS 1 TO 1.5 (DISPOSABLE) IMPLANT
LIQUID BAND (GAUZE/BANDAGES/DRESSINGS) IMPLANT
MATRIX SURGICAL PSM 5X5CM (Tissue) ×2 IMPLANT
NEEDLE PRECISIONGLIDE 27X1.5 (NEEDLE) ×2 IMPLANT
NS IRRIG 1000ML POUR BTL (IV SOLUTION) ×2 IMPLANT
PACK BASIN DAY SURGERY FS (CUSTOM PROCEDURE TRAY) ×2 IMPLANT
PAD CAST 3X4 CTTN HI CHSV (CAST SUPPLIES) IMPLANT
PAD CAST 4YDX4 CTTN HI CHSV (CAST SUPPLIES) IMPLANT
PADDING CAST ABS 4INX4YD NS (CAST SUPPLIES)
PADDING CAST ABS COTTON 4X4 ST (CAST SUPPLIES) IMPLANT
PADDING CAST COTTON 3X4 STRL (CAST SUPPLIES)
PADDING CAST COTTON 4X4 STRL (CAST SUPPLIES)
PENCIL BUTTON HOLSTER BLD 10FT (ELECTRODE) IMPLANT
SHEET MEDIUM DRAPE 40X70 STRL (DRAPES) IMPLANT
SPONGE LAP 18X18 X RAY DECT (DISPOSABLE) ×2 IMPLANT
STAPLER VISISTAT 35W (STAPLE) IMPLANT
STOCKINETTE 4X48 STRL (DRAPES) IMPLANT
STOCKINETTE 6  STRL (DRAPES)
STOCKINETTE 6 STRL (DRAPES) IMPLANT
STOCKINETTE IMPERVIOUS LG (DRAPES) IMPLANT
STRIP CLOSURE SKIN 1/2X4 (GAUZE/BANDAGES/DRESSINGS) IMPLANT
SURGILUBE 2OZ TUBE FLIPTOP (MISCELLANEOUS) IMPLANT
SUT MON AB 5-0 PS2 18 (SUTURE) IMPLANT
SUT SILK 3 0 SH CR/8 (SUTURE) IMPLANT
SUT SILK 4 0 SH CR/8 (SUTURE) IMPLANT
SUT VIC AB 3-0 FS2 27 (SUTURE) IMPLANT
SUT VIC AB 5-0 P-3 18X BRD (SUTURE) IMPLANT
SUT VIC AB 5-0 P3 18 (SUTURE)
SUT VICRYL 4-0 PS2 18IN ABS (SUTURE) IMPLANT
SYR BULB 3OZ (MISCELLANEOUS) ×2 IMPLANT
SYR CONTROL 10ML LL (SYRINGE) ×2 IMPLANT
TOWEL OR 17X24 6PK STRL BLUE (TOWEL DISPOSABLE) ×2 IMPLANT
TRAY DSU PREP LF (CUSTOM PROCEDURE TRAY) ×2 IMPLANT
UNDERPAD 30X30 (UNDERPADS AND DIAPERS) IMPLANT

## 2015-11-08 NOTE — Interval H&P Note (Signed)
History and Physical Interval Note:  11/08/2015 8:15 AM  Benjamin Mayer Sr.  has presented today for surgery, with the diagnosis of RIGHT ANKLE WOUND  The various methods of treatment have been discussed with the patient and family. After consideration of risks, benefits and other options for treatment, the patient has consented to  Procedure(s): SPLIT THICKNESS SKIN GRAFT RIGHT ANKLE  (Right) as a surgical intervention .  The patient's history has been reviewed, patient examined, no change in status, stable for surgery.  I have reviewed the patient's chart and labs.  Questions were answered to the patient's satisfaction.     Wallace Going

## 2015-11-08 NOTE — H&P (View-Only) (Signed)
Benjamin Mayer Sr. is an 70 y.o. male.   Chief Complaint: squamous cell carcinoma HPI: The patient is a 70 yrs old wm here for treatment of a lesion on his right ankle. There area has been present for the past year and getting larger. He has hypertension but is otherwise doing well. Cervical fusion is planned in the near future. The lesion is 3 x 4 cm on the lateral aspect of the right ankle. The biopsy from [facility] in [place] showed squamous cell carcinoma. It is slightly pedunculated and raised. It is red and has some yellow exudate. Nothing seems to make it better and it is getting larger in time. Records from referring doctor reviewed.   Past Medical History  Diagnosis Date  . Hypertension   . Arthritis   . Gout   . Cancer [facility])     History reviewed. No pertinent past surgical history.  Family History  Problem Relation Age of Onset  . CAD Father    Social History:  reports that he has never smoked. He does not have any smokeless tobacco history on file. He reports that he drinks alcohol. He reports that he does not use illicit drugs.  Allergies:  Allergies  Allergen Reactions  . Codeine Nausea And Vomiting    No prescriptions prior to admission    No results found for this or any previous visit (from the past 48 hour(s)). No results found.  Review of Systems  Constitutional: Negative.   HENT: Negative.   Eyes: Negative.   Respiratory: Negative.   Cardiovascular: Negative.   Gastrointestinal: Negative.   Genitourinary: Negative.   Musculoskeletal: Negative.   Skin: Negative.   Neurological: Negative.   Psychiatric/Behavioral: Negative.     Height 5\' 8"  (1.727 m), weight 84.823 kg (187 lb). Physical Exam  Constitutional: He is oriented to person, place, and time. He appears well-developed and well-nourished.  HENT:  Head: Normocephalic and atraumatic.  Eyes: Conjunctivae and EOM are normal. Pupils are equal, round, and reactive to light.   Cardiovascular: Normal rate.   Respiratory: Effort normal.  GI: He exhibits no distension.  Neurological: He is alert and oriented to person, place, and time.  Psychiatric: He has a normal mood and affect. His behavior is normal.     Assessment/Plan Plan for excision of right ankle skin cancer with possible closure versus delay.  Wallace Going, DO 10/26/2015, 7:32 AM

## 2015-11-08 NOTE — Discharge Instructions (Signed)
Keep foot dressing in place and don't change KY gel to right thigh daily.

## 2015-11-08 NOTE — Op Note (Signed)
Operative Note   DATE OF OPERATION: 11/08/2015  LOCATION: [place]  SURGICAL DIVISION: Plastic Surgery  PREOPERATIVE DIAGNOSES:  Right ankle skin wound from skin cancer  POSTOPERATIVE DIAGNOSES:  same  PROCEDURE:  Split thickness skin graft 5 x 5 cm to right ankle wound.  Placement of ACell (5 x 5 cm ) sheet to donor site.  SURGEON: Claire Sanger Dillingham, DO  ASSISTANT: Shawn Rayburn, PA  ANESTHESIA:  General.   COMPLICATIONS: None.   INDICATIONS FOR PROCEDURE:  The patient, Benjamin Turner is a 70 y.o. male born on Nov 09, 1945, is here for treatment of a wound after excision of a skin cancer on the right ankle. MRN: [id number]  CONSENT:  Informed consent was obtained directly from the patient. Risks, benefits and alternatives were fully discussed. Specific risks including but not limited to bleeding, infection, hematoma, seroma, scarring, pain, infection, contracture, asymmetry, wound healing problems, and need for further surgery were all discussed. The patient did have an ample opportunity to have questions answered to satisfaction.   DESCRIPTION OF PROCEDURE:  The patient was taken to the operating room.  The patient's operative site was prepped and draped in a sterile fashion. A time out was performed and all information was confirmed to be correct.   Local was injected at the donor site of the right thigh. The dermatome was set at 07/999 inch and the graft taken from the right thigh.  The ACell 5 x 5 cm sheet was sutured to the donor site, covered by adaptic and ky gel.  A sterile dressing was applied.  Attention was then turned to the right ankle.  The 5 x 5 cm wound was irrigated with antibiotic solution.  The graft was applied and sutured in place 5-0 and 4-0 Vicryl with bolster type sutures.  A xeroform with cotton was applied and sutured in place.  The leg was wrapped with kerlex and placed in a splint plantar for decrease movement of the graft  site. The patient tolerated the procedure well.  There were no complications. The patient was taken to the recovery room in satisfactory condition.

## 2015-11-08 NOTE — Brief Op Note (Signed)
11/08/2015  11:55 AM  PATIENT:  Benjamin Mayer Sr.  70 y.o. male  PRE-OPERATIVE DIAGNOSIS:  RIGHT ANKLE WOUND from Skin Cancer  POST-OPERATIVE DIAGNOSIS:  Same  PROCEDURE:  Procedure(s): SPLIT THICKNESS SKIN GRAFT RIGHT ANKLE  (Right)  SURGEON:  Surgeon(s) and Role:    * Loel Lofty Shamera Yarberry, DO - Primary  PHYSICIAN ASSISTANT: Shawn Rayburn, PA  ASSISTANTS: none   ANESTHESIA:   general  EBL:     BLOOD ADMINISTERED:none  DRAINS: none   LOCAL MEDICATIONS USED:  MARCAINE     SPECIMEN:  No Specimen  DISPOSITION OF SPECIMEN:  N/A  COUNTS:  YES  TOURNIQUET:  * No tourniquets in log *  DICTATION: .Dragon Dictation  PLAN OF CARE: Discharge to home after PACU  PATIENT DISPOSITION:  PACU - hemodynamically stable.   Delay start of Pharmacological VTE agent (>24hrs) due to surgical blood loss or risk of bleeding: no

## 2015-11-10 ENCOUNTER — Encounter (HOSPITAL_BASED_OUTPATIENT_CLINIC_OR_DEPARTMENT_OTHER): Payer: Self-pay | Admitting: Plastic Surgery

## 2015-11-14 ENCOUNTER — Encounter (HOSPITAL_BASED_OUTPATIENT_CLINIC_OR_DEPARTMENT_OTHER): Payer: Self-pay | Admitting: Plastic Surgery

## 2015-12-03 ENCOUNTER — Emergency Department: Payer: Medicare HMO

## 2015-12-03 ENCOUNTER — Emergency Department
Admission: EM | Admit: 2015-12-03 | Discharge: 2015-12-03 | Disposition: A | Discharge status: Home / Self Care | Payer: Medicare HMO | Attending: Emergency Medicine | Admitting: Emergency Medicine

## 2015-12-03 DIAGNOSIS — I10 Essential (primary) hypertension: Secondary | ICD-10-CM | POA: Insufficient documentation

## 2015-12-03 DIAGNOSIS — R1030 Lower abdominal pain, unspecified: Secondary | ICD-10-CM | POA: Diagnosis present

## 2015-12-03 DIAGNOSIS — K5900 Constipation, unspecified: Secondary | ICD-10-CM | POA: Diagnosis not present

## 2015-12-03 DIAGNOSIS — Z79899 Other long term (current) drug therapy: Secondary | ICD-10-CM | POA: Insufficient documentation

## 2015-12-03 DIAGNOSIS — M1990 Unspecified osteoarthritis, unspecified site: Secondary | ICD-10-CM | POA: Insufficient documentation

## 2015-12-03 DIAGNOSIS — R110 Nausea: Secondary | ICD-10-CM

## 2015-12-03 DIAGNOSIS — E871 Hypo-osmolality and hyponatremia: Secondary | ICD-10-CM

## 2015-12-03 LAB — URINALYSIS COMPLETE WITH MICROSCOPIC (ARMC ONLY)
Bacteria, UA: NONE SEEN
Bilirubin Urine: NEGATIVE
Glucose, UA: NEGATIVE mg/dL
HGB URINE DIPSTICK: NEGATIVE
Ketones, ur: NEGATIVE mg/dL
Leukocytes, UA: NEGATIVE
Nitrite: NEGATIVE
PH: 7 (ref 5.0–8.0)
Protein, ur: NEGATIVE mg/dL
RBC / HPF: NONE SEEN RBC/hpf (ref 0–5)
SQUAMOUS EPITHELIAL / LPF: NONE SEEN
Specific Gravity, Urine: 1.015 (ref 1.005–1.030)
WBC UA: NONE SEEN WBC/hpf (ref 0–5)

## 2015-12-03 LAB — COMPREHENSIVE METABOLIC PANEL
ALBUMIN: 4 g/dL (ref 3.5–5.0)
ALT: 46 U/L (ref 17–63)
AST: 23 U/L (ref 15–41)
Alkaline Phosphatase: 82 U/L (ref 38–126)
Anion gap: 8 (ref 5–15)
BUN: 17 mg/dL (ref 6–20)
CHLORIDE: 92 mmol/L — AB (ref 101–111)
CO2: 28 mmol/L (ref 22–32)
CREATININE: 0.74 mg/dL (ref 0.61–1.24)
Calcium: 10.3 mg/dL (ref 8.9–10.3)
GFR calc non Af Amer: >60 mL/min (ref >60)
GLUCOSE: 107 mg/dL — AB (ref 65–99)
Potassium: 3.4 mmol/L — ABNORMAL LOW (ref 3.5–5.1)
SODIUM: 128 mmol/L — AB (ref 135–145)
Total Bilirubin: 0.6 mg/dL (ref 0.3–1.2)
Total Protein: 7.2 g/dL (ref 6.5–8.1)

## 2015-12-03 LAB — CBC
HCT: 37.9 % — ABNORMAL LOW (ref 40.0–52.0)
HEMOGLOBIN: 12.8 g/dL — AB (ref 13.0–18.0)
MCH: 29.9 pg (ref 26.0–34.0)
MCHC: 33.8 g/dL (ref 32.0–36.0)
MCV: 88.5 fL (ref 80.0–100.0)
PLATELETS: 259 10*3/uL (ref 150–440)
RBC: 4.28 MIL/uL — AB (ref 4.40–5.90)
RDW: 14.1 % (ref 11.5–14.5)
WBC: 7.1 10*3/uL (ref 3.8–10.6)

## 2015-12-03 LAB — LIPASE, BLOOD: LIPASE: 18 U/L (ref 11–51)

## 2015-12-03 MED ORDER — IOPAMIDOL (ISOVUE-300) INJECTION 61%: 100.0000 mL | Freq: Once | INTRAVENOUS | Status: DC | PRN

## 2015-12-03 MED ORDER — ONDANSETRON HCL 4 MG/2ML IJ SOLN
4.0000 mg | Freq: Once | INTRAMUSCULAR | Status: AC
  Administered 2015-12-03: 4 mg via INTRAVENOUS
  Filled 2015-12-03: qty 2

## 2015-12-03 MED ORDER — ONDANSETRON 4 MG PO TBDP: 4.0000 mg | ORAL_TABLET | Freq: Three times a day (TID) | ORAL | Status: DC | PRN

## 2015-12-03 MED ORDER — SODIUM CHLORIDE 0.9 % IV BOLUS (SEPSIS)
1000.0000 mL | Freq: Once | INTRAVENOUS | Status: AC
  Administered 2015-12-03: 1000 mL via INTRAVENOUS

## 2015-12-03 MED ORDER — HYDROMORPHONE HCL 1 MG/ML IJ SOLN
1.0000 mg | Freq: Once | INTRAMUSCULAR | Status: AC
  Administered 2015-12-03: 1 mg via INTRAVENOUS
  Filled 2015-12-03: qty 1

## 2015-12-03 MED ORDER — FENTANYL CITRATE (PF) 100 MCG/2ML IJ SOLN
50.0000 ug | Freq: Once | INTRAMUSCULAR | Status: AC
  Administered 2015-12-03: 50 ug via INTRAVENOUS
  Filled 2015-12-03: qty 2

## 2015-12-03 MED ORDER — DIATRIZOATE MEGLUMINE & SODIUM 66-10 % PO SOLN
15.0000 mL | Freq: Once | ORAL | Status: AC
  Administered 2015-12-03: 15 mL via ORAL
  Filled 2015-12-03: qty 30

## 2015-12-03 MED ORDER — POTASSIUM CHLORIDE CRYS ER 20 MEQ PO TBCR
40.0000 meq | EXTENDED_RELEASE_TABLET | Freq: Once | ORAL | Status: AC
  Administered 2015-12-03: 40 meq via ORAL
  Filled 2015-12-03: qty 2

## 2015-12-03 NOTE — ED Provider Notes (Signed)
[facility] Emergency Department Provider Note  ____________________________________________  Time seen: Approximately 11:43 AM  I have reviewed the triage vital signs and the nursing notes.   HISTORY  Chief Complaint Abdominal Pain and Back Pain    HPI Benjamin RICCIARDELLI Sr. is a 70 y.o. male with a history of HTN presenting with 2 weeks of abdominal pain, worse today. The patient has had multiple visits to his primary care physician over the past week for generalized abdominal pain that is worse in the lower part of the abdomen and in the upper pelvis. The etiology of his pain has been unclear and he has a referral for a GI specialist. The pain is worse when he bears down. He had 2 days of constipation, so his wife gave him a suppository yesterday and he did produce a bowel movement after that. He has had nausea without vomiting. No fever but does have occasional chills. No urinary symptoms. No black or tarry stools; has never had a colonoscopy.   Past Medical History  Diagnosis Date  . Hypertension   . Arthritis   . Gout   . Cancer ([place])   . Cervical pain (neck)   . Squamous cell skin cancer     right ankle  . PONV (postoperative nausea and vomiting)     Patient Active Problem List   Diagnosis Date Noted  . Spinal cord compression ([place]) 09/14/2015  . Paresthesia of arm 09/14/2015  . Hyponatremia 09/14/2015  . Essential hypertension 09/14/2015  . Skin cancer 09/14/2015  . Debility 09/14/2015  . Right arm weakness 09/14/2015  . Nonspecific abnormal electrocardiogram (ECG) (EKG) 09/14/2015  . Bradycardia 09/14/2015    Past Surgical History  Procedure Laterality Date  . Lesion removal Right 10/26/2015    Procedure: EXCISION RIGHT ANKLE SKIN ;  Surgeon: Wallace Going, DO;  Location: [place];  Service: Plastics;  Laterality: Right;  . Skin graft Right 11/08/2015    Procedure: IRRIGATION AND DEBRIDEMENT AND SPLIT THICKNESS SKIN  GRAFT RIGHT ANKLE ;  Surgeon: Wallace Going, DO;  Location: [place];  Service: Plastics;  Laterality: Right;    Current Outpatient Rx  Name  Route  Sig  Dispense  Refill  . acetaminophen (TYLENOL) 325 MG tablet   Oral   Take 2 tablets (650 mg total) by mouth every 6 (six) hours as needed for mild pain (or Fever >/= 101).         Marland Kitchen amLODipine (NORVASC) 5 MG tablet   Oral   Take 5 mg by mouth daily.         . diclofenac (VOLTAREN) 75 MG EC tablet   Oral   Take 75 mg by mouth 2 (two) times daily.         . feeding supplement, ENSURE ENLIVE, (ENSURE ENLIVE) LIQD   Oral   Take 237 mLs by mouth 2 (two) times daily between meals.   237 mL   12   . fluticasone (VERAMYST) 27.5 MCG/SPRAY nasal spray   Nasal   Place 2 sprays into the nose daily.         Marland Kitchen lisinopril-hydrochlorothiazide (PRINZIDE,ZESTORETIC) 20-12.5 MG tablet   Oral   Take 2 tablets by mouth daily.         . ondansetron (ZOFRAN) 4 MG tablet   Oral   Take 1 tablet by mouth every 6 (six) hours as needed.         Marland Kitchen oxyCODONE-acetaminophen (PERCOCET/ROXICET) 5-325 MG  tablet   Oral   Take 1 tablet by mouth every 8 (eight) hours as needed for severe pain.         Marland Kitchen ondansetron (ZOFRAN ODT) 4 MG disintegrating tablet   Oral   Take 1 tablet (4 mg total) by mouth every 8 (eight) hours as needed for nausea or vomiting.   10 tablet   0     Allergies Codeine  Family History  Problem Relation Age of Onset  . CAD Father     Social History Social History  Substance Use Topics  . Smoking status: Never Smoker   . Smokeless tobacco: None  . Alcohol Use: Yes     Comment: rare    Review of Systems Constitutional: No fever or positive chills. Eyes: No visual changes. ENT: No sore throat. No congestion or rhinorrhea. Cardiovascular: Denies chest pain. Denies palpitations. Respiratory: Denies shortness of breath.  No cough. Gastrointestinal: Positive abdominal pain.  Positive  nausea, no vomiting.  No diarrhea.  Positive constipation. Genitourinary: Negative for dysuria. No testicular or scrotal pain. Musculoskeletal: Negative for back pain. Skin: Negative for rash. Neurological: Negative for headaches. No focal numbness, tingling or weakness.   10-point ROS otherwise negative.  ____________________________________________   PHYSICAL EXAM:  VITAL SIGNS: ED Triage Vitals  Enc Vitals Group     BP 12/03/15 1033 123/74 mmHg     Pulse Rate 12/03/15 1033 105     Resp 12/03/15 1033 20     Temp 12/03/15 1033 97.9 F (36.6 C)     Temp Source 12/03/15 1033 Oral     SpO2 12/03/15 1033 97 %     Weight 12/03/15 1033 180 lb (81.647 kg)     Height 12/03/15 1033 5\' 8"  (1.727 m)     Head Cir --      Peak Flow --      Pain Score 12/03/15 1034 10     Pain Loc --      Pain Edu? --      Excl. in [place]? --     Constitutional: Alert and oriented. Well appearing and in no acute distress. Answers questions appropriately. Eyes: Conjunctivae are normal.  EOMI. No scleral icterus. Head: Atraumatic. Nose: No congestion/rhinnorhea. Mouth/Throat: Mucous membranes are moist.  Neck: No stridor.  Supple.   Cardiovascular: Normal rate, regular rhythm. No murmurs, rubs or gallops.  Respiratory: Normal respiratory effort.  No accessory muscle use or retractions. Lungs CTAB.  No wheezes, rales or ronchi. Gastrointestinal: Abdomen is soft and nondistended. The patient has diffuse nonfocal tenderness to palpation that is mild in the bilateral lower quadrants in the suprapubic area. He also has some mild tenderness to palpation over the pubic fat pad without any evidence of hernia.  No guarding or rebound.  No peritoneal signs. Musculoskeletal: No LE edema. No ttp in the calves or palpable cords.  Negative Homan's sign. Neurologic:  A&Ox3.  Speech is clear.  Face and smile are symmetric.  EOMI.  Moves all extremities well. Skin:  Skin is warm, dry and intact. No rash noted. Appears  pale. Psychiatric: Mood and affect are normal. Speech and behavior are normal.  Normal judgement.  ____________________________________________   LABS (all labs ordered are listed, but only abnormal results are displayed)  Labs Reviewed  COMPREHENSIVE METABOLIC PANEL - Abnormal; Notable for the following:    Sodium 128 (*)    Potassium 3.4 (*)    Chloride 92 (*)    Glucose, Bld 107 (*)    All  other components within normal limits  CBC - Abnormal; Notable for the following:    RBC 4.28 (*)    Hemoglobin 12.8 (*)    HCT 37.9 (*)    All other components within normal limits  URINALYSIS COMPLETEWITH MICROSCOPIC ([facility] ONLY) - Abnormal; Notable for the following:    Color, Urine YELLOW (*)    APPearance CLEAR (*)    All other components within normal limits  LIPASE, BLOOD   ____________________________________________  EKG  ED ECG REPORT I, Eula Listen, the attending physician, personally viewed and interpreted this ECG.   Date: 12/03/2015  EKG Time: 1038  Rate: 104  Rhythm: sinus tachycardia  Axis: Normal  Intervals:none  ST&T Change: No ST elevation.  ____________________________________________  RADIOLOGY  Ct Abdomen Pelvis W Contrast  12/03/2015  CLINICAL DATA:  Generalized Abd that started 2 weeks ago. Nausea not vomiting No diarrhea. C/o sweats and chills. Lower back pain , chronic back pain. Hx squamous cell skin cancer. EXAM: CT ABDOMEN AND PELVIS WITH CONTRAST TECHNIQUE: Multidetector CT imaging of the abdomen and pelvis was performed using the standard protocol following bolus administration of intravenous contrast. CONTRAST:  100 mL of Isovue-300 intravenous contrast COMPARISON:  None. FINDINGS: Lung bases: Linear and discoid lung base atelectasis. No evidence of pneumonia or edema. Heart normal in size. Mild to moderate coronary artery calcifications. Hepatobiliary: Small calcifications along the periphery of the left lobe. No liver mass. Liver normal in  size. Gallbladder is distended but otherwise unremarkable. No bile duct dilation. Spleen, pancreas, adrenal glands:  Unremarkable. Kidneys, ureters, bladder: 2.5 cm left kidney upper pole cyst. Mild bilateral renal cortical thinning. No stones. No hydronephrosis. Ureters normal course and in caliber. Bladder is unremarkable. Lymph nodes:  No adenopathy. Ascites:  None. Gastrointestinal: Stomach and small bowel are unremarkable. There are scattered colonic diverticula. No evidence of diverticulitis. Colon otherwise unremarkable. The appendix is retrocecal. It measures 6 mm in diameter. There is no significant periappendiceal inflammatory change. Musculoskeletal: Degenerative changes throughout the visualized spine. Bones are demineralized. No osteoblastic or osteolytic lesions. Abdominal wall:  Fat containing small left inguinal hernia. IMPRESSION: 1. No acute findings. 2. Chronic findings include a left renal cyst, colonic diverticula and degenerative changes throughout the visualized spine. Electronically Signed   By: Lajean Manes M.D.   On: 12/03/2015 12:36    ____________________________________________   PROCEDURES  Procedure(s) performed: None  Critical Care performed: No ____________________________________________   INITIAL IMPRESSION / ASSESSMENT AND PLAN / ED COURSE  Pertinent labs & imaging results that were available during my care of the patient were reviewed by me and considered in my medical decision making (see chart for details).  70 y.o. male with 2 weeks of lower abdominal pain presenting with worsening pain today. The patient has not had any imaging so I will do a CT scan for further evaluation. I would consider hernia, partial small bowel obstruction. Plan to initiate some dramatic treatment and reevaluation.  ----------------------------------------- 12:58 PM on 12/03/2015 -----------------------------------------  The patient's CT scan does not show any acute  intra-abdominal process. I have reevaluated the patient and he states that he still having pain but that is improved since he arrived to the emergency department. I am awaiting the results of his urinalysis. In the meantime, I'll give him more medication for pain and a by mouth challenge to see if he is able to keep down liquids. My plan is to discharge the patient with close PMD follow-up. He will also follow up with the  GI specialist as previously planned. He understands return precautions as well as follow-up instructions. ____________________________________________  FINAL CLINICAL IMPRESSION(S) / ED DIAGNOSES  Final diagnoses:  Lower abdominal pain  Nausea without vomiting  Constipation, unspecified constipation type  Hyponatremia      NEW MEDICATIONS STARTED DURING THIS VISIT:  New Prescriptions   ONDANSETRON (ZOFRAN ODT) 4 MG DISINTEGRATING TABLET    Take 1 tablet (4 mg total) by mouth every 8 (eight) hours as needed for nausea or vomiting.     Eula Listen, MD 12/03/15 [phone]

## 2015-12-03 NOTE — ED Notes (Addendum)
Generalized Abd that started 2 weeks ago. Nausea not vomiting No diarrhea. C/o sweats and chills.  Lower back pain , chronic back pain. Plans surgery.

## 2015-12-03 NOTE — Discharge Instructions (Signed)
Please continue to take your medications as prescribed for pain. Please make an appointment with her primary care doctor for further evaluation and talk about pain management plan for breakthrough pain.  Please also have your doctor recheck your sodium level.  Return to the emergency department if he develops severe pain, shortness of breath, fever, inability to keep down fluids, or any other symptoms concerning to you.

## 2015-12-03 NOTE — ED Notes (Signed)
Per patient family patient is currently on Oxycodone for pain. Patient has been having severe abd pain and trouble with BMs. Patient last took Oxycodone this morning around 3:30am.   Patient has been having generalized abd pain for over a weeks, has been seen by his PCP 2 time and is waiting for a referral to a GI doctor. Patient has not had any imaging done. Patient denies fever but states that he does get chills sometimes. Patient states that he had a BM yesterday. Patient states that he has also had decreased appetite.

## 2016-01-14 ENCOUNTER — Other Ambulatory Visit: Payer: Self-pay

## 2016-01-14 ENCOUNTER — Emergency Department: Payer: Medicare HMO

## 2016-01-14 ENCOUNTER — Observation Stay
Admission: EM | Admit: 2016-01-14 | Discharge: 2016-01-16 | Disposition: A | Discharge status: Home / Self Care | Payer: Medicare HMO | Attending: Specialist | Admitting: Specialist

## 2016-01-14 DIAGNOSIS — Z885 Allergy status to narcotic agent status: Secondary | ICD-10-CM | POA: Insufficient documentation

## 2016-01-14 DIAGNOSIS — E876 Hypokalemia: Secondary | ICD-10-CM | POA: Insufficient documentation

## 2016-01-14 DIAGNOSIS — E860 Dehydration: Secondary | ICD-10-CM | POA: Diagnosis not present

## 2016-01-14 DIAGNOSIS — E871 Hypo-osmolality and hyponatremia: Secondary | ICD-10-CM | POA: Diagnosis present

## 2016-01-14 DIAGNOSIS — G4489 Other headache syndrome: Secondary | ICD-10-CM | POA: Diagnosis present

## 2016-01-14 DIAGNOSIS — Z85828 Personal history of other malignant neoplasm of skin: Secondary | ICD-10-CM | POA: Insufficient documentation

## 2016-01-14 DIAGNOSIS — Z79899 Other long term (current) drug therapy: Secondary | ICD-10-CM | POA: Insufficient documentation

## 2016-01-14 DIAGNOSIS — R531 Weakness: Secondary | ICD-10-CM | POA: Diagnosis present

## 2016-01-14 DIAGNOSIS — M1990 Unspecified osteoarthritis, unspecified site: Secondary | ICD-10-CM | POA: Diagnosis not present

## 2016-01-14 DIAGNOSIS — L89519 Pressure ulcer of right ankle, unspecified stage: Secondary | ICD-10-CM | POA: Diagnosis not present

## 2016-01-14 DIAGNOSIS — I10 Essential (primary) hypertension: Secondary | ICD-10-CM | POA: Diagnosis present

## 2016-01-14 DIAGNOSIS — I700 Atherosclerosis of aorta: Secondary | ICD-10-CM | POA: Diagnosis not present

## 2016-01-14 DIAGNOSIS — K559 Vascular disorder of intestine, unspecified: Secondary | ICD-10-CM

## 2016-01-14 DIAGNOSIS — C4490 Unspecified malignant neoplasm of skin, unspecified: Secondary | ICD-10-CM | POA: Insufficient documentation

## 2016-01-14 DIAGNOSIS — K4090 Unilateral inguinal hernia, without obstruction or gangrene, not specified as recurrent: Secondary | ICD-10-CM | POA: Diagnosis not present

## 2016-01-14 DIAGNOSIS — R001 Bradycardia, unspecified: Secondary | ICD-10-CM | POA: Diagnosis not present

## 2016-01-14 DIAGNOSIS — I491 Atrial premature depolarization: Secondary | ICD-10-CM | POA: Insufficient documentation

## 2016-01-14 DIAGNOSIS — K5900 Constipation, unspecified: Secondary | ICD-10-CM | POA: Diagnosis not present

## 2016-01-14 DIAGNOSIS — R9431 Abnormal electrocardiogram [ECG] [EKG]: Secondary | ICD-10-CM | POA: Diagnosis not present

## 2016-01-14 DIAGNOSIS — R42 Dizziness and giddiness: Secondary | ICD-10-CM | POA: Insufficient documentation

## 2016-01-14 DIAGNOSIS — R338 Other retention of urine: Secondary | ICD-10-CM | POA: Insufficient documentation

## 2016-01-14 DIAGNOSIS — R197 Diarrhea, unspecified: Secondary | ICD-10-CM | POA: Diagnosis not present

## 2016-01-14 DIAGNOSIS — M109 Gout, unspecified: Secondary | ICD-10-CM | POA: Insufficient documentation

## 2016-01-14 DIAGNOSIS — R109 Unspecified abdominal pain: Secondary | ICD-10-CM | POA: Insufficient documentation

## 2016-01-14 DIAGNOSIS — N281 Cyst of kidney, acquired: Secondary | ICD-10-CM | POA: Insufficient documentation

## 2016-01-14 DIAGNOSIS — Z8249 Family history of ischemic heart disease and other diseases of the circulatory system: Secondary | ICD-10-CM | POA: Diagnosis not present

## 2016-01-14 DIAGNOSIS — R51 Headache: Secondary | ICD-10-CM

## 2016-01-14 DIAGNOSIS — Z6827 Body mass index (BMI) 27.0-27.9, adult: Secondary | ICD-10-CM | POA: Diagnosis not present

## 2016-01-14 DIAGNOSIS — G9520 Unspecified cord compression: Secondary | ICD-10-CM | POA: Insufficient documentation

## 2016-01-14 DIAGNOSIS — K828 Other specified diseases of gallbladder: Secondary | ICD-10-CM | POA: Insufficient documentation

## 2016-01-14 DIAGNOSIS — R519 Headache, unspecified: Secondary | ICD-10-CM | POA: Diagnosis present

## 2016-01-14 DIAGNOSIS — E440 Moderate protein-calorie malnutrition: Secondary | ICD-10-CM | POA: Diagnosis not present

## 2016-01-14 DIAGNOSIS — N401 Enlarged prostate with lower urinary tract symptoms: Secondary | ICD-10-CM | POA: Insufficient documentation

## 2016-01-14 LAB — URINALYSIS COMPLETE WITH MICROSCOPIC (ARMC ONLY)
Bilirubin Urine: NEGATIVE
Glucose, UA: NEGATIVE mg/dL
Hgb urine dipstick: NEGATIVE
Ketones, ur: NEGATIVE mg/dL
Leukocytes, UA: NEGATIVE
Nitrite: NEGATIVE
PH: 7 (ref 5.0–8.0)
PROTEIN: NEGATIVE mg/dL
Specific Gravity, Urine: 1.016 (ref 1.005–1.030)

## 2016-01-14 LAB — COMPREHENSIVE METABOLIC PANEL
ALBUMIN: 3.9 g/dL (ref 3.5–5.0)
ALT: 25 U/L (ref 17–63)
AST: 21 U/L (ref 15–41)
Alkaline Phosphatase: 64 U/L (ref 38–126)
Anion gap: 7 (ref 5–15)
BUN: 14 mg/dL (ref 6–20)
CHLORIDE: 85 mmol/L — AB (ref 101–111)
CO2: 32 mmol/L (ref 22–32)
Calcium: 9.8 mg/dL (ref 8.9–10.3)
Creatinine, Ser: 0.61 mg/dL (ref 0.61–1.24)
GFR calc Af Amer: >60 mL/min (ref >60)
GLUCOSE: 94 mg/dL (ref 65–99)
POTASSIUM: 3.4 mmol/L — AB (ref 3.5–5.1)
SODIUM: 124 mmol/L — AB (ref 135–145)
Total Bilirubin: 1 mg/dL (ref 0.3–1.2)
Total Protein: 7 g/dL (ref 6.5–8.1)

## 2016-01-14 LAB — CBC WITH DIFFERENTIAL/PLATELET
BASOS ABS: 0 10*3/uL (ref 0–0.1)
BASOS PCT: 1 %
Eosinophils Absolute: 0.2 10*3/uL (ref 0–0.7)
Eosinophils Relative: 3 %
HEMATOCRIT: 34.2 % — AB (ref 40.0–52.0)
HEMOGLOBIN: 11.8 g/dL — AB (ref 13.0–18.0)
Lymphocytes Relative: 22 %
Lymphs Abs: 1.7 10*3/uL (ref 1.0–3.6)
MCH: 30.3 pg (ref 26.0–34.0)
MCHC: 34.5 g/dL (ref 32.0–36.0)
MCV: 87.9 fL (ref 80.0–100.0)
MONOS PCT: 9 %
Monocytes Absolute: 0.7 10*3/uL (ref 0.2–1.0)
NEUTROS ABS: 5 10*3/uL (ref 1.4–6.5)
NEUTROS PCT: 65 %
Platelets: 223 10*3/uL (ref 150–440)
RBC: 3.9 MIL/uL — ABNORMAL LOW (ref 4.40–5.90)
RDW: 14 % (ref 11.5–14.5)
WBC: 7.6 10*3/uL (ref 3.8–10.6)

## 2016-01-14 LAB — TROPONIN I

## 2016-01-14 LAB — LACTIC ACID, PLASMA: Lactic Acid, Venous: 0.6 mmol/L (ref 0.5–2.0)

## 2016-01-14 MED ORDER — AMLODIPINE BESYLATE 5 MG PO TABS
5.0000 mg | ORAL_TABLET | Freq: Every day | ORAL | Status: DC
  Administered 2016-01-15 – 2016-01-16 (×2): 5 mg via ORAL
  Filled 2016-01-14 (×2): qty 1

## 2016-01-14 MED ORDER — SODIUM CHLORIDE 0.9% FLUSH
3.0000 mL | Freq: Two times a day (BID) | INTRAVENOUS | Status: DC
  Administered 2016-01-14 – 2016-01-16 (×4): 3 mL via INTRAVENOUS

## 2016-01-14 MED ORDER — OXYCODONE-ACETAMINOPHEN 5-325 MG PO TABS
1.0000 | ORAL_TABLET | Freq: Three times a day (TID) | ORAL | Status: DC | PRN
Start: 2016-01-14 — End: 2016-01-15
  Administered 2016-01-14 – 2016-01-15 (×2): 1 via ORAL
  Filled 2016-01-14 (×2): qty 1

## 2016-01-14 MED ORDER — ONDANSETRON HCL 4 MG/2ML IJ SOLN: 4.0000 mg | Freq: Four times a day (QID) | INTRAMUSCULAR | Status: DC | PRN

## 2016-01-14 MED ORDER — PROCHLORPERAZINE EDISYLATE 5 MG/ML IJ SOLN
INTRAMUSCULAR | Status: AC
  Administered 2016-01-14: 10 mg via INTRAVENOUS
  Filled 2016-01-14: qty 2

## 2016-01-14 MED ORDER — ONDANSETRON HCL 4 MG PO TABS: 4.0000 mg | ORAL_TABLET | Freq: Four times a day (QID) | ORAL | Status: DC | PRN

## 2016-01-14 MED ORDER — PROCHLORPERAZINE EDISYLATE 5 MG/ML IJ SOLN
10.0000 mg | Freq: Once | INTRAMUSCULAR | Status: AC
  Administered 2016-01-14: 10 mg via INTRAVENOUS

## 2016-01-14 MED ORDER — DIPHENHYDRAMINE HCL 50 MG/ML IJ SOLN
INTRAMUSCULAR | Status: AC
  Administered 2016-01-14: 12.5 mg via INTRAVENOUS
  Filled 2016-01-14: qty 1

## 2016-01-14 MED ORDER — LISINOPRIL 20 MG PO TABS
20.0000 mg | ORAL_TABLET | Freq: Every day | ORAL | Status: DC
  Administered 2016-01-15 – 2016-01-16 (×2): 20 mg via ORAL
  Filled 2016-01-14 (×2): qty 1

## 2016-01-14 MED ORDER — ACETAMINOPHEN 325 MG PO TABS
650.0000 mg | ORAL_TABLET | Freq: Four times a day (QID) | ORAL | Status: DC | PRN
  Administered 2016-01-15 – 2016-01-16 (×2): 650 mg via ORAL
  Filled 2016-01-14 (×2): qty 2

## 2016-01-14 MED ORDER — DIPHENHYDRAMINE HCL 50 MG/ML IJ SOLN
12.5000 mg | Freq: Once | INTRAMUSCULAR | Status: AC
Start: 2016-01-14 — End: 2016-01-14
  Administered 2016-01-14: 12.5 mg via INTRAVENOUS

## 2016-01-14 MED ORDER — SODIUM CHLORIDE 0.9 % IV SOLN
Freq: Once | INTRAVENOUS | Status: AC
Start: 2016-01-14 — End: 2016-01-14
  Administered 2016-01-14: 20:00:00 via INTRAVENOUS

## 2016-01-14 MED ORDER — SODIUM CHLORIDE 0.9 % IV SOLN
INTRAVENOUS | Status: AC
  Administered 2016-01-14: 23:00:00 via INTRAVENOUS

## 2016-01-14 MED ORDER — ENOXAPARIN SODIUM 40 MG/0.4ML ~~LOC~~ SOLN
40.0000 mg | SUBCUTANEOUS | Status: DC
  Administered 2016-01-15: 40 mg via SUBCUTANEOUS
  Filled 2016-01-14: qty 0.4

## 2016-01-14 NOTE — ED Notes (Signed)
Report called to Chiquita Loth, RN on 2C.

## 2016-01-14 NOTE — ED Provider Notes (Signed)
[facility] Emergency Department Provider Note   ____________________________________________  Time seen: Approximately 8:01 PM  I have reviewed the triage vital signs and the nursing notes.   HISTORY  Chief Complaint Headache and Weakness    HPI Benjamin SCHMEICHEL Sr. is a 70 y.o. male patient was to get an upper and lower endoscopy. He began to prep by taking his Dulcolax. He then gradually developed a headache which started as nothing so mild he doesn't remember the onset and then now has become gradually severe. Patient has lower abdominal pain which is been present unchanged for 3 months she's had a CT scan the endoscopy was to help evaluate this. Besides the headache patient be has become weak and a little woozy unable to walk straight. He denies running a fever he is nauseated but has had no vomiting or diarrhea. The headache is bitemporal runs around final head as well. Just a dull achy headache. Patient has had headaches like this in the past been taking oxycodone for that but that oxycodone appears to stop working.   Past Medical History  Diagnosis Date  . Hypertension   . Arthritis   . Gout   . Cancer ([place])   . Cervical pain (neck)   . Squamous cell skin cancer     right ankle  . PONV (postoperative nausea and vomiting)     Patient Active Problem List   Diagnosis Date Noted  . Spinal cord compression ([place]) 09/14/2015  . Paresthesia of arm 09/14/2015  . Hyponatremia 09/14/2015  . Essential hypertension 09/14/2015  . Skin cancer 09/14/2015  . Debility 09/14/2015  . Right arm weakness 09/14/2015  . Nonspecific abnormal electrocardiogram (ECG) (EKG) 09/14/2015  . Bradycardia 09/14/2015    Past Surgical History  Procedure Laterality Date  . Lesion removal Right 10/26/2015    Procedure: EXCISION RIGHT ANKLE SKIN ;  Surgeon: Wallace Going, DO;  Location: [place];  Service: Plastics;  Laterality: Right;  . Skin graft  Right 11/08/2015    Procedure: IRRIGATION AND DEBRIDEMENT AND SPLIT THICKNESS SKIN GRAFT RIGHT ANKLE ;  Surgeon: Wallace Going, DO;  Location: [place];  Service: Plastics;  Laterality: Right;    Current Outpatient Rx  Name  Route  Sig  Dispense  Refill  . acetaminophen (TYLENOL) 325 MG tablet   Oral   Take 2 tablets (650 mg total) by mouth every 6 (six) hours as needed for mild pain (or Fever >/= 101).         Marland Kitchen amLODipine (NORVASC) 5 MG tablet   Oral   Take 5 mg by mouth daily.         . diclofenac (VOLTAREN) 75 MG EC tablet   Oral   Take 75 mg by mouth 2 (two) times daily.         . feeding supplement, ENSURE ENLIVE, (ENSURE ENLIVE) LIQD   Oral   Take 237 mLs by mouth 2 (two) times daily between meals.   237 mL   12   . fluticasone (VERAMYST) 27.5 MCG/SPRAY nasal spray   Nasal   Place 2 sprays into the nose daily.         Marland Kitchen lisinopril-hydrochlorothiazide (PRINZIDE,ZESTORETIC) 20-12.5 MG tablet   Oral   Take 2 tablets by mouth daily.         . ondansetron (ZOFRAN ODT) 4 MG disintegrating tablet   Oral   Take 1 tablet (4 mg total) by mouth every 8 (eight)  hours as needed for nausea or vomiting.   10 tablet   0   . ondansetron (ZOFRAN) 4 MG tablet   Oral   Take 1 tablet by mouth every 6 (six) hours as needed.         Marland Kitchen oxyCODONE-acetaminophen (PERCOCET/ROXICET) 5-325 MG tablet   Oral   Take 1 tablet by mouth every 8 (eight) hours as needed for severe pain.           Allergies Codeine  Family History  Problem Relation Age of Onset  . CAD Father     Social History Social History  Substance Use Topics  . Smoking status: Never Smoker   . Smokeless tobacco: Not on file  . Alcohol Use: Yes     Comment: rare    Review of Systems Constitutional: No fever/chills Eyes: No visual changes. ENT: No sore throat. Cardiovascular: Denies chest pain. Respiratory: Denies shortness of breath. Gastrointestinal:See history of  present illness Genitourinary: Negative for dysuria. Musculoskeletal: Negative for back pain. Skin: Negative for rash. Neurological: Negative for headaches, focal weakness or numbness.  10-point ROS otherwise negative.  ____________________________________________   PHYSICAL EXAM:  VITAL SIGNS: ED Triage Vitals  Enc Vitals Group     BP 01/14/16 1936 153/88 mmHg     Pulse Rate 01/14/16 1936 65     Resp 01/14/16 1936 18     Temp 01/14/16 1936 97.6 F (36.4 C)     Temp Source 01/14/16 1936 Oral     SpO2 01/14/16 1936 100 %     Weight 01/14/16 1936 178 lb 5 oz (80.882 kg)     Height 01/14/16 1936 5\' 8"  (1.727 m)     Head Cir --      Peak Flow --      Pain Score 01/14/16 1937 10     Pain Loc --      Pain Edu? --      Excl. in [place]? --     Constitutional: Alert and oriented. Well appearing and in no acute distress. Eyes: Conjunctivae are normal. PERRL. EOMI.Fundi are somewhat difficult to see his patient keeps moving his eyes but I do not see any pathology Head: Atraumatic. Nose: No congestion/rhinnorhea. Mouth/Throat: Mucous membranes are moist.  Oropharynx non-erythematous. Neck: No stridor.   Cardiovascular: Normal rate, regular rhythm. Grossly normal heart sounds.  Good peripheral circulation. Respiratory: Normal respiratory effort.  No retractions. Lungs CTAB. Gastrointestinal: Soft Tender to palpation in the lower abdomen No distention. No abdominal bruits. No CVA tenderness. Musculoskeletal: No lower extremity tenderness nor edema.  No joint effusions. Neurologic:  Normal speech and language. No gross focal neurologic deficits are appreciated. Cranial nerves II through XII appear to be intact. Finger-nose rapid alternating movements and hands are normal motor strength is 5 over 5 throughout sensation appears intact throughout No gait instability. Skin:  Skin is warm, dry and intact. No rash noted. Patient has a dressing on the right ankle for his skin cancers being treated.  There is no pain in that area patient reports. There is no redness that I can see by moving a dressing. Psychiatric: Mood and affect are normal. Speech and behavior are normal.  ____________________________________________   LABS (all labs ordered are listed, but only abnormal results are displayed)  Labs Reviewed  URINALYSIS COMPLETEWITH MICROSCOPIC ([facility] ONLY) - Abnormal; Notable for the following:    Color, Urine YELLOW (*)    APPearance CLOUDY (*)    Bacteria, UA RARE (*)    Squamous Epithelial /  LPF 0-5 (*)    All other components within normal limits  COMPREHENSIVE METABOLIC PANEL - Abnormal; Notable for the following:    Sodium 124 (*)    Potassium 3.4 (*)    Chloride 85 (*)    All other components within normal limits  CBC WITH DIFFERENTIAL/PLATELET - Abnormal; Notable for the following:    RBC 3.90 (*)    Hemoglobin 11.8 (*)    HCT 34.2 (*)    All other components within normal limits  TROPONIN I  LACTIC ACID, PLASMA  LACTIC ACID, PLASMA   ____________________________________________  EKG  EKG read and interpreted by me shows normal sinus rhythm at a rate of 64 left axis no acute ST-T wave changes ____________________________________________  RADIOLOGY  CT report pending I do not see any acute disease myself hospitalist is aware  ____________________________________________   PROCEDURES    ____________________________________________   INITIAL IMPRESSION / ASSESSMENT AND PLAN / ED COURSE  Pertinent labs & imaging results that were available during my care of the patient were reviewed by me and considered in my medical decision making (see chart for details).   ____________________________________________   FINAL CLINICAL IMPRESSION(S) / ED DIAGNOSES  Final diagnoses:  Weakness  Hyponatremia  Other headache syndrome      NEW MEDICATIONS STARTED DURING THIS VISIT:  New Prescriptions   No medications on file     Note:  This document  was prepared using Dragon voice recognition software and may include unintentional dictation errors.    Nena Polio, MD 01/14/16 2059

## 2016-01-14 NOTE — ED Notes (Signed)
Bladder scan performed and pt noted to have 854 ml of urine post void attempt with no voided output. Will place foley cath per MD order.

## 2016-01-14 NOTE — ED Notes (Signed)
Patient transported to CT 

## 2016-01-14 NOTE — ED Notes (Signed)
EMS pt from home with co headache and weakness, Pt scheduled for colonscopy tomorrow with Dr Vira Agar but did not finish his prep.

## 2016-01-14 NOTE — H&P (Signed)
[facility] NAME: Benjamin Turner    MR#:  [id number]  DATE OF BIRTH:  [birth year]  DATE OF ADMISSION:  01/14/2016  PRIMARY CARE PHYSICIAN: Marguerita Merles, MD   REQUESTING/REFERRING PHYSICIAN: Cinda Quest, MD  CHIEF COMPLAINT:   Chief Complaint  Patient presents with  . Headache  . Weakness    HISTORY OF PRESENT ILLNESS:  Benjamin Turner  is a 70 y.o. male who presents with Malaise and weakness, headache, and some dysuria with low abdominal pain. Patient states that his abdominal pain and dysuria started earlier today, and sometime later he became feeling somewhat weak, and developed a headache began to feel "woozy." He came to the ED for evaluation. He has had some abdominal pain for a long time and was scheduled for colonoscopy tomorrow. He was getting ready to initiate bowel prep tonight, but only and taking some Dulcolax prior to coming the ED for evaluation. Otherwise he has not had any significant amount of nausea or vomiting. Here in the ED he was found to be hyponatremic with a sodium of 124. However, when chart review it appears that his sodium usually runs between 128 and 131, so his current value is only mildly low for him. UA done in the ED was unimpressive for urinary tract infection. Headache is described as bitemporal and bandlike sensation around his head, without any other focal neurological signs or symptoms such as focal weakness, sensory deficit, visual change, slurred speech, etc. Hospitals were called for admission further evaluation  PAST MEDICAL HISTORY:   Past Medical History  Diagnosis Date  . Hypertension   . Arthritis   . Gout   . Cancer ([facility])   . Cervical pain (neck)   . Squamous cell skin cancer     right ankle  . PONV (postoperative nausea and vomiting)     PAST SURGICAL HISTORY:   Past Surgical History  Procedure Laterality Date  . Lesion removal Right 10/26/2015    Procedure: EXCISION RIGHT  ANKLE SKIN ;  Surgeon: Wallace Going, DO;  Location: [facility];  Service: Plastics;  Laterality: Right;  . Skin graft Right 11/08/2015    Procedure: IRRIGATION AND DEBRIDEMENT AND SPLIT THICKNESS SKIN GRAFT RIGHT ANKLE ;  Surgeon: Wallace Going, DO;  Location: [facility];  Service: Plastics;  Laterality: Right;    SOCIAL HISTORY:   Social History  Substance Use Topics  . Smoking status: Never Smoker   . Smokeless tobacco: Not on file  . Alcohol Use: Yes     Comment: rare    FAMILY HISTORY:   Family History  Problem Relation Age of Onset  . CAD Father     DRUG ALLERGIES:   Allergies  Allergen Reactions  . Codeine Nausea And Vomiting    MEDICATIONS AT HOME:   Prior to Admission medications   Medication Sig Start Date End Date Taking? Authorizing Provider  acetaminophen (TYLENOL) 325 MG tablet Take 2 tablets (650 mg total) by mouth every 6 (six) hours as needed for mild pain (or Fever >/= 101). 09/16/15   [name], MD  amLODipine (NORVASC) 5 MG tablet Take 5 mg by mouth daily.    Historical Provider, MD  diclofenac (VOLTAREN) 75 MG EC tablet Take 75 mg by mouth 2 (two) times daily.    Historical Provider, MD  feeding supplement, ENSURE ENLIVE, (ENSURE ENLIVE) LIQD Take 237 mLs by mouth 2 (two) times daily between meals. 09/16/15  [name], MD  fluticasone (VERAMYST) 27.5 MCG/SPRAY nasal spray Place 2 sprays into the nose daily.    Historical Provider, MD  lisinopril-hydrochlorothiazide (PRINZIDE,ZESTORETIC) 20-12.5 MG tablet Take 2 tablets by mouth daily.    Historical Provider, MD  ondansetron (ZOFRAN ODT) 4 MG disintegrating tablet Take 1 tablet (4 mg total) by mouth every 8 (eight) hours as needed for nausea or vomiting. 12/03/15   Anne-Caroline Mariea Clonts, MD  ondansetron (ZOFRAN) 4 MG tablet Take 1 tablet by mouth every 6 (six) hours as needed. 11/28/15   Historical Provider, MD  oxyCODONE-acetaminophen (PERCOCET/ROXICET)  5-325 MG tablet Take 1 tablet by mouth every 8 (eight) hours as needed for severe pain.    Historical Provider, MD    REVIEW OF SYSTEMS:  Review of Systems  Constitutional: Positive for malaise/fatigue. Negative for fever, chills and weight loss.  HENT: Negative for ear pain, hearing loss and tinnitus.   Eyes: Negative for blurred vision, double vision, pain and redness.  Respiratory: Negative for cough, hemoptysis and shortness of breath.   Cardiovascular: Negative for chest pain, palpitations, orthopnea and leg swelling.  Gastrointestinal: Positive for nausea and abdominal pain. Negative for vomiting, diarrhea and constipation.  Genitourinary: Positive for dysuria. Negative for frequency and hematuria.  Musculoskeletal: Negative for back pain, joint pain and neck pain.  Skin:       No acne, rash, or lesions  Neurological: Positive for weakness and headaches. Negative for dizziness, tremors and focal weakness.  Endo/Heme/Allergies: Negative for polydipsia. Does not bruise/bleed easily.  Psychiatric/Behavioral: Negative for depression. The patient is not nervous/anxious and does not have insomnia.      VITAL SIGNS:   Filed Vitals:   01/14/16 1936  BP: 153/88  Pulse: 65  Temp: 97.6 F (36.4 C)  TempSrc: Oral  Resp: 18  Height: 5\' 8"  (1.727 m)  Weight: 80.882 kg (178 lb 5 oz)  SpO2: 100%   Wt Readings from Last 3 Encounters:  01/14/16 80.882 kg (178 lb 5 oz)  12/03/15 81.647 kg (180 lb)  11/08/15 82.101 kg (181 lb)    PHYSICAL EXAMINATION:  Physical Exam  Vitals reviewed. Constitutional: He is oriented to person, place, and time. He appears well-developed and well-nourished. No distress.  HENT:  Head: Normocephalic and atraumatic.  Mouth/Throat: Oropharynx is clear and moist.  Eyes: Conjunctivae and EOM are normal. Pupils are equal, round, and reactive to light. No scleral icterus.  Neck: Normal range of motion. Neck supple. No JVD present. No thyromegaly present.   Cardiovascular: Normal rate, regular rhythm and intact distal pulses.  Exam reveals no gallop and no friction rub.   No murmur heard. Respiratory: Effort normal and breath sounds normal. No respiratory distress. He has no wheezes. He has no rales.  GI: Soft. Bowel sounds are normal. He exhibits no distension. There is tenderness (low abdominal midline tenderness).  Musculoskeletal: Normal range of motion. He exhibits no edema.  No arthritis, no gout  Lymphadenopathy:    He has no cervical adenopathy.  Neurological: He is alert and oriented to person, place, and time. No cranial nerve deficit.  No dysarthria, no aphasia  Skin: Skin is warm and dry. No rash noted. No erythema.  Psychiatric: He has a normal mood and affect. His behavior is normal. Judgment and thought content normal.    LABORATORY PANEL:   CBC  Recent Labs Lab 01/14/16 1952  WBC 7.6  HGB 11.8*  HCT 34.2*  PLT 223   ------------------------------------------------------------------------------------------------------------------  Chemistries   Recent Labs Lab  01/14/16 1952  NA 124*  K 3.4*  CL 85*  CO2 32  GLUCOSE 94  BUN 14  CREATININE 0.61  CALCIUM 9.8  AST 21  ALT 25  ALKPHOS 64  BILITOT 1.0   ------------------------------------------------------------------------------------------------------------------  Cardiac Enzymes  Recent Labs Lab 01/14/16 1952  TROPONINI <0.03   ------------------------------------------------------------------------------------------------------------------  RADIOLOGY:  Ct Head Wo Contrast  01/14/2016  CLINICAL DATA:  Initial evaluation for acute severe headache. EXAM: CT HEAD WITHOUT CONTRAST TECHNIQUE: Contiguous axial images were obtained from the base of the skull through the vertex without intravenous contrast. COMPARISON:  Prior study from 09/13/2015. FINDINGS: Generalized cerebral atrophy with chronic small vessel ischemic disease present, similar to  prior. No acute intracranial hemorrhage. No acute large vessel territory infarct. No mass lesion, midline shift, or mass effect. No hydrocephalus. No extra-axial fluid collection. Scalp soft tissues within normal limits. No acute abnormality about the orbits. Paranasal sinuses are clear.  No mastoid effusion. Calvarium intact. IMPRESSION: 1. No acute intracranial process. 2. Generalized cerebral atrophy with mild chronic small vessel ischemic disease, stable. Electronically Signed   By: Jeannine Boga M.D.   On: 01/14/2016 21:03    EKG:   Orders placed or performed during the hospital encounter of 01/14/16  . ED EKG  . ED EKG  . ED EKG  . ED EKG    IMPRESSION AND PLAN:  Principal Problem:   Weakness - unclear etiology at this time. Despite negative UA it is still somewhat possible that he might have a UTI. However, we will not start antibiotics at this time, we'll simply send the urine culture. It is also possible that his symptoms are from his low sodium. He is normally on hydrochlorothiazide for blood pressure and this may have contributed to his troponins sodium, though has been on this medication for some time. We will hydrate him with IV fluids tonight, get a PT evaluation tomorrow and reassess his condition the morning. Active Problems:   Hyponatremia - as discussed above, potentially related to HCTZ use, or simply dehydration. Hydrate with IV fluids and monitor.   Essential hypertension - continue home meds except for HCTZ.   Headache - when necessary analgesic treatment, description matches tension headache.  All the records are reviewed and case discussed with ED provider. Management plans discussed with the patient and/or family.  DVT PROPHYLAXIS: SubQ lovenox  GI PROPHYLAXIS: None  ADMISSION STATUS: Observation  CODE STATUS: Full Code Status History    Date Active Date Inactive Code Status Order ID Comments User Context   09/14/2015 10:02 PM 09/16/2015  7:42 PM Full  Code [id number]  Toy Baker, MD Inpatient      TOTAL TIME TAKING CARE OF THIS PATIENT: 40 minutes.    Benjamin Turner 01/14/2016, 9:13 PM  [facility]  Office  [phone]  CC: Primary care physician; Marguerita Merles, MD

## 2016-01-15 ENCOUNTER — Ambulatory Visit
Admission: RE | Admit: 2016-01-15 | Payer: Medicare HMO | Source: Ambulatory Visit | Admitting: Unknown Physician Specialty

## 2016-01-15 ENCOUNTER — Encounter
Admission: EM | Disposition: A | Discharge status: Home / Self Care | Payer: Self-pay | Source: Home / Self Care | Attending: Emergency Medicine

## 2016-01-15 LAB — CBC
HEMATOCRIT: 36 % — AB (ref 40.0–52.0)
Hemoglobin: 12.3 g/dL — ABNORMAL LOW (ref 13.0–18.0)
MCH: 29.9 pg (ref 26.0–34.0)
MCHC: 34.2 g/dL (ref 32.0–36.0)
MCV: 87.4 fL (ref 80.0–100.0)
Platelets: 230 10*3/uL (ref 150–440)
RBC: 4.12 MIL/uL — ABNORMAL LOW (ref 4.40–5.90)
RDW: 14.2 % (ref 11.5–14.5)
WBC: 7.8 10*3/uL (ref 3.8–10.6)

## 2016-01-15 LAB — BASIC METABOLIC PANEL
ANION GAP: 5 (ref 5–15)
BUN: 10 mg/dL (ref 6–20)
CALCIUM: 9.5 mg/dL (ref 8.9–10.3)
CO2: 31 mmol/L (ref 22–32)
Chloride: 93 mmol/L — ABNORMAL LOW (ref 101–111)
Creatinine, Ser: 0.52 mg/dL — ABNORMAL LOW (ref 0.61–1.24)
GFR calc Af Amer: >60 mL/min (ref >60)
GLUCOSE: 90 mg/dL (ref 65–99)
POTASSIUM: 3.1 mmol/L — AB (ref 3.5–5.1)
SODIUM: 129 mmol/L — AB (ref 135–145)

## 2016-01-15 SURGERY — COLONOSCOPY WITH PROPOFOL
Anesthesia: General

## 2016-01-15 MED ORDER — OXYCODONE-ACETAMINOPHEN 5-325 MG PO TABS
1.0000 | ORAL_TABLET | ORAL | Status: DC | PRN
  Administered 2016-01-15 – 2016-01-16 (×2): 1 via ORAL
  Filled 2016-01-15: qty 1

## 2016-01-15 MED ORDER — GUAIFENESIN ER 600 MG PO TB12
600.0000 mg | ORAL_TABLET | Freq: Two times a day (BID) | ORAL | Status: DC
  Administered 2016-01-15 – 2016-01-16 (×3): 600 mg via ORAL
  Filled 2016-01-15 (×3): qty 1

## 2016-01-15 MED ORDER — CHLORHEXIDINE GLUCONATE 0.12 % MT SOLN
15.0000 mL | Freq: Two times a day (BID) | OROMUCOSAL | Status: DC
  Administered 2016-01-15 – 2016-01-16 (×3): 15 mL via OROMUCOSAL
  Filled 2016-01-15 (×3): qty 15

## 2016-01-15 MED ORDER — POTASSIUM CHLORIDE CRYS ER 20 MEQ PO TBCR
40.0000 meq | EXTENDED_RELEASE_TABLET | ORAL | Status: AC
  Administered 2016-01-15 (×2): 40 meq via ORAL
  Filled 2016-01-15 (×2): qty 2

## 2016-01-15 MED ORDER — CETYLPYRIDINIUM CHLORIDE 0.05 % MT LIQD
7.0000 mL | Freq: Two times a day (BID) | OROMUCOSAL | Status: DC
Start: 2016-01-15 — End: 2016-01-16
  Administered 2016-01-15 – 2016-01-16 (×3): 7 mL via OROMUCOSAL

## 2016-01-15 MED ORDER — OXYCODONE-ACETAMINOPHEN 5-325 MG PO TABS
1.0000 | ORAL_TABLET | ORAL | Status: DC | PRN
  Filled 2016-01-15: qty 1

## 2016-01-15 MED ORDER — TAMSULOSIN HCL 0.4 MG PO CAPS
0.4000 mg | ORAL_CAPSULE | Freq: Every day | ORAL | Status: DC
  Administered 2016-01-15 – 2016-01-16 (×2): 0.4 mg via ORAL
  Filled 2016-01-15 (×2): qty 1

## 2016-01-15 MED ORDER — DEXTROMETHORPHAN POLISTIREX ER 30 MG/5ML PO SUER
30.0000 mg | Freq: Two times a day (BID) | ORAL | Status: DC
  Administered 2016-01-15 – 2016-01-16 (×3): 30 mg via ORAL
  Filled 2016-01-15 (×5): qty 5

## 2016-01-15 MED ORDER — DM-GUAIFENESIN ER 30-600 MG PO TB12
1.0000 | ORAL_TABLET | Freq: Two times a day (BID) | ORAL | Status: DC
  Filled 2016-01-15 (×2): qty 1

## 2016-01-15 NOTE — Care Management Obs Status (Signed)
[facility] NOTIFICATION   Patient Details  Name: Benjamin LAQUE Sr. MRN: [id number] Date of Birth: [birth year]   Medicare Observation Status Notification Given:  Yes    Marshell Garfinkel, RN 01/15/2016, 8:45 AM

## 2016-01-15 NOTE — Evaluation (Signed)
Physical Therapy Evaluation Patient Details Name: Benjamin BERDUGO Sr. MRN: [id number] DOB: May 02, 1946 Today's Date: 01/15/2016   History of Present Illness  Benjamin Turner is a 70 y.o. male who presents with malaise and weakness, headache, and some dysuria with low abdominal pain. Patient states that his abdominal pain and dysuria started earlier today, and sometime later he became feeling somewhat weak, and developed a headache began to feel "woozy." He came to the ED for evaluation. He has had some abdominal pain for a long time and was scheduled for colonoscopy tomorrow. He was getting ready to initiate bowel prep tonight, but only and taking some Dulcolax prior to coming the ED for evaluation. Otherwise he has not had any significant amount of nausea or vomiting. Here in the ED he was found to be hyponatremic with a sodium of 124. UA done in the ED was unimpressive for urinary tract infection. Headache is described as bitemporal and band-like sensation around his head, without any other focal neurological signs or symptoms such as focal weakness, sensory deficit, visual change, slurred speech, etc. 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  Clinical Impression  Pt demonstrates good bed mobility and transfers. His gait is slow and labored with use of rolling walker. At baseline pt just holds onto walls and furniture at home. States he does not leave his house and his hallways are too narrow for a rolling walker. Pt with some balance deficits in addition to generalized weakness and deconditioning. His wife reports that his mobility is not too far deviated currently from baseline however he is deconditioned and doesn't normally utilize a walker for ambulation. Pt would benefit from skilled PT services to address deficits in strength,  balance, and safety in order to improve function at decrease fall risk at home.     Follow Up Recommendations Home health PT    Equipment Recommendations  None recommended by PT    Recommendations for Other Services       Precautions / Restrictions Precautions Precautions: Fall Restrictions Weight Bearing Restrictions: No      Mobility  Bed Mobility Overal bed mobility: Modified Independent             General bed mobility comments: Heavy use of bed rails but able to perform without assistance  Transfers Overall transfer level: Needs assistance Equipment used: Rolling walker (2 wheeled) Transfers: Sit to/from Stand Sit to Stand: Min guard         General transfer comment: Pt demonstrates increased time required to come to standing. Fair stability with UE support  Ambulation/Gait Ambulation/Gait assistance: Min guard Ambulation Distance (Feet): 100 Feet Assistive device: Rolling walker (2 wheeled) Gait Pattern/deviations: Decreased step length - right;Decreased step length - left   Gait velocity interpretation: Below normal speed for age/gender General Gait Details: Pt takes short shuffling steps with significant forward trunk leans. Cues for upright posture and safety with turns. Vitals remain WNL throughout ambulation and monitored intermittently. Fatigue monitored and pt reports he is unable to ambulate at this time. Hard to get pt to rate fatigue at this time.   Stairs            Wheelchair Mobility    Modified Rankin (Stroke Patients Only)       Balance Overall balance assessment: Needs assistance Sitting-balance support: No upper extremity  supported Sitting balance-Leahy Scale: Good     Standing balance support: No upper extremity supported Standing balance-Leahy Scale: Fair Standing balance comment: Positive Rhomberg for increased sway and then LOB after approximately 10 seconds. Poor single leg stance time without UE support observed  functionally with short distance ambulation in room                             Pertinent Vitals/Pain Pain Assessment: 0-10 Pain Location: Stomach, pt will not rate on NPRS but reports it "hurts a lot" Pain Intervention(s): Monitored during session;Premedicated before session    [place] expects to be discharged to:: Private residence Living Arrangements: Spouse/significant other Available Help at Discharge: Family Type of Home: Mobile home Home Access: Stairs to enter Entrance Stairs-Rails: Can reach both Entrance Stairs-Number of Steps: 4 Home Layout: One level Home Equipment: Cane - single point;Walker - 2 wheels (Does not own assistive devices but has access from family)      Prior Function Level of Independence: Needs assistance         Comments: Pt reports walking independently but relying heavily on furniture and walls. Does not want to use an assistive device. Bathes himself at sink. Requires assist for IADLs     Hand Dominance   Dominant Hand: Right    Extremity/Trunk Assessment   Upper Extremity Assessment: Generalized weakness;RUE deficits/detail RUE Deficits / Details: R shoulder flexion limited to approximately 90 degrees. 3+/5 shoulder strength. Otherwise LE strength is grossly 4- to 4/5 throughout.         Lower Extremity Assessment: Generalized weakness         Communication   Communication: No difficulties  Cognition Arousal/Alertness: Awake/alert Behavior During Therapy: WFL for tasks assessed/performed Overall Cognitive Status: Within Functional Limits for tasks assessed                      General Comments      Exercises        Assessment/Plan    PT Assessment Patient needs continued PT services  PT Diagnosis Difficulty walking;Abnormality of gait;Generalized weakness   PT Problem List Decreased strength;Decreased activity tolerance;Decreased balance;Decreased mobility;Decreased safety  awareness  PT Treatment Interventions DME instruction;Gait training;Stair training;Therapeutic activities;Therapeutic exercise;Balance training;Neuromuscular re-education;Patient/family education   PT Goals (Current goals can be found in the Care Plan section) Acute Rehab PT Goals Patient Stated Goal: "I want somebody to figure out why my stomach hurts." Pt wants to improve his function at home PT Goal Formulation: With patient/family Time For Goal Achievement: 01/29/16 Potential to Achieve Goals: Fair    Frequency Min 2X/week   Barriers to discharge Inaccessible home environment 4 stairs to enter home. Wife has to assist up stairs at baseline    Co-evaluation               End of Session Equipment Utilized During Treatment: Gait belt Activity Tolerance: Patient limited by fatigue Patient left: in bed;with bed alarm set;with call bell/phone within reach;Other (comment) (Refused up to recliner) Nurse Communication: Mobility status    Functional Assessment Tool Used: clinical judgement Functional Limitation: Mobility: Walking and moving around Mobility: Walking and Moving Around Current Status [phone]): At least 40 percent but less than 60 percent impaired, limited or restricted Mobility: Walking and Moving Around Goal Status [phone]): At least 20 percent but less than 40 percent impaired, limited or restricted    Time: [id number] PT Time Calculation (min) (ACUTE ONLY):  16 min   Charges:   PT Evaluation $PT Eval Moderate Complexity: 1 Procedure     PT G Codes:   PT G-Codes **NOT FOR INPATIENT CLASS** Functional Assessment Tool Used: clinical judgement Functional Limitation: Mobility: Walking and moving around Mobility: Walking and Moving Around Current Status [id number]): At least 40 percent but less than 60 percent impaired, limited or restricted Mobility: Walking and Moving Around Goal Status [phone]): At least 20 percent but less than 40 percent impaired, limited or restricted    Phillips Grout PT, DPT   Rayli Wiederhold 01/15/2016, 11:26 AM

## 2016-01-15 NOTE — Care Management (Signed)
Spoke again with patient/wife regarding home health agency and DME needs. They would like to use [facility]. Referral sent to [facility] with [facility]. Wife states "no DME needs" or does PT. RNCM will continue to follow.

## 2016-01-15 NOTE — Care Management (Signed)
Met with patient and his wife to deliver Medicare OBServation letter. Patient states he "wished they would just find out what was wrong with me". He states he has progressively become weaker but still able to ambulate to his bathroom at home. He is not using any assistance nor does he own any DME. His PCP is Delight Stare with [facility]. He denies difficulty obtaining meds. List of home health agencies left with patient. MD updated. RNCM will follow for DME and HH needs. Patient appeared to be tolerating his breakfast this AM.

## 2016-01-15 NOTE — Consult Note (Signed)
Went to see patient- he was being seen by Ms. Bridges, PA at at that time for   GI.

## 2016-01-15 NOTE — Consult Note (Signed)
GI Inpatient Consult Note  Reason for Consult: abd pain    Attending Requesting Consult: Dr. Verdell Carmine  History of Present Illness: Benjamin GAYE Sr. is a 70 y.o. male seen for evaluation of abdominal pain at the request of Dr. Verdell Carmine. PMHx of HTN, arthritis, gout, OA. Seen in [facility] GI clinic in 11/2015- at that time had constipation, started on Linzess, no improvement. Called that office and had script for Linzess, Zofran, and omeprazole added to regimen last week. Was scheduled for colonoscopy/EGD today.  Somewhat difficult historian. He reports his nausea and abdominal pain began in January. He describes lower abdominal pain, begins immediately after eating,. Having a BM about every other day. Pain does improve w/ defecation. Reports alternating diarrhea and constipation, but constipation seems more predominant. Is taking Linzess after meals instead of before meals. Also taking a 1/2 tablet Linzess some days. Reports a 20lb weight loss over past 6 months. Family concerned he is not eating as much as used to, will eat 1/2 sand which and be full for hours. Intermittent rectal bleeding he relates to hemorrhoids.   He presented to ER for feeling "woozy" after taking dulcolax, this caused diarrhea/abdominal cramping. On admission had hyponatremia and hypokalemia. Reports nausea drastically improves w/ zofran, zofran also helps his lower abd pain. Denies GERD symptoms. Does have dysphagia-solids, large pills, chronic. Denies taking ibuprofen on his med list-has switched to tylenol. Tolerated chicken salad for lunch.    Last Colonoscopy: None prior Last Endoscopy: None prior   Past Medical History:  Past Medical History  Diagnosis Date  . Hypertension   . Arthritis   . Gout   . Cancer ([name])   . Cervical pain (neck)   . Squamous cell skin cancer     right ankle  . PONV (postoperative nausea and vomiting)     Problem List: Patient Active Problem List   Diagnosis Date Noted  . Weakness  01/14/2016  . Headache 01/14/2016  . Spinal cord compression ([place]) 09/14/2015  . Paresthesia of arm 09/14/2015  . Hyponatremia 09/14/2015  . Essential hypertension 09/14/2015  . Skin cancer 09/14/2015  . Debility 09/14/2015  . Right arm weakness 09/14/2015  . Nonspecific abnormal electrocardiogram (ECG) (EKG) 09/14/2015  . Bradycardia 09/14/2015    Past Surgical History: Past Surgical History  Procedure Laterality Date  . Lesion removal Right 10/26/2015    Procedure: EXCISION RIGHT ANKLE SKIN ;  Surgeon: Wallace Going, DO;  Location: [facility];  Service: Plastics;  Laterality: Right;  . Skin graft Right 11/08/2015    Procedure: IRRIGATION AND DEBRIDEMENT AND SPLIT THICKNESS SKIN GRAFT RIGHT ANKLE ;  Surgeon: Wallace Going, DO;  Location: [facility];  Service: Plastics;  Laterality: Right;    Allergies: Allergies  Allergen Reactions  . Codeine Nausea And Vomiting    Home Medications: Prescriptions prior to admission  Medication Sig Dispense Refill Last Dose  . acetaminophen (TYLENOL) 325 MG tablet Take 2 tablets (650 mg total) by mouth every 6 (six) hours as needed for mild pain (or Fever >/= 101).   prn at prn  . diclofenac (VOLTAREN) 75 MG EC tablet Take 75 mg by mouth 2 (two) times daily.   12/03/2015 at Unknown time  . feeding supplement, ENSURE ENLIVE, (ENSURE ENLIVE) LIQD Take 237 mLs by mouth 2 (two) times daily between meals. 237 mL 12 12/03/2015 at Unknown time  . fluticasone (FLONASE) 50 MCG/ACT nasal spray Place 2 sprays into both nostrils daily.     Marland Kitchen  ibuprofen (ADVIL,MOTRIN) 200 MG tablet Take 200 mg by mouth every 6 (six) hours as needed for headache or moderate pain.     Marland Kitchen LINZESS 290 MCG CAPS capsule Take 290 mcg by mouth daily.     Marland Kitchen lisinopril-hydrochlorothiazide (PRINZIDE,ZESTORETIC) 20-12.5 MG tablet Take 2 tablets by mouth daily.   12/03/2015 at Unknown time  . omeprazole (PRILOSEC) 40 MG capsule Take 40 mg by mouth daily.      . ondansetron (ZOFRAN ODT) 4 MG disintegrating tablet Take 1 tablet (4 mg total) by mouth every 8 (eight) hours as needed for nausea or vomiting. 10 tablet 0   . oxyCODONE-acetaminophen (PERCOCET/ROXICET) 5-325 MG tablet Take 1 tablet by mouth every 8 (eight) hours as needed for severe pain.   12/03/2015 at [place] medication reconciliation was completed with the patient.   Scheduled Inpatient Medications:   . amLODipine  5 mg Oral Daily  . antiseptic oral rinse  7 mL Mouth Rinse q12n4p  . chlorhexidine  15 mL Mouth Rinse BID  . dextromethorphan  30 mg Oral BID   And  . guaiFENesin  600 mg Oral BID  . enoxaparin (LOVENOX) injection  40 mg Subcutaneous Q24H  . lisinopril  20 mg Oral Daily  . sodium chloride flush  3 mL Intravenous Q12H  . tamsulosin  0.4 mg Oral Daily    Continuous Inpatient Infusions:     PRN Inpatient Medications:  acetaminophen, ondansetron **OR** ondansetron (ZOFRAN) IV, oxyCODONE-acetaminophen  Family History: family history includes CAD in his father.  Denies fam hx of colon polyps or CRC.  Social History:   reports that he has never smoked. He does not have any smokeless tobacco history on file. He reports that he drinks alcohol. He reports that he does not use illicit drugs.    Review of Systems: Constitutional: + weight loss.  Eyes: No changes in vision. ENT: No oral lesions, sore throat.  GI: see HPI.  Heme/Lymph: No easy bruising.  CV: No chest pain.  GU: No hematuria.  Integumentary: No rashes.  Neuro: No headaches.  Psych: No depression/anxiety.  Endocrine: No heat/cold intolerance.  Allergic/Immunologic: No urticaria.  Resp: No cough, SOB.  Musculoskeletal: No joint swelling.    Physical Examination: BP 140/71 mmHg  Pulse 66  Temp(Src) 98.1 F (36.7 C) (Oral)  Resp 16  Ht 5\' 8"  (1.727 m)  Wt 178 lb 5 oz (80.882 kg)  BMI 27.12 kg/m2  SpO2 98% Gen: NAD, alert and oriented x 4 HEENT: PEERLA, EOMI, Neck: supple, no JVD or  thyromegaly Chest: CTA bilaterally, no wheezes, crackles, or other adventitious sounds CV: RRR, no m/g/c/r Abd: soft, diffusely TTP, ND, +BS in all four quadrants-hyperactive; no HSM, guarding, ridigity, or rebound tenderness Ext: no edema, well perfused with 2+ pulses, Skin: no rash or lesions noted Lymph: no LAD  Data: Lab Results  Component Value Date   WBC 7.8 01/15/2016   HGB 12.3* 01/15/2016   HCT 36.0* 01/15/2016   MCV 87.4 01/15/2016   PLT 230 01/15/2016    Recent Labs Lab 01/14/16 1952 01/15/16 0419  HGB 11.8* 12.3*   Lab Results  Component Value Date   NA 129* 01/15/2016   K 3.1* 01/15/2016   CL 93* 01/15/2016   CO2 31 01/15/2016   BUN 10 01/15/2016   CREATININE 0.52* 01/15/2016   Lab Results  Component Value Date   ALT 25 01/14/2016   AST 21 01/14/2016   ALKPHOS 64 01/14/2016   BILITOT 1.0 01/14/2016  No results for input(s): APTT, INR, PTT in the last 168 hours.   CT a/p IMPRESSION: 12/03/15 1. No acute findings. 2. Chronic findings include a left renal cyst, colonic diverticula and degenerative changes throughout the visualized spine.   Assessment/Plan: Mr. Frew is a 70 y.o. male admitted for headache, abdominal pain, diarrhea while attempting to prep for colonoscopy.   1. 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 Needs CT-A when clinically feasible and sodium is improved. If CT-A negative then proceed w/ EGD/colonoscopy.   2. Nausea - likely multi factorial partially due to hyponatremia. Improved w/ PPI that was started last week and PRN Zofran. Can continue as needed. Replace electrolytes.  3. Constipation-narcotic. Unclear what he is exactly taking at home-? linzess 1/2 capsule some days and having a BM QOD. Needs bowel regimen while inpatient with at least miralax PRN.  Recommendations:  1. CT-A when hyponatremia improved.   2. Anti-emetics PRN 3.  Miralax PRN  Case discussed w/ Dr. Gustavo Lah.   Thank you for the consult. Please call with questions or concerns.  Ronney Asters, PA-C [place]

## 2016-01-15 NOTE — Care Management (Deleted)
Chest X-ray did not specify TB rule in/out. CSW is paging MD to correct. PPD may still be needed for ALF placement however CSW is working with MD. Valarie Cones will follow along with CSW for progression of this patient.

## 2016-01-15 NOTE — Consult Note (Addendum)
Please see full GI consult by Ms Constance Haw.  Patient seen and examined, chart reviewed.  Patietn was seen in GI as o/p and arranged for colonoscopy, did not tolerate prep and admitted with findings of nausea, hyponatremia, abd pain.  The abdominal pain is occuring about a half hour after a meal.  There has ben some weight loss, these sx beginning about 2-3 months ago.  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  Will order a CTA once the electrolyte imbalance is addressed.  After CTA and disposition, will follow with possible EGD and colonoscopy.    Following with you.  Lollie Sails, MD Gastroenterology

## 2016-01-15 NOTE — Progress Notes (Signed)
[facility] [place] NAME: Benjamin Turner    MR#:  [id number]  DATE OF BIRTH:  10/03/1945  SUBJECTIVE:   Patient is here due to generalized weakness and abdominal pain. Wife at bedside. Patient is also complaining of a headache. Was supposed to get a colonoscopy today for ongoing abdominal pain. Sodium is improved.  REVIEW OF SYSTEMS:    Review of Systems  Constitutional: Negative for fever and chills.  HENT: Negative for congestion and tinnitus.   Eyes: Negative for blurred vision and double vision.  Respiratory: Negative for cough, shortness of breath and wheezing.   Cardiovascular: Negative for chest pain, orthopnea and PND.  Gastrointestinal: Positive for nausea. Negative for vomiting, abdominal pain and diarrhea.  Genitourinary: Negative for dysuria and hematuria.  Neurological: Positive for weakness and headaches. Negative for dizziness, sensory change and focal weakness.  All other systems reviewed and are negative.   Nutrition: Heart healthy Tolerating Diet: Yes Tolerating PT: Evaluation noted   DRUG ALLERGIES:   Allergies  Allergen Reactions  . Codeine Nausea And Vomiting    VITALS:  Blood pressure 140/71, pulse 66, temperature 98.1 F (36.7 C), temperature source Oral, resp. rate 16, height 5\' 8"  (1.727 m), weight 80.882 kg (178 lb 5 oz), SpO2 98 %.  PHYSICAL EXAMINATION:   Physical Exam  GENERAL:  70 y.o.-year-old patient lying in the bed in no acute distress.  EYES: Pupils equal, round, reactive to light and accommodation. No scleral icterus. Extraocular muscles intact.  HEENT: Head atraumatic, normocephalic. Oropharynx and nasopharynx clear.  NECK:  Supple, no jugular venous distention. No thyroid enlargement, no tenderness.  LUNGS: Normal breath sounds bilaterally, no wheezing, rales, rhonchi. No use of accessory muscles of respiration.  CARDIOVASCULAR: S1, S2 normal. No murmurs, rubs, or gallops.  ABDOMEN:  Soft, Tender diffusely, no rebound, rigity, nondistended. Bowel sounds present. No organomegaly or mass.  EXTREMITIES: No cyanosis, clubbing or edema b/l.    NEUROLOGIC: Cranial nerves II through XII are intact. No focal Motor or sensory deficits b/l.  Globally weak PSYCHIATRIC: The patient is alert and oriented x 3.  SKIN: No obvious rash, lesion, or ulcer.    LABORATORY PANEL:   CBC  Recent Labs Lab 01/15/16 0419  WBC 7.8  HGB 12.3*  HCT 36.0*  PLT 230   ------------------------------------------------------------------------------------------------------------------  Chemistries   Recent Labs Lab 01/14/16 1952 01/15/16 0419  NA 124* 129*  K 3.4* 3.1*  CL 85* 93*  CO2 32 31  GLUCOSE 94 90  BUN 14 10  CREATININE 0.61 0.52*  CALCIUM 9.8 9.5  AST 21  --   ALT 25  --   ALKPHOS 64  --   BILITOT 1.0  --    ------------------------------------------------------------------------------------------------------------------  Cardiac Enzymes  Recent Labs Lab 01/14/16 1952  TROPONINI <0.03   ------------------------------------------------------------------------------------------------------------------  RADIOLOGY:  Ct Head Wo Contrast  01/14/2016  CLINICAL DATA:  Initial evaluation for acute severe headache. EXAM: CT HEAD WITHOUT CONTRAST TECHNIQUE: Contiguous axial images were obtained from the base of the skull through the vertex without intravenous contrast. COMPARISON:  Prior study from 09/13/2015. FINDINGS: Generalized cerebral atrophy with chronic small vessel ischemic disease present, similar to prior. No acute intracranial hemorrhage. No acute large vessel territory infarct. No mass lesion, midline shift, or mass effect. No hydrocephalus. No extra-axial fluid collection. Scalp soft tissues within normal limits. No acute abnormality about the orbits. Paranasal sinuses are clear.  No mastoid effusion. Calvarium intact. IMPRESSION: 1. No acute intracranial  process. 2.  Generalized cerebral atrophy with mild chronic small vessel ischemic disease, stable. Electronically Signed   By: Jeannine Boga M.D.   On: 01/14/2016 21:03     ASSESSMENT AND PLAN:   70 year old male with past medical history of hypertension, osteoarthritis, gout, who presents to the hospital due to weakness, dizziness and also noted to be hyponatremic.  1. Hyponatremia-hypovolemic hyponatremia secondary to dehydration. -Improved with IV fluids and will monitor. Continue to hold HCTZ.  2. Generalized weakness-multifactorial and related to underlying deconditioning, hyponatremia. -Improving. Appreciate physical therapy evaluation.  3. Abdominal pain-this is chronic in nature for the patient. He was supposed to get outpatient colonoscopy today. -I will get a gastroenterology consult on the hospital.  4. Essential hypertension-continue Cipro, Norvasc.  5. BPH w/ urinary retention - will d/c Foley and check post-void residual. Started on Flomax.  - refer to Urology as outpatient.   6. Hypokalemia - will replace and repeat level in a.m.  - check mg. Level.      All the records are reviewed and case discussed with Care Management/Social Workerr. Management plans discussed with the patient, family and they are in agreement.  CODE STATUS: Full  DVT Prophylaxis: Lovenox  TOTAL TIME TAKING CARE OF THIS PATIENT: 30 minutes.   POSSIBLE D/C IN 1-2 DAYS, DEPENDING ON CLINICAL CONDITION.   Henreitta Leber M.D on 01/15/2016 at 3:04 PM  Between 7am to 6pm - Pager - [phone]  After 6pm go to [email or web address] - password [email or web address] [facility]  Office  [phone]  CC: Primary care physician; Marguerita Merles, MD

## 2016-01-16 ENCOUNTER — Observation Stay: Payer: Medicare HMO

## 2016-01-16 DIAGNOSIS — E440 Moderate protein-calorie malnutrition: Secondary | ICD-10-CM | POA: Insufficient documentation

## 2016-01-16 LAB — BASIC METABOLIC PANEL
Anion gap: 7 (ref 5–15)
BUN: 16 mg/dL (ref 6–20)
CALCIUM: 9.9 mg/dL (ref 8.9–10.3)
CO2: 27 mmol/L (ref 22–32)
Chloride: 95 mmol/L — ABNORMAL LOW (ref 101–111)
Creatinine, Ser: 0.58 mg/dL — ABNORMAL LOW (ref 0.61–1.24)
GFR calc Af Amer: >60 mL/min (ref >60)
GLUCOSE: 99 mg/dL (ref 65–99)
POTASSIUM: 3.7 mmol/L (ref 3.5–5.1)
Sodium: 129 mmol/L — ABNORMAL LOW (ref 135–145)

## 2016-01-16 LAB — URINE CULTURE: Culture: <10000 — AB

## 2016-01-16 LAB — MAGNESIUM: MAGNESIUM: 1.6 mg/dL — AB (ref 1.7–2.4)

## 2016-01-16 MED ORDER — LISINOPRIL 20 MG PO TABS: 20.0000 mg | ORAL_TABLET | Freq: Every day | ORAL | Status: DC

## 2016-01-16 MED ORDER — TAMSULOSIN HCL 0.4 MG PO CAPS: 0.4000 mg | ORAL_CAPSULE | Freq: Every day | ORAL | Status: AC

## 2016-01-16 MED ORDER — ENSURE ENLIVE PO LIQD: 237.0000 mL | Freq: Two times a day (BID) | ORAL | Status: DC

## 2016-01-16 MED ORDER — IOPAMIDOL (ISOVUE-370) INJECTION 76%
100.0000 mL | Freq: Once | INTRAVENOUS | Status: AC | PRN
Start: 2016-01-16 — End: 2016-01-16
  Administered 2016-01-16: 100 mL via INTRAVENOUS

## 2016-01-16 MED ORDER — MAGNESIUM SULFATE 2 GM/50ML IV SOLN
2.0000 g | Freq: Once | INTRAVENOUS | Status: AC
  Administered 2016-01-16: 2 g via INTRAVENOUS
  Filled 2016-01-16: qty 50

## 2016-01-16 NOTE — Care Management (Signed)
Patient to discharge home today and order present for home health PT and SN.  Notified [facility] of discharge.

## 2016-01-16 NOTE — Progress Notes (Signed)
Initial Nutrition Assessment  DOCUMENTATION CODES:   Non-severe (moderate) malnutrition in context of chronic illness  INTERVENTION:  -Monitor intake and cater to pt preferences. Recommend liberalizing diet to regular secondary to poor po intake -Recommend adding Ensure Enlive po BID, each supplement provides 350 kcal and 20 grams of protein    NUTRITION DIAGNOSIS:   Malnutrition related to altered GI function as evidenced by percent weight loss, energy intake < 75% for > or equal to 1 month.    GOAL:   Patient will meet greater than or equal to 90% of their needs    MONITOR:   PO intake, Supplement acceptance  REASON FOR ASSESSMENT:   Malnutrition Screening Tool    ASSESSMENT:     Pt admitted with abdominal pain, weakness, hyponatremia. Colonscopy was scheduled for outpatient on 5/22 but admitted. 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    Past Medical History  Diagnosis Date  . Hypertension   . Arthritis   . Gout   . Cancer ([place])   . Cervical pain (neck)   . Squamous cell skin cancer     right ankle  . PONV (postoperative nausea and vomiting)    Medications reviewed:  Labs reviewed: Na 129, Mag 1.6  Nutrition-Focused physical exam completed. Findings are mild fat depletion, no muscle depletion, and mild edema.   Pt reports intake over the last 6 months has been decreased with only eating about 50% of normal intake secondary to abdominal pain. Reports eating a little bit better since admission. Drinks ensure prior to admission about 1 or 2 per day.    Diet Order:  Diet Heart Room service appropriate?: Yes; Fluid consistency:: Thin  Skin:   (right ankle wound noted)  Last BM:  5/20  Height:   Ht Readings from Last 1 Encounters:  01/14/16 5\' 8"  (1.727 m)    Weight:  10% wt loss in the last 6 months per pt report.  Pt reports wt loss of 20 pounds in the last 6 months  Wt Readings from  Last 1 Encounters:  01/14/16 178 lb 5 oz (80.882 kg)    Ideal Body Weight:     BMI:  Body mass index is 27.12 kg/(m^2).  Estimated Nutritional Needs:   Kcal:  2000-2400 kcals/d  Protein:  96-120 g/d  Fluid:  >/= 2 L/d  EDUCATION NEEDS:   No education needs identified at this time  Dineen Conradt B. Zenia Resides, [place], [place] (pager) Weekend/On-Call pager [phone])

## 2016-01-16 NOTE — Discharge Summary (Signed)
[place] [facility] [place] NAME: Stacy Sorby    MR#:  [id number]  DATE OF BIRTH:  July 11, 1946  DATE OF ADMISSION:  01/14/2016 ADMITTING PHYSICIAN: Lance Coon, MD  DATE OF DISCHARGE: 01/16/2016  PRIMARY CARE PHYSICIAN: Marguerita Merles, MD    ADMISSION DIAGNOSIS:  Hyponatremia [E87.1] Weakness [R53.1] Other headache syndrome [G44.89]  DISCHARGE DIAGNOSIS:  Principal Problem:   Weakness Active Problems:   Hyponatremia   Essential hypertension   Headache   Malnutrition of moderate degree   SECONDARY DIAGNOSIS:   Past Medical History  Diagnosis Date  . Hypertension   . Arthritis   . Gout   . Cancer ([place])   . Cervical pain (neck)   . Squamous cell skin cancer     right ankle  . PONV (postoperative nausea and vomiting)     HOSPITAL COURSE:   70 year old male with past medical history of hypertension, osteoarthritis, gout, who presents to the hospital due to weakness, dizziness and also noted to be hyponatremic.  1. Hyponatremia-this was hypovolemic hyponatremia secondary to dehydration. -Patient was hydrated with IV fluids and HCTZ was held. His sodium is improved. -he is clinically symptomatic. He has been taken off the HCTZ.  2. Generalized weakness-multifactorial and related to underlying deconditioning, hyponatremia. -This has improved since admission with IV fluid hydration. Patient was seen by physical therapy and recommended home health services which is being arranged for him prior to discharge.  3. Abdominal pain-this is chronic in nature for the patient. He was supposed to get outpatient colonoscopy and therefore gastroenterology consult was obtained. -Patient was seen by Dr. Gustavo Lah he recommended getting a CT angiogram of his abdomen and pelvis which was done. The CT angiogram did not show any evidence of mesenteric ischemia and had nonspecific findings. He is currently tolerating a regular diet well. He will be  discharged home with follow-up with gastroenterology as an outpatient. They will arrange an outpatient endoscopy/colonoscopy next coming weeks.  4. Essential hypertension-stable, he will continue his lisinopril. -he was taken off the HCTZ given the hyponatremia.  5. BPH w/ urinary retention - patient did have a Foley catheter placed while in the hospital. He was started on Flomax and then his Foley was discontinued and now he is voiding well. He will be discharged on Flomax. He should follow-up with a urologist as outpatient.  6. Hypokalemia - improved and resolved with supplementation. His magnesium level was low which is also going to be replaced.  DISCHARGE CONDITIONS:   Stable  CONSULTS OBTAINED:  Treatment Team:  Lollie Sails, MD  DRUG ALLERGIES:   Allergies  Allergen Reactions  . Codeine Nausea And Vomiting    DISCHARGE MEDICATIONS:   Current Discharge Medication List    START taking these medications   Details  lisinopril (PRINIVIL,ZESTRIL) 20 MG tablet Take 1 tablet (20 mg total) by mouth daily. Qty: 60 tablet, Refills: 1    tamsulosin (FLOMAX) 0.4 MG CAPS capsule Take 1 capsule (0.4 mg total) by mouth daily. Qty: 30 capsule, Refills: 1      CONTINUE these medications which have NOT CHANGED   Details  acetaminophen (TYLENOL) 325 MG tablet Take 2 tablets (650 mg total) by mouth every 6 (six) hours as needed for mild pain (or Fever >/= 101).    diclofenac (VOLTAREN) 75 MG EC tablet Take 75 mg by mouth 2 (two) times daily.    feeding supplement, ENSURE ENLIVE, (ENSURE ENLIVE) LIQD Take 237 mLs by mouth 2 (two)  times daily between meals. Qty: 237 mL, Refills: 12    fluticasone (FLONASE) 50 MCG/ACT nasal spray Place 2 sprays into both nostrils daily.    ibuprofen (ADVIL,MOTRIN) 200 MG tablet Take 200 mg by mouth every 6 (six) hours as needed for headache or moderate pain.    LINZESS 290 MCG CAPS capsule Take 290 mcg by mouth daily.    omeprazole (PRILOSEC)  40 MG capsule Take 40 mg by mouth daily.    ondansetron (ZOFRAN ODT) 4 MG disintegrating tablet Take 1 tablet (4 mg total) by mouth every 8 (eight) hours as needed for nausea or vomiting. Qty: 10 tablet, Refills: 0    oxyCODONE-acetaminophen (PERCOCET/ROXICET) 5-325 MG tablet Take 1 tablet by mouth every 8 (eight) hours as needed for severe pain.      STOP taking these medications     lisinopril-hydrochlorothiazide (PRINZIDE,ZESTORETIC) 20-12.5 MG tablet      amLODipine (NORVASC) 5 MG tablet          DISCHARGE INSTRUCTIONS:   DIET:  Cardiac diet  DISCHARGE CONDITION:  Stable  ACTIVITY:  Activity as tolerated  OXYGEN:  Home Oxygen: No.   Oxygen Delivery: room air  DISCHARGE LOCATION:  Home with home health nursing and physical therapy   If you experience worsening of your admission symptoms, develop shortness of breath, life threatening emergency, suicidal or homicidal thoughts you must seek medical attention immediately by calling 911 or calling your MD immediately  if symptoms less severe.  You Must read complete instructions/literature along with all the possible adverse reactions/side effects for all the Medicines you take and that have been prescribed to you. Take any new Medicines after you have completely understood and accpet all the possible adverse reactions/side effects.   Please note  You were cared for by a hospitalist during your hospital stay. If you have any questions about your discharge medications or the care you received while you were in the hospital after you are discharged, you can call the unit and asked to speak with the hospitalist on call if the hospitalist that took care of you is not available. Once you are discharged, your primary care physician will handle any further medical issues. Please note that NO REFILLS for any discharge medications will be authorized once you are discharged, as it is imperative that you return to your primary care  physician (or establish a relationship with a primary care physician if you do not have one) for your aftercare needs so that they can reassess your need for medications and monitor your lab values.     Today   Abdominal pain improved. No nausea, vomiting. Weakness has improved.  VITAL SIGNS:  Blood pressure 138/77, pulse 108, temperature 98 F (36.7 C), temperature source Oral, resp. rate 18, height 5\' 8"  (1.727 m), weight 80.882 kg (178 lb 5 oz), SpO2 99 %.  I/O:   Intake/Output Summary (Last 24 hours) at 01/16/16 1600 Last data filed at 01/16/16 0924  Gross per 24 hour  Intake    483 ml  Output    400 ml  Net     83 ml    PHYSICAL EXAMINATION:  GENERAL:  70 y.o.-year-old patient lying in the bed with no acute distress.  EYES: Pupils equal, round, reactive to light and accommodation. No scleral icterus. Extraocular muscles intact.  HEENT: Head atraumatic, normocephalic. Oropharynx and nasopharynx clear.  NECK:  Supple, no jugular venous distention. No thyroid enlargement, no tenderness.  LUNGS: Normal breath sounds bilaterally, no wheezing, rales,rhonchi.  No use of accessory muscles of respiration.  CARDIOVASCULAR: S1, S2 normal. No murmurs, rubs, or gallops.  ABDOMEN: Soft, non-tender, non-distended. Bowel sounds present. No organomegaly or mass.  EXTREMITIES: No pedal edema, cyanosis, or clubbing.  NEUROLOGIC: Cranial nerves II through XII are intact. No focal motor or sensory defecits b/l.  PSYCHIATRIC: The patient is alert and oriented x 3. Good affect.  SKIN: No obvious rash, lesion, or ulcer.   DATA REVIEW:   CBC  Recent Labs Lab 01/15/16 0419  WBC 7.8  HGB 12.3*  HCT 36.0*  PLT 230    Chemistries   Recent Labs Lab 01/14/16 1952  01/16/16 0502  NA 124*  < > 129*  K 3.4*  < > 3.7  CL 85*  < > 95*  CO2 32  < > 27  GLUCOSE 94  < > 99  BUN 14  < > 16  CREATININE 0.61  < > 0.58*  CALCIUM 9.8  < > 9.9  MG  --   --  1.6*  AST 21  --   --   ALT 25  --    --   ALKPHOS 64  --   --   BILITOT 1.0  --   --   < > = values in this interval not displayed.  Cardiac Enzymes  Recent Labs Lab 01/14/16 1952  TROPONINI <0.03    Microbiology Results  Results for orders placed or performed during the hospital encounter of 01/14/16  Urine culture     Status: Abnormal   Collection Time: 01/14/16  8:06 PM  Result Value Ref Range Status   Specimen Description URINE, CLEAN CATCH  Final   Special Requests NONE  Final   Culture (A)  Final    <10,000 COLONIES/mL INSIGNIFICANT GROWTH Performed at [facility]    Report Status 01/16/2016 FINAL  Final    RADIOLOGY:  Ct Head Wo Contrast  01/14/2016  CLINICAL DATA:  Initial evaluation for acute severe headache. EXAM: CT HEAD WITHOUT CONTRAST TECHNIQUE: Contiguous axial images were obtained from the base of the skull through the vertex without intravenous contrast. COMPARISON:  Prior study from 09/13/2015. FINDINGS: Generalized cerebral atrophy with chronic small vessel ischemic disease present, similar to prior. No acute intracranial hemorrhage. No acute large vessel territory infarct. No mass lesion, midline shift, or mass effect. No hydrocephalus. No extra-axial fluid collection. Scalp soft tissues within normal limits. No acute abnormality about the orbits. Paranasal sinuses are clear.  No mastoid effusion. Calvarium intact. IMPRESSION: 1. No acute intracranial process. 2. Generalized cerebral atrophy with mild chronic small vessel ischemic disease, stable. Electronically Signed   By: Jeannine Boga M.D.   On: 01/14/2016 21:03   Ct Angio Abd/pel W/ And/or W/o  01/16/2016  CLINICAL DATA:  Abdominal pain after eating. Evaluate for mesenteric ischemia. EXAM: CT ANGIOGRAPHY ABDOMEN AND PELVIS TECHNIQUE: Multidetector CT imaging of the abdomen and pelvis was performed using the standard protocol during bolus administration of intravenous contrast. Multiplanar reconstructed images including MIPs were  obtained and reviewed to evaluate the vascular anatomy. CONTRAST:  100 mL Isovue 370 COMPARISON:  Abdominal CT 12/03/2015 FINDINGS: ARTERIAL FINDINGS: Aorta: Mild atherosclerotic plaque throughout the abdominal aorta without aneurysm. Negative for an aortic dissection. Celiac axis: The celiac trunk is widely patent. There is a small amount of calcified plaque at the origin but this does not cause stenosis. Splenic artery, common hepatic artery and left gastric artery are patent. Gastroduodenal artery is patent. Superior mesenteric: Superior mesenteric artery  is widely patent. SMA makes an acute angulation toward the right side but this does not appear to be causing significant stenosis. Main branches of the SMA are patent. Left renal: Single left renal artery is widely patent. Small amount of calcified plaque at the origin that does not cause stenosis. Right renal: Main right renal artery is widely patent with a small amount of calcified plaque near the origin. There is also an inferior accessory right renal artery. Inferior mesenteric: Inferior mesenteric artery is widely patent with minimal plaque in the proximal aspect. Left iliac: Left iliac arteries are widely patent without significant plaque or stenosis. Proximal femoral arteries are patent. Right iliac: Right iliac arteries are patent with minimal plaque but no significant stenosis. Proximal right femoral arteries are patent. Venous findings: Hepatic veins and portal veins are patent. IVC and renal veins are patent. Iliac veins are patent. Review of the MIP images confirms the above findings. NONVASCULAR FINDINGS: Stable scarring in left lower lobe. No pleural effusions. There is a stable pleural-based calcification in the left lower lobe on sequence 4, image 16 that measures 4 mm. There appears to be scarring along the medial aspect of the right middle lobe which is incompletely imaged. There is a poorly defined hypervascular area along the medial right  hepatic lobe. This is best seen on the arterial phase images, sequence 4, image 39. This area roughly measures 2 cm. There is a second small hypervascular focus at the hepatic dome on sequence 4, image 23. Mild distention of the gallbladder without inflammatory changes. There is no significant dilatation of the biliary system. Normal appearance of the pancreas without inflammation or duct dilatation. Normal appearance of spleen without enlargement. Normal appearance of the adrenal glands. Left renal cysts. There is slightly decreased enhancement along the anterior lower pole the left kidney best seen on sequence 4, image 96. This is also demonstrated on the portal venous phase on sequence 10, image 40. Probable punctate cyst in the right kidney. Negative for hydronephrosis. Normal appearance of the urinary bladder. No acute abnormality to the stomach or small bowel. Multiple diverticula involving the colon, particularly the sigmoid colon. Minimal pericolonic stranding around the proximal sigmoid colon but this does not clearly represent an acute inflammatory process. Normal appearance of the prostate and seminal vesicles. Left inguinal hernia containing fat. There is no significant lymphadenopathy in the abdomen or pelvis. No suspicious bone findings. Disc space narrowing at L5-S1. IMPRESSION: Mild atherosclerotic disease in the aorta and visceral arteries. No significant stenosis involving the mesenteric arteries. Small focus of decreased enhancement or low density along the left kidney lower pole. This could represent a small focus of edema and pyelonephritis. Recommend clinical correlation in this area. Two indeterminate hypervascular areas in the liver. These areas are poorly defined and could represent small vascular or enhancing lesions, such as a flash filling hemangioma. These are probably incidental findings unless the patient has a history or concern for malignancy. Multiple colonic diverticula. Minimal  pericolonic stranding around the proximal sigmoid colon. Small focus of diverticulitis cannot be excluded but this finding is equivocal. Electronically Signed   By: Markus Daft M.D.   On: 01/16/2016 14:46      Management plans discussed with the patient, family and they are in agreement.  CODE STATUS:     Code Status Orders        Start     Ordered   01/14/16 2233  Full code   Continuous     01/14/16 2232  Code Status History    Date Active Date Inactive Code Status Order ID Comments User Context   09/14/2015 10:02 PM 09/16/2015  7:42 PM Full Code [id number]  Toy Baker, MD Inpatient      TOTAL TIME TAKING CARE OF THIS PATIENT: 40 minutes.    Henreitta Leber M.D on 01/16/2016 at 4:00 PM  Between 7am to 6pm - Pager - [phone]  After 6pm go to [email or web address] - password [email or web address] [facility]  Office  [phone]  CC: Primary care physician; Marguerita Merles, MD

## 2016-01-16 NOTE — Progress Notes (Signed)
Pt refuses bed alarm. Wife is at bedside. Pt is educated about the importance of the bed alarm for his safety. Will monitor pt.  Angus Seller

## 2016-01-16 NOTE — Consult Note (Signed)
WOC wound consult note Reason for Consult: Healing wound to right lateral malleolus.  S/P skin graft.  Wife performs wound care QOD.   Wound type: Surgical site (graft site to chronic wound) Pressure Ulcer POA: N/A Measurement: 3 cm x 2 cm intact lesion with scabbed perimeter, healing without difficulty.   Wound [id number] scabbing to periwound.  Drainage (amount, consistency, odor) None noted.  Periwound:Intact Dressing procedure/placement/frequency:Apply vaseline gauze to wound bed.  Cover with nonadherent pad.  Wrap with kerlix and secure with ace wrap.  Change every other day.  Wife performs wound care.   Will not follow at this time.  Please re-consult if needed.  Domenic Moras RN BSN [place] Pager [phone]

## 2016-02-19 ENCOUNTER — Ambulatory Visit: Payer: Medicare HMO | Admitting: Anesthesiology

## 2016-02-19 ENCOUNTER — Encounter: Payer: Self-pay | Admitting: Anesthesiology

## 2016-02-19 ENCOUNTER — Ambulatory Visit
Admission: RE | Admit: 2016-02-19 | Discharge: 2016-02-19 | Disposition: A | Discharge status: Home / Self Care | Payer: Medicare HMO | Source: Ambulatory Visit | Attending: Unknown Physician Specialty | Admitting: Unknown Physician Specialty

## 2016-02-19 ENCOUNTER — Encounter
Admission: RE | Disposition: A | Discharge status: Home / Self Care | Payer: Self-pay | Source: Ambulatory Visit | Attending: Unknown Physician Specialty

## 2016-02-19 DIAGNOSIS — Z85828 Personal history of other malignant neoplasm of skin: Secondary | ICD-10-CM | POA: Diagnosis not present

## 2016-02-19 DIAGNOSIS — K5190 Ulcerative colitis, unspecified, without complications: Secondary | ICD-10-CM | POA: Insufficient documentation

## 2016-02-19 DIAGNOSIS — Z791 Long term (current) use of non-steroidal anti-inflammatories (NSAID): Secondary | ICD-10-CM | POA: Insufficient documentation

## 2016-02-19 DIAGNOSIS — K640 First degree hemorrhoids: Secondary | ICD-10-CM | POA: Diagnosis not present

## 2016-02-19 DIAGNOSIS — K2950 Unspecified chronic gastritis without bleeding: Secondary | ICD-10-CM | POA: Diagnosis not present

## 2016-02-19 DIAGNOSIS — K5730 Diverticulosis of large intestine without perforation or abscess without bleeding: Secondary | ICD-10-CM | POA: Diagnosis not present

## 2016-02-19 DIAGNOSIS — Z7951 Long term (current) use of inhaled steroids: Secondary | ICD-10-CM | POA: Diagnosis not present

## 2016-02-19 DIAGNOSIS — Z79899 Other long term (current) drug therapy: Secondary | ICD-10-CM | POA: Insufficient documentation

## 2016-02-19 DIAGNOSIS — K529 Noninfective gastroenteritis and colitis, unspecified: Secondary | ICD-10-CM | POA: Insufficient documentation

## 2016-02-19 DIAGNOSIS — I10 Essential (primary) hypertension: Secondary | ICD-10-CM | POA: Insufficient documentation

## 2016-02-19 DIAGNOSIS — R1033 Periumbilical pain: Secondary | ICD-10-CM | POA: Insufficient documentation

## 2016-02-19 DIAGNOSIS — M1990 Unspecified osteoarthritis, unspecified site: Secondary | ICD-10-CM | POA: Diagnosis not present

## 2016-02-19 HISTORY — PX: ESOPHAGOGASTRODUODENOSCOPY (EGD) WITH PROPOFOL: SHX5813

## 2016-02-19 HISTORY — PX: COLONOSCOPY WITH PROPOFOL: SHX5780

## 2016-02-19 SURGERY — COLONOSCOPY WITH PROPOFOL
Anesthesia: General

## 2016-02-19 MED ORDER — PROPOFOL 10 MG/ML IV BOLUS
INTRAVENOUS | Status: DC | PRN
  Administered 2016-02-19: 20 mg via INTRAVENOUS
  Administered 2016-02-19: 30 mg via INTRAVENOUS
  Administered 2016-02-19 (×3): 20 mg via INTRAVENOUS

## 2016-02-19 MED ORDER — PROPOFOL 500 MG/50ML IV EMUL
INTRAVENOUS | Status: DC | PRN
  Administered 2016-02-19: 100 ug/kg/min via INTRAVENOUS

## 2016-02-19 MED ORDER — SODIUM CHLORIDE 0.9 % IV SOLN
INTRAVENOUS | Status: DC
  Administered 2016-02-19: 1000 mL via INTRAVENOUS

## 2016-02-19 MED ORDER — LIDOCAINE HCL (CARDIAC) 20 MG/ML IV SOLN
INTRAVENOUS | Status: DC | PRN
  Administered 2016-02-19: 50 mg via INTRAVENOUS

## 2016-02-19 MED ORDER — SODIUM CHLORIDE 0.9 % IV SOLN: INTRAVENOUS | Status: DC

## 2016-02-19 NOTE — Anesthesia Preprocedure Evaluation (Addendum)
Anesthesia Evaluation  Patient identified by MRN, date of birth, ID band Patient awake    Reviewed: Allergy & Precautions, NPO status , Patient's Chart, lab work & pertinent test results, reviewed documented beta blocker date and time   Airway Mallampati: II  TM Distance: >3 FB     Dental  (+) Chipped   Pulmonary           Cardiovascular hypertension, Pt. on medications      Neuro/Psych  Headaches,    GI/Hepatic   Endo/Other    Renal/GU      Musculoskeletal  (+) Arthritis ,   Abdominal   Peds  Hematology   Anesthesia Other Findings Gout. Eck movement OK. EKG shows poor Rs. Has had arrythmias for a long time.  Reproductive/Obstetrics                            Anesthesia Physical Anesthesia Plan  ASA: III  Anesthesia Plan: General   Post-op Pain Management:    Induction: Intravenous  Airway Management Planned: Nasal Cannula  Additional Equipment:   Intra-op Plan:   Post-operative Plan:   Informed Consent: I have reviewed the patients History and Physical, chart, labs and discussed the procedure including the risks, benefits and alternatives for the proposed anesthesia with the patient or authorized representative who has indicated his/her understanding and acceptance.     Plan Discussed with: CRNA  Anesthesia Plan Comments:         Anesthesia Quick Evaluation

## 2016-02-19 NOTE — Op Note (Signed)
[facility] Patient Name: Benjamin Turner Procedure Date: 02/19/2016 1:03 PM MRN: [id number] Account #: [id number] Date of Birth: Jul 16, 1946 Admit Type: Outpatient Age: 70 Room: [facility] ENDO ROOM 1 Gender: Male Note Status: Finalized Procedure:            Colonoscopy Indications:          Periumbilical abdominal pain Providers:            Manya Silvas, MD Referring MD:         Marguerita Merles, MD (Referring MD) Medicines:            Propofol per Anesthesia Complications:        No immediate complications. Procedure:            Pre-Anesthesia Assessment:                       - After reviewing the risks and benefits, the patient                        was deemed in satisfactory condition to undergo the                        procedure.                       After obtaining informed consent, the colonoscope was                        passed under direct vision. Throughout the procedure,                        the patient's blood pressure, pulse, and oxygen                        saturations were monitored continuously. The                        Colonoscope was introduced through the anus and                        advanced to the the cecum, identified by appendiceal                        orifice and ileocecal valve. The colonoscopy was                        performed without difficulty. The patient tolerated the                        procedure well. The quality of the bowel preparation                        was good. Findings:      A few medium-mouthed diverticula were found in the sigmoid colon.      Internal hemorrhoids were found during endoscopy. The hemorrhoids were       small and Grade I (internal hemorrhoids that do not prolapse).      Diffuse moderate inflammation characterized by congestion (edema),       erythema, friability and loss of vascularity was found at the ileocecal       valve. Biopsies were  taken with a cold forceps for  histology. Impression:           - Diverticulosis in the sigmoid colon.                       - Internal hemorrhoids.                       - Diffuse moderate inflammation was found at the                        ileocecal valve secondary to colitis. Biopsied. Recommendation:       - Await pathology results. Manya Silvas, MD 02/19/2016 1:42:23 PM This report has been signed electronically. Number of Addenda: 0 Note Initiated On: 02/19/2016 1:03 PM Scope Withdrawal Time: 0 hours 8 minutes 3 seconds  Total Procedure Duration: 0 hours 14 minutes 35 seconds       [facility]

## 2016-02-19 NOTE — Transfer of Care (Signed)
Immediate Anesthesia Transfer of Care Note  Patient: Benjamin BASTA Sr.  Procedure(s) Performed: Procedure(s): COLONOSCOPY WITH PROPOFOL (N/A) ESOPHAGOGASTRODUODENOSCOPY (EGD) WITH PROPOFOL  Patient Location: PACU  Anesthesia Type:MAC  Level of Consciousness: sedated  Airway & Oxygen Therapy: Patient Spontanous Breathing  Post-op Assessment: Report given to RN  Post vital signs: stable  Last Vitals:  Filed Vitals:   02/19/16 1229  BP: 131/86  Pulse: 118  Temp: 35.9 C  Resp: 18    Last Pain: There were no vitals filed for this visit.    Patients Stated Pain Goal: 0 ([id number])  Complications: No apparent anesthesia complications

## 2016-02-19 NOTE — Op Note (Signed)
[facility] Patient Name: Benjamin Turner Procedure Date: 02/19/2016 1:04 PM MRN: [id number] Account #: [id number] Date of Birth: [birth year] Admit Type: Outpatient Age: 70 Room: [facility] ENDO ROOM 3 Gender: Male Note Status: Finalized Procedure:            Upper GI endoscopy Indications:          Periumbilical abdominal pain Providers:            Manya Silvas, MD Referring MD:         Marguerita Merles, MD (Referring MD) Medicines:            Propofol per Anesthesia Complications:        No immediate complications. Procedure:            Pre-Anesthesia Assessment:                       - After reviewing the risks and benefits, the patient                        was deemed in satisfactory condition to undergo the                        procedure.                       After obtaining informed consent, the endoscope was                        passed under direct vision. Throughout the procedure,                        the patient's blood pressure, pulse, and oxygen                        saturations were monitored continuously. The Endoscope                        was introduced through the mouth, and advanced to the                        second part of duodenum. The upper GI endoscopy was                        accomplished without difficulty. The patient tolerated                        the procedure well. Findings:      The examined esophagus was normal. GEJ 41, no stricture or narrrowing.      Diffuse mild inflammation characterized by erythema and granularity was       found in the gastric body and in the gastric antrum. Biopsies were taken       with a cold forceps for histology. Biopsies were taken with a cold       forceps for Helicobacter pylori testing.      The examined duodenum was normal. Impression:           - Normal esophagus.                       - Gastritis. Biopsied.                       -  Normal examined  duodenum. Recommendation:       - Await pathology results.                       - Perform a colonoscopy as previously scheduled. Manya Silvas, MD 02/19/2016 1:18:58 PM This report has been signed electronically. Number of Addenda: 0 Note Initiated On: 02/19/2016 1:04 PM      [facility]

## 2016-02-19 NOTE — H&P (Signed)
Primary Care Physician:  Marguerita Merles, MD Primary Gastroenterologist:  Dr. Vira Agar  Pre-Procedure History & Physical: HPI:  Benjamin BRAZILL Sr. is a 70 y.o. male is here for an endoscopy and colonoscopy.   Past Medical History  Diagnosis Date  . Hypertension   . Arthritis   . Gout   . Cancer ([place])   . Cervical pain (neck)   . Squamous cell skin cancer     right ankle  . PONV (postoperative nausea and vomiting)     Past Surgical History  Procedure Laterality Date  . Lesion removal Right 10/26/2015    Procedure: EXCISION RIGHT ANKLE SKIN ;  Surgeon: Wallace Going, DO;  Location: [facility];  Service: Plastics;  Laterality: Right;  . Skin graft Right 11/08/2015    Procedure: IRRIGATION AND DEBRIDEMENT AND SPLIT THICKNESS SKIN GRAFT RIGHT ANKLE ;  Surgeon: Wallace Going, DO;  Location: [facility];  Service: Plastics;  Laterality: Right;    Prior to Admission medications   Medication Sig Start Date End Date Taking? Authorizing Provider  acetaminophen (TYLENOL) 325 MG tablet Take 2 tablets (650 mg total) by mouth every 6 (six) hours as needed for mild pain (or Fever >/= 101). 09/16/15  Yes [name], MD  diclofenac (VOLTAREN) 75 MG EC tablet Take 75 mg by mouth 2 (two) times daily.   Yes Historical Provider, MD  feeding supplement, ENSURE ENLIVE, (ENSURE ENLIVE) LIQD Take 237 mLs by mouth 2 (two) times daily between meals. 09/16/15  Yes [name], MD  fluticasone (FLONASE) 50 MCG/ACT nasal spray Place 2 sprays into both nostrils daily.   Yes Historical Provider, MD  ibuprofen (ADVIL,MOTRIN) 200 MG tablet Take 200 mg by mouth every 6 (six) hours as needed for headache or moderate pain.   Yes Historical Provider, MD  LINZESS 290 MCG CAPS capsule Take 290 mcg by mouth daily.   Yes Historical Provider, MD  lisinopril (PRINIVIL,ZESTRIL) 20 MG tablet Take 1 tablet (20 mg total) by mouth daily. 01/16/16  Yes Henreitta Leber, MD   omeprazole (PRILOSEC) 40 MG capsule Take 40 mg by mouth daily.   Yes Historical Provider, MD  ondansetron (ZOFRAN ODT) 4 MG disintegrating tablet Take 1 tablet (4 mg total) by mouth every 8 (eight) hours as needed for nausea or vomiting. 12/03/15  Yes Anne-Caroline Mariea Clonts, MD  oxyCODONE-acetaminophen (PERCOCET/ROXICET) 5-325 MG tablet Take 1 tablet by mouth every 8 (eight) hours as needed for severe pain.   Yes Historical Provider, MD  tamsulosin (FLOMAX) 0.4 MG CAPS capsule Take 1 capsule (0.4 mg total) by mouth daily. 01/16/16  Yes Henreitta Leber, MD    Allergies as of 02/14/2016 - Review Complete 01/16/2016  Allergen Reaction Noted  . Codeine Nausea And Vomiting 09/13/2015    Family History  Problem Relation Age of Onset  . CAD Father     Social History   Social History  . Marital Status: Married    Spouse Name: N/A  . Number of Children: N/A  . Years of Education: N/A   Occupational History  . Not on file.   Social History Main Topics  . Smoking status: Never Smoker   . Smokeless tobacco: Never Used  . Alcohol Use: No     Comment: rare  . Drug Use: No  . Sexual Activity: Not on file   Other Topics Concern  . Not on file   Social History Narrative    Review of Systems: See  HPI, otherwise negative ROS  Physical Exam: BP 131/86 mmHg  Pulse 118  Temp(Src) 96.6 F (35.9 C) (Tympanic)  Resp 18  Ht 5\' 8"  (1.727 m)  Wt 77.111 kg (170 lb)  BMI 25.85 kg/m2  SpO2 100% General:   Alert,  pleasant and cooperative in NAD Head:  Normocephalic and atraumatic. Neck: Significant spinal stenosis causing head flexion no masses or thyromegaly. Lungs:  Clear throughout to auscultation.    Heart:  Regular rate and rhythm. Abdomen:  Soft, nontender and nondistended. Normal bowel sounds, without guarding, and without rebound.   Neurologic:  Alert and  oriented x4;  grossly normal neurologically.  Impression/Plan: Benjamin Mayer Sr. is here for an endoscopy and colonoscopy  to be performed for abdominal pain.  Risks, benefits, limitations, and alternatives regarding  endoscopy and colonoscopy have been reviewed with the patient.  Questions have been answered.  All parties agreeable.   Gaylyn Cheers, MD  02/19/2016, 12:59 PM

## 2016-02-19 NOTE — Anesthesia Postprocedure Evaluation (Signed)
Anesthesia Post Note  Patient: ROCKY VITALI Sr.  Procedure(s) Performed: Procedure(s) (LRB): COLONOSCOPY WITH PROPOFOL (N/A) ESOPHAGOGASTRODUODENOSCOPY (EGD) WITH PROPOFOL  Patient location during evaluation: PACU Anesthesia Type: General Level of consciousness: awake and alert Pain management: pain level controlled Vital Signs Assessment: post-procedure vital signs reviewed and stable Respiratory status: spontaneous breathing, nonlabored ventilation, respiratory function stable and patient connected to nasal cannula oxygen Cardiovascular status: blood pressure returned to baseline and stable Postop Assessment: no signs of nausea or vomiting Anesthetic complications: no    Last Vitals:  Filed Vitals:   02/19/16 1359 02/19/16 1409  BP: 117/80 134/91  Pulse: 93 85  Temp:    Resp: 16 16    Last Pain: There were no vitals filed for this visit.               Kaylie Ritter S

## 2016-02-20 ENCOUNTER — Encounter: Payer: Self-pay | Admitting: Unknown Physician Specialty

## 2016-02-22 LAB — SURGICAL PATHOLOGY

## 2016-04-17 ENCOUNTER — Other Ambulatory Visit: Payer: Self-pay | Admitting: Neurosurgery

## 2016-05-06 ENCOUNTER — Encounter (HOSPITAL_COMMUNITY): Payer: Self-pay

## 2016-05-06 ENCOUNTER — Encounter (HOSPITAL_COMMUNITY)
Admission: RE | Admit: 2016-05-06 | Discharge: 2016-05-06 | Disposition: A | Discharge status: Home / Self Care | Payer: Medicare HMO | Source: Ambulatory Visit | Attending: Neurosurgery | Admitting: Neurosurgery

## 2016-05-06 DIAGNOSIS — Z01812 Encounter for preprocedural laboratory examination: Secondary | ICD-10-CM

## 2016-05-06 DIAGNOSIS — R351 Nocturia: Secondary | ICD-10-CM

## 2016-05-06 DIAGNOSIS — M542 Cervicalgia: Secondary | ICD-10-CM | POA: Insufficient documentation

## 2016-05-06 DIAGNOSIS — Z0183 Encounter for blood typing: Secondary | ICD-10-CM | POA: Insufficient documentation

## 2016-05-06 DIAGNOSIS — K219 Gastro-esophageal reflux disease without esophagitis: Secondary | ICD-10-CM

## 2016-05-06 DIAGNOSIS — Z7951 Long term (current) use of inhaled steroids: Secondary | ICD-10-CM | POA: Insufficient documentation

## 2016-05-06 DIAGNOSIS — Z885 Allergy status to narcotic agent status: Secondary | ICD-10-CM

## 2016-05-06 DIAGNOSIS — Z79899 Other long term (current) drug therapy: Secondary | ICD-10-CM | POA: Insufficient documentation

## 2016-05-06 DIAGNOSIS — N401 Enlarged prostate with lower urinary tract symptoms: Secondary | ICD-10-CM | POA: Insufficient documentation

## 2016-05-06 DIAGNOSIS — I10 Essential (primary) hypertension: Secondary | ICD-10-CM

## 2016-05-06 DIAGNOSIS — R262 Difficulty in walking, not elsewhere classified: Secondary | ICD-10-CM | POA: Insufficient documentation

## 2016-05-06 HISTORY — DX: Insomnia, unspecified: G47.00

## 2016-05-06 HISTORY — DX: Benign prostatic hyperplasia with lower urinary tract symptoms: N40.1

## 2016-05-06 HISTORY — DX: Headache, unspecified: R51.9

## 2016-05-06 HISTORY — DX: Cardiac murmur, unspecified: R01.1

## 2016-05-06 HISTORY — DX: Paresthesia of skin: R20.2

## 2016-05-06 HISTORY — DX: Reserved for inherently not codable concepts without codable children: IMO0001

## 2016-05-06 HISTORY — DX: Phlebitis and thrombophlebitis of unspecified site: I80.9

## 2016-05-06 HISTORY — DX: Benign prostatic hyperplasia without lower urinary tract symptoms: N40.0

## 2016-05-06 HISTORY — DX: Unspecified hemorrhoids: K64.9

## 2016-05-06 HISTORY — DX: Spinal stenosis, cervical region: M48.02

## 2016-05-06 HISTORY — DX: Nocturia: R35.1

## 2016-05-06 HISTORY — DX: Headache: R51

## 2016-05-06 HISTORY — DX: Dizziness and giddiness: R42

## 2016-05-06 HISTORY — DX: Anesthesia of skin: R20.0

## 2016-05-06 HISTORY — DX: Gastro-esophageal reflux disease without esophagitis: K21.9

## 2016-05-06 HISTORY — DX: Myelopathy in diseases classified elsewhere: G99.2

## 2016-05-06 LAB — BASIC METABOLIC PANEL
Anion gap: 7 (ref 5–15)
BUN: 16 mg/dL (ref 6–20)
CO2: 31 mmol/L (ref 22–32)
CREATININE: 0.66 mg/dL (ref 0.61–1.24)
Calcium: 10.3 mg/dL (ref 8.9–10.3)
Chloride: 98 mmol/L — ABNORMAL LOW (ref 101–111)
GFR calc Af Amer: >60 mL/min (ref >60)
Glucose, Bld: 96 mg/dL (ref 65–99)
POTASSIUM: 4.3 mmol/L (ref 3.5–5.1)
SODIUM: 136 mmol/L (ref 135–145)

## 2016-05-06 LAB — CBC
HCT: 37.8 % — ABNORMAL LOW (ref 39.0–52.0)
Hemoglobin: 12.6 g/dL — ABNORMAL LOW (ref 13.0–17.0)
MCH: 29.6 pg (ref 26.0–34.0)
MCHC: 33.3 g/dL (ref 30.0–36.0)
MCV: 88.7 fL (ref 78.0–100.0)
PLATELETS: 230 10*3/uL (ref 150–400)
RBC: 4.26 MIL/uL (ref 4.22–5.81)
RDW: 15.1 % (ref 11.5–15.5)
WBC: 7.4 10*3/uL (ref 4.0–10.5)

## 2016-05-06 LAB — ABO/RH: ABO/RH(D): O POS

## 2016-05-06 LAB — TYPE AND SCREEN
ABO/RH(D): O POS
ANTIBODY SCREEN: NEGATIVE

## 2016-05-06 LAB — SURGICAL PCR SCREEN
MRSA, PCR: NEGATIVE
Staphylococcus aureus: POSITIVE — AB

## 2016-05-06 NOTE — Progress Notes (Signed)
Pt denies any new/acute cardiopulmonary issues. Pt denies being under the care of a cardiologist. Pt denies having any recent labs. Pt denies having a stress test and cardiac cath. Pt chart forwarded to anesthesia to review EKG and anesthesia complication ( problems turning and tilting head).

## 2016-05-06 NOTE — Pre-Procedure Instructions (Signed)
[place]  05/06/2016      [place] [place] [id number] [place] [address] Phone: [phone] Fax: [facility] [place], [place] - [address], [id number], [address] [place] [address] Phone: [phone] Fax: [phone]    Your procedure is scheduled on Thursday, May 09, 2016  Report to [facility] Admitting at 9:30 A.M. ( per MD)  Call this number if you have problems the morning of surgery:  [phone]   Remember:  Do not eat food or drink liquids after midnight Wednesday, May 08, 2016  Take these medicines the morning of surgery with A SIP OF WATER : Omeprazole ( Prilosec), Tamsulosin     ( Flomax), if needed: Oxycodone for pain, Zofran for nausea and Vomiting, Flonase Nasal Spray for allergies Stop taking Aspirin, vitamins, fish oil and herbal medications. Do not take any NSAIDs ie: Ibuprofen, Advil, Naproxen, BC and Goody Powder or any medication containing Aspirin such as Voltaren ( Diclofenac); stop now.  Do not wear jewelry, make-up or nail polish.  Do not wear lotions, powders, or perfumes, or deoderant.  Do not shave 48 hours prior to surgery.  Men may shave face and neck.  Do not bring valuables to the hospital.  [facility] is not responsible for any belongings or valuables.  Contacts, dentures or bridgework may not be worn into surgery.  Leave your suitcase in the car.  After surgery it may be brought to your room.  For patients admitted to the hospital, discharge time will be determined by your treatment team.  Patients discharged the day of surgery will not be allowed to drive home.   Name and phone number of your driver:   Special instructions: [place] - Preparing for Surgery  Before surgery, you can play an important role.  Because skin is not sterile, your skin needs to be as free of  germs as possible.  You can reduce the number of germs on you skin by washing with CHG (chlorahexidine gluconate) soap before surgery.  CHG is an antiseptic cleaner which kills germs and bonds with the skin to continue killing germs even after washing.  Please DO NOT use if you have an allergy to CHG or antibacterial soaps.  If your skin becomes reddened/irritated stop using the CHG and inform your nurse when you arrive at Short Stay.  Do not shave (including legs and underarms) for at least 48 hours prior to the first CHG shower.  You may shave your face.  Please follow these instructions carefully:   1.  Shower with CHG Soap the night before surgery and the morning of Surgery.  2.  If you choose to wash your hair, wash your hair first as usual with your normal shampoo.  3.  After you shampoo, rinse your hair and body thoroughly to remove the Shampoo.  4.  Use CHG as you would any other liquid soap.  You can apply chg directly  to the skin and wash gently with scrungie or a clean washcloth.  5.  Apply the CHG Soap to your body ONLY FROM THE NECK DOWN.  Do not use on open wounds or open sores.  Avoid contact with your eyes, ears, mouth and genitals (private parts).  Wash genitals (private parts) with your normal soap.  6.  Wash thoroughly, paying special attention to the area  where your surgery will be performed.  7.  Thoroughly rinse your body with warm water from the neck down.  8.  DO NOT shower/wash with your normal soap after using and rinsing off the CHG Soap.  9.  Pat yourself dry with a clean towel.            10.  Wear clean pajamas.            11.  Place clean sheets on your bed the night of your first shower and do not sleep with pets.  Day of Surgery  Do not apply any lotions/deodorants the morning of surgery.  Please wear clean clothes to the hospital/surgery center.  Please read over the following fact sheets that you were given. Pain Booklet, Coughing and Deep Breathing, Blood  Transfusion Information, MRSA Information and Surgical Site Infection Prevention

## 2016-05-07 NOTE — Progress Notes (Signed)
Anesthesia Chart Review: Patient is a 70 year old male scheduled for laminectomy, lateral mass fusion C3-6 on 05/09/16 by Dr. Kathyrn Sheriff.  History includes excision or right ankle skin 10/26/15 and I&D with split thickness skin graft right ankle 11/08/15 (done at [facility]; no signed anesthesia records found). Admitted 08/2015 for cervical spinal cord compression (see c-spine MRI report below) with worsening symptoms of RUE weakness and gait instability. Brain MRI showed remote lacunar infarcts. He was seen by Dr. Kathyrn Sheriff then with surgery anticipated; however, he was having issues with hyponatremia and possible infected ankle and back wounds (from skin lesion excisions at [facility] in [place]; reported ankle pathology showed SCC) and needed to be medically optimized prior to proceeding. He was since undergone two ankle surgeries. Last admission 01/14/16 for weakness, hyponatremia, and headache. HCTZ held. He had a colonoscopy in June that showed ileitis and colitis, and was started on Entocort, Cipro, and Flagyl. He was also on Linzess.  For anesthesia concerns, he reported issues with turning and tilting his head.  PCP is listed as Dr. Delight Stare.  Meds include Voltaren, Flonase, Linzess, lisinopril, Prilosec, Zofran, Percocet, Flomax.  BP (!) 144/79   Pulse 97   Temp 36.5 C (Oral)   Resp 18   Wt 176 lb (79.8 kg)   SpO2 100%   BMI 26.76 kg/m   01/14/16 EKG: SR, PACs, inferior infarct (cited on or before 09/13/15). Baseline wanderer in V3  09/15/15 Echo: Study Conclusions - Left ventricle: The cavity size was normal. Wall thickness was   normal. Systolic function was normal. The estimated ejection   fraction was in the range of 60% to 65%. Wall motion was normal;   there were no regional wall motion abnormalities. The study is   not technically sufficient to allow evaluation of LV diastolic   function. - Aortic valve: There was mild regurgitation. - Mitral valve: There was mild  regurgitation. - Left atrium: The atrium was moderately dilated.  09/13/15 MRI C-spine: IMPRESSION: 1. 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 No definite cord signal abnormality, but limited by patient motion. 2. Multilevel foraminal stenosis, advanced from C3-4 to C5-6. 3. Dorsal epidural cystic change at C2/C3 is likely degenerative ganglion, less likely hematoma. This does not significantly contribute to the described canal stenosis.  09/13/15 [id number]: Mild right middle lobe region atelectatic change. Lungs elsewhere clear. Heart upper normal in size.   Preoperative labs noted. T&S done.   He underwent surgical procedures earlier this year. EKG appears stable. Echo showed normal LVEF with no wall motion abnormalities. No acute cardiopulmonary symptoms reported at PAT. Hyponatremia improved. If no acute changes then I anticipate that he can proceed as planned.  George Hugh [facility] Short Stay [facility]/Anesthesiology Phone [phone] 05/07/2016 2:31 PM

## 2016-05-09 ENCOUNTER — Inpatient Hospital Stay (HOSPITAL_COMMUNITY): Payer: Medicare HMO

## 2016-05-09 ENCOUNTER — Encounter (HOSPITAL_COMMUNITY)
Admission: RE | Disposition: A | Discharge status: Skilled Nursing Facility | Payer: Self-pay | Source: Ambulatory Visit | Attending: Neurosurgery

## 2016-05-09 ENCOUNTER — Inpatient Hospital Stay (HOSPITAL_COMMUNITY): Payer: Medicare HMO | Admitting: Vascular Surgery

## 2016-05-09 ENCOUNTER — Inpatient Hospital Stay (HOSPITAL_COMMUNITY)
Admission: RE | Admit: 2016-05-09 | Discharge: 2016-05-15 | DRG: 473 | Disposition: A | Discharge status: Skilled Nursing Facility | Payer: Medicare HMO | Source: Ambulatory Visit | Attending: Neurosurgery | Admitting: Neurosurgery

## 2016-05-09 ENCOUNTER — Inpatient Hospital Stay (HOSPITAL_COMMUNITY): Payer: Medicare HMO | Admitting: Certified Registered Nurse Anesthetist

## 2016-05-09 ENCOUNTER — Encounter (HOSPITAL_COMMUNITY): Payer: Self-pay | Admitting: General Practice

## 2016-05-09 DIAGNOSIS — Z85828 Personal history of other malignant neoplasm of skin: Secondary | ICD-10-CM | POA: Diagnosis not present

## 2016-05-09 DIAGNOSIS — M4802 Spinal stenosis, cervical region: Secondary | ICD-10-CM | POA: Diagnosis present

## 2016-05-09 DIAGNOSIS — M542 Cervicalgia: Secondary | ICD-10-CM

## 2016-05-09 DIAGNOSIS — Z791 Long term (current) use of non-steroidal anti-inflammatories (NSAID): Secondary | ICD-10-CM | POA: Diagnosis not present

## 2016-05-09 DIAGNOSIS — G959 Disease of spinal cord, unspecified: Secondary | ICD-10-CM | POA: Diagnosis present

## 2016-05-09 DIAGNOSIS — M4322 Fusion of spine, cervical region: Secondary | ICD-10-CM

## 2016-05-09 DIAGNOSIS — I10 Essential (primary) hypertension: Secondary | ICD-10-CM | POA: Diagnosis present

## 2016-05-09 DIAGNOSIS — S064X9A Epidural hemorrhage with loss of consciousness of unspecified duration, initial encounter: Secondary | ICD-10-CM

## 2016-05-09 DIAGNOSIS — K219 Gastro-esophageal reflux disease without esophagitis: Secondary | ICD-10-CM | POA: Diagnosis present

## 2016-05-09 DIAGNOSIS — Z419 Encounter for procedure for purposes other than remedying health state, unspecified: Secondary | ICD-10-CM

## 2016-05-09 DIAGNOSIS — N400 Enlarged prostate without lower urinary tract symptoms: Secondary | ICD-10-CM | POA: Diagnosis present

## 2016-05-09 DIAGNOSIS — S064XAA Epidural hemorrhage with loss of consciousness status unknown, initial encounter: Secondary | ICD-10-CM

## 2016-05-09 DIAGNOSIS — Z79899 Other long term (current) drug therapy: Secondary | ICD-10-CM

## 2016-05-09 DIAGNOSIS — M4712 Other spondylosis with myelopathy, cervical region: Secondary | ICD-10-CM | POA: Diagnosis present

## 2016-05-09 DIAGNOSIS — M6281 Muscle weakness (generalized): Secondary | ICD-10-CM

## 2016-05-09 HISTORY — PX: POSTERIOR CERVICAL FUSION/FORAMINOTOMY: SHX5038

## 2016-05-09 SURGERY — POSTERIOR CERVICAL FUSION/FORAMINOTOMY LEVEL 3
Anesthesia: General | Site: Spine Cervical

## 2016-05-09 MED ORDER — TAMSULOSIN HCL 0.4 MG PO CAPS
0.4000 mg | ORAL_CAPSULE | Freq: Every day | ORAL | Status: DC
  Administered 2016-05-10 – 2016-05-15 (×6): 0.4 mg via ORAL
  Filled 2016-05-09 (×6): qty 1

## 2016-05-09 MED ORDER — CEFAZOLIN SODIUM-DEXTROSE 2-4 GM/100ML-% IV SOLN
2.0000 g | INTRAVENOUS | Status: AC
  Administered 2016-05-09: 2 g via INTRAVENOUS

## 2016-05-09 MED ORDER — ALBUMIN HUMAN 5 % IV SOLN
INTRAVENOUS | Status: DC | PRN
  Administered 2016-05-09 (×2): via INTRAVENOUS

## 2016-05-09 MED ORDER — BISACODYL 10 MG RE SUPP: 10.0000 mg | Freq: Every day | RECTAL | Status: DC | PRN

## 2016-05-09 MED ORDER — PROPOFOL 10 MG/ML IV BOLUS
INTRAVENOUS | Status: AC
  Filled 2016-05-09: qty 20

## 2016-05-09 MED ORDER — CHLORHEXIDINE GLUCONATE CLOTH 2 % EX PADS: 6.0000 | MEDICATED_PAD | Freq: Once | CUTANEOUS | Status: DC

## 2016-05-09 MED ORDER — SODIUM CHLORIDE 0.9% FLUSH: 3.0000 mL | INTRAVENOUS | Status: DC | PRN

## 2016-05-09 MED ORDER — CEFAZOLIN SODIUM-DEXTROSE 2-4 GM/100ML-% IV SOLN
2.0000 g | Freq: Three times a day (TID) | INTRAVENOUS | Status: AC
  Administered 2016-05-09 – 2016-05-10 (×2): 2 g via INTRAVENOUS
  Filled 2016-05-09 (×2): qty 100

## 2016-05-09 MED ORDER — LIDOCAINE HCL (CARDIAC) 20 MG/ML IV SOLN
INTRAVENOUS | Status: DC | PRN
Start: 2016-05-09 — End: 2016-05-09
  Administered 2016-05-09: 100 mg via INTRAVENOUS

## 2016-05-09 MED ORDER — DIAZEPAM 5 MG PO TABS
5.0000 mg | ORAL_TABLET | Freq: Three times a day (TID) | ORAL | Status: DC
  Administered 2016-05-09 – 2016-05-15 (×19): 5 mg via ORAL
  Filled 2016-05-09 (×18): qty 1

## 2016-05-09 MED ORDER — ONDANSETRON HCL 4 MG/2ML IJ SOLN
INTRAMUSCULAR | Status: DC | PRN
  Administered 2016-05-09: 4 mg via INTRAVENOUS

## 2016-05-09 MED ORDER — SUGAMMADEX SODIUM 200 MG/2ML IV SOLN
INTRAVENOUS | Status: DC | PRN
  Administered 2016-05-09: 200 mg via INTRAVENOUS

## 2016-05-09 MED ORDER — SURGIFOAM 100 EX MISC
CUTANEOUS | Status: DC | PRN
  Administered 2016-05-09: 15:00:00 via TOPICAL

## 2016-05-09 MED ORDER — DEXAMETHASONE SODIUM PHOSPHATE 10 MG/ML IJ SOLN
INTRAMUSCULAR | Status: AC
  Filled 2016-05-09: qty 1

## 2016-05-09 MED ORDER — ONDANSETRON HCL 4 MG/2ML IJ SOLN
INTRAMUSCULAR | Status: AC
  Filled 2016-05-09: qty 2

## 2016-05-09 MED ORDER — HYDROMORPHONE HCL 1 MG/ML IJ SOLN: 0.5000 mg | INTRAMUSCULAR | Status: DC | PRN

## 2016-05-09 MED ORDER — LIDOCAINE-EPINEPHRINE 1 %-1:100000 IJ SOLN
INTRAMUSCULAR | Status: DC | PRN
  Administered 2016-05-09: 4.5 mL

## 2016-05-09 MED ORDER — ROCURONIUM BROMIDE 100 MG/10ML IV SOLN
INTRAVENOUS | Status: DC | PRN
  Administered 2016-05-09: 35 mg via INTRAVENOUS
  Administered 2016-05-09: 20 mg via INTRAVENOUS
  Administered 2016-05-09: 10 mg via INTRAVENOUS
  Administered 2016-05-09: 25 mg via INTRAVENOUS

## 2016-05-09 MED ORDER — SODIUM CHLORIDE 0.9% FLUSH
3.0000 mL | Freq: Two times a day (BID) | INTRAVENOUS | Status: DC
  Administered 2016-05-10 – 2016-05-15 (×11): 3 mL via INTRAVENOUS

## 2016-05-09 MED ORDER — SODIUM CHLORIDE 0.9 % IR SOLN
Status: DC | PRN
  Administered 2016-05-09: 15:00:00

## 2016-05-09 MED ORDER — ACETAMINOPHEN 325 MG PO TABS: 650.0000 mg | ORAL_TABLET | ORAL | Status: DC | PRN

## 2016-05-09 MED ORDER — THROMBIN 5000 UNITS EX SOLR
OROMUCOSAL | Status: DC | PRN
  Administered 2016-05-09 (×2): via TOPICAL

## 2016-05-09 MED ORDER — MENTHOL 3 MG MT LOZG: 1.0000 | LOZENGE | OROMUCOSAL | Status: DC | PRN

## 2016-05-09 MED ORDER — FENTANYL CITRATE (PF) 100 MCG/2ML IJ SOLN
INTRAMUSCULAR | Status: AC
  Filled 2016-05-09: qty 2

## 2016-05-09 MED ORDER — BACITRACIN ZINC 500 UNIT/GM EX OINT
TOPICAL_OINTMENT | CUTANEOUS | Status: DC | PRN
  Administered 2016-05-09: 1 via TOPICAL

## 2016-05-09 MED ORDER — FLUTICASONE PROPIONATE 50 MCG/ACT NA SUSP
2.0000 | Freq: Every day | NASAL | Status: DC | PRN
  Filled 2016-05-09: qty 16

## 2016-05-09 MED ORDER — DEXAMETHASONE SODIUM PHOSPHATE 10 MG/ML IJ SOLN
INTRAMUSCULAR | Status: DC | PRN
  Administered 2016-05-09: 10 mg via INTRAVENOUS

## 2016-05-09 MED ORDER — HYDROMORPHONE HCL 1 MG/ML IJ SOLN
INTRAMUSCULAR | Status: AC
  Filled 2016-05-09: qty 1

## 2016-05-09 MED ORDER — SODIUM CHLORIDE 0.9 % IV SOLN: INTRAVENOUS | Status: DC

## 2016-05-09 MED ORDER — ONDANSETRON HCL 4 MG/2ML IJ SOLN: 4.0000 mg | Freq: Once | INTRAMUSCULAR | Status: DC | PRN

## 2016-05-09 MED ORDER — BUPIVACAINE HCL (PF) 0.5 % IJ SOLN
INTRAMUSCULAR | Status: DC | PRN
  Administered 2016-05-09: 4.5 mL

## 2016-05-09 MED ORDER — LISINOPRIL 20 MG PO TABS
20.0000 mg | ORAL_TABLET | Freq: Every day | ORAL | Status: DC
  Administered 2016-05-10 – 2016-05-15 (×5): 20 mg via ORAL
  Filled 2016-05-09 (×6): qty 1

## 2016-05-09 MED ORDER — PROPOFOL 10 MG/ML IV BOLUS
INTRAVENOUS | Status: DC | PRN
  Administered 2016-05-09: 50 mg via INTRAVENOUS

## 2016-05-09 MED ORDER — DIAZEPAM 5 MG PO TABS
ORAL_TABLET | ORAL | Status: AC
  Filled 2016-05-09: qty 1

## 2016-05-09 MED ORDER — OXYCODONE-ACETAMINOPHEN 5-325 MG PO TABS
1.0000 | ORAL_TABLET | ORAL | Status: DC | PRN
  Administered 2016-05-09 – 2016-05-15 (×18): 2 via ORAL
  Filled 2016-05-09 (×17): qty 2

## 2016-05-09 MED ORDER — ONDANSETRON 4 MG PO TBDP: 4.0000 mg | ORAL_TABLET | Freq: Three times a day (TID) | ORAL | Status: DC | PRN

## 2016-05-09 MED ORDER — LINACLOTIDE 290 MCG PO CAPS
290.0000 ug | ORAL_CAPSULE | Freq: Every day | ORAL | Status: DC
  Filled 2016-05-09 (×6): qty 1

## 2016-05-09 MED ORDER — ACETAMINOPHEN 650 MG RE SUPP: 650.0000 mg | RECTAL | Status: DC | PRN

## 2016-05-09 MED ORDER — DICLOFENAC SODIUM 75 MG PO TBEC
75.0000 mg | DELAYED_RELEASE_TABLET | Freq: Two times a day (BID) | ORAL | Status: DC
  Administered 2016-05-09 – 2016-05-15 (×12): 75 mg via ORAL
  Filled 2016-05-09 (×14): qty 1

## 2016-05-09 MED ORDER — PHENYLEPHRINE 40 MCG/ML (10ML) SYRINGE FOR IV PUSH (FOR BLOOD PRESSURE SUPPORT)
PREFILLED_SYRINGE | INTRAVENOUS | Status: AC
  Filled 2016-05-09: qty 10

## 2016-05-09 MED ORDER — HYDROMORPHONE HCL 1 MG/ML IJ SOLN
0.5000 mg | INTRAMUSCULAR | Status: AC | PRN
  Administered 2016-05-09 (×4): 0.5 mg via INTRAVENOUS

## 2016-05-09 MED ORDER — PHENYLEPHRINE HCL 10 MG/ML IJ SOLN
INTRAVENOUS | Status: DC | PRN
  Administered 2016-05-09: 20 ug/min via INTRAVENOUS

## 2016-05-09 MED ORDER — DOCUSATE SODIUM 100 MG PO CAPS
100.0000 mg | ORAL_CAPSULE | Freq: Two times a day (BID) | ORAL | Status: DC
  Administered 2016-05-09 – 2016-05-11 (×5): 100 mg via ORAL
  Filled 2016-05-09 (×9): qty 1

## 2016-05-09 MED ORDER — SUGAMMADEX SODIUM 200 MG/2ML IV SOLN
INTRAVENOUS | Status: AC
  Filled 2016-05-09: qty 2

## 2016-05-09 MED ORDER — ENSURE ENLIVE PO LIQD
237.0000 mL | Freq: Two times a day (BID) | ORAL | Status: DC
  Administered 2016-05-10 (×2): 237 mL via ORAL
  Filled 2016-05-09 (×4): qty 237

## 2016-05-09 MED ORDER — FENTANYL CITRATE (PF) 100 MCG/2ML IJ SOLN
INTRAMUSCULAR | Status: DC | PRN
  Administered 2016-05-09 (×4): 50 ug via INTRAVENOUS

## 2016-05-09 MED ORDER — 0.9 % SODIUM CHLORIDE (POUR BTL) OPTIME
TOPICAL | Status: DC | PRN
  Administered 2016-05-09: 1000 mL

## 2016-05-09 MED ORDER — SODIUM CHLORIDE 0.9 % IV SOLN
250.0000 mL | INTRAVENOUS | Status: DC
  Administered 2016-05-09: 250 mL via INTRAVENOUS

## 2016-05-09 MED ORDER — OXYCODONE-ACETAMINOPHEN 5-325 MG PO TABS
ORAL_TABLET | ORAL | Status: AC
  Filled 2016-05-09: qty 2

## 2016-05-09 MED ORDER — SENNA 8.6 MG PO TABS
1.0000 | ORAL_TABLET | Freq: Two times a day (BID) | ORAL | Status: DC
  Administered 2016-05-09 – 2016-05-11 (×5): 8.6 mg via ORAL
  Filled 2016-05-09 (×5): qty 1

## 2016-05-09 MED ORDER — HYDROMORPHONE HCL 1 MG/ML IJ SOLN
INTRAMUSCULAR | Status: AC
Start: 2016-05-09 — End: 2016-05-10
  Filled 2016-05-09: qty 1

## 2016-05-09 MED ORDER — PANTOPRAZOLE SODIUM 40 MG PO TBEC
80.0000 mg | DELAYED_RELEASE_TABLET | Freq: Every day | ORAL | Status: DC
  Administered 2016-05-10: 80 mg via ORAL
  Filled 2016-05-09: qty 2

## 2016-05-09 MED ORDER — PHENOL 1.4 % MT LIQD: 1.0000 | OROMUCOSAL | Status: DC | PRN

## 2016-05-09 MED ORDER — LACTATED RINGERS IV SOLN
INTRAVENOUS | Status: DC
  Administered 2016-05-09 (×4): via INTRAVENOUS

## 2016-05-09 MED ORDER — CEFAZOLIN SODIUM-DEXTROSE 2-4 GM/100ML-% IV SOLN
INTRAVENOUS | Status: AC
  Filled 2016-05-09: qty 100

## 2016-05-09 MED ORDER — SUCCINYLCHOLINE CHLORIDE 20 MG/ML IJ SOLN
INTRAMUSCULAR | Status: DC | PRN
  Administered 2016-05-09: 80 mg via INTRAVENOUS

## 2016-05-09 MED ORDER — HYDROMORPHONE HCL 1 MG/ML IJ SOLN
0.5000 mg | INTRAMUSCULAR | Status: DC | PRN
  Administered 2016-05-09 – 2016-05-12 (×11): 1 mg via INTRAVENOUS
  Filled 2016-05-09 (×11): qty 1

## 2016-05-09 MED ORDER — ONDANSETRON HCL 4 MG/2ML IJ SOLN
4.0000 mg | INTRAMUSCULAR | Status: DC | PRN
  Administered 2016-05-09 – 2016-05-10 (×3): 4 mg via INTRAVENOUS
  Filled 2016-05-09 (×4): qty 2

## 2016-05-09 SURGICAL SUPPLY — 63 items
BAG DECANTER FOR FLEXI CONT (MISCELLANEOUS) ×3 IMPLANT
BENZOIN TINCTURE PRP APPL 2/3 (GAUZE/BANDAGES/DRESSINGS) ×6 IMPLANT
BIT DRILL 2.4X (BIT) ×1 IMPLANT
BIT DRL 2.4X (BIT) ×1
BLADE CLIPPER SURG (BLADE) ×3 IMPLANT
BLADE ULTRA TIP 2M (BLADE) IMPLANT
BUR MATCHSTICK NEURO 3.0 LAGG (BURR) ×3 IMPLANT
BUR PRECISION FLUTE 5.0 (BURR) ×3 IMPLANT
CANISTER SUCT 3000ML PPV (MISCELLANEOUS) ×3 IMPLANT
CLOSURE WOUND 1/2 X4 (GAUZE/BANDAGES/DRESSINGS)
CONT SPEC 4OZ CLIKSEAL STRL BL (MISCELLANEOUS) ×3 IMPLANT
COVER BACK TABLE 60X90IN (DRAPES) ×3 IMPLANT
DECANTER SPIKE VIAL GLASS SM (MISCELLANEOUS) ×3 IMPLANT
DERMABOND ADVANCED (GAUZE/BANDAGES/DRESSINGS) ×2
DERMABOND ADVANCED .7 DNX12 (GAUZE/BANDAGES/DRESSINGS) ×1 IMPLANT
DRAPE C-ARM 42X72 X-RAY (DRAPES) ×6 IMPLANT
DRAPE LAPAROTOMY 100X72 PEDS (DRAPES) ×3 IMPLANT
DRAPE POUCH INSTRU U-SHP 10X18 (DRAPES) ×3 IMPLANT
DRILL BIT (BIT) ×2
DRSG OPSITE POSTOP 4X6 (GAUZE/BANDAGES/DRESSINGS) ×3 IMPLANT
DURAPREP 6ML APPLICATOR 50/CS (WOUND CARE) ×3 IMPLANT
ELECT REM PT RETURN 9FT ADLT (ELECTROSURGICAL) ×3
ELECTRODE REM PT RTRN 9FT ADLT (ELECTROSURGICAL) ×1 IMPLANT
GAUZE SPONGE 4X4 12PLY STRL (GAUZE/BANDAGES/DRESSINGS) IMPLANT
GAUZE SPONGE 4X4 16PLY XRAY LF (GAUZE/BANDAGES/DRESSINGS) ×6 IMPLANT
GLOVE BIOGEL PI IND STRL 7.5 (GLOVE) ×5 IMPLANT
GLOVE BIOGEL PI INDICATOR 7.5 (GLOVE) ×10
GLOVE ECLIPSE 7.0 STRL STRAW (GLOVE) ×6 IMPLANT
GLOVE EXAM NITRILE XL STR (GLOVE) IMPLANT
GLOVE SS BIOGEL STRL SZ 7 (GLOVE) ×3 IMPLANT
GLOVE SUPERSENSE BIOGEL SZ 7 (GLOVE) ×6
GOWN STRL REUS W/ TWL LRG LVL3 (GOWN DISPOSABLE) ×1 IMPLANT
GOWN STRL REUS W/ TWL XL LVL3 (GOWN DISPOSABLE) ×2 IMPLANT
GOWN STRL REUS W/TWL 2XL LVL3 (GOWN DISPOSABLE) IMPLANT
GOWN STRL REUS W/TWL LRG LVL3 (GOWN DISPOSABLE) ×2
GOWN STRL REUS W/TWL XL LVL3 (GOWN DISPOSABLE) ×4
GRAFT BN 5X1XSPNE CVD POST DBM (Bone Implant) ×1 IMPLANT
GRAFT BONE MAGNIFUSE 1X5CM (Bone Implant) ×2 IMPLANT
HEMOSTAT POWDER SURGIFOAM 1G (HEMOSTASIS) ×6 IMPLANT
KIT BASIN OR (CUSTOM PROCEDURE TRAY) ×3 IMPLANT
KIT ROOM TURNOVER OR (KITS) ×3 IMPLANT
NEEDLE HYPO 25X1 1.5 SAFETY (NEEDLE) ×3 IMPLANT
NS IRRIG 1000ML POUR BTL (IV SOLUTION) ×3 IMPLANT
PACK LAMINECTOMY NEURO (CUSTOM PROCEDURE TRAY) ×3 IMPLANT
PAD ARMBOARD 7.5X6 YLW CONV (MISCELLANEOUS) ×9 IMPLANT
PIN MAYFIELD SKULL DISP (PIN) ×3 IMPLANT
ROD VERTEX 60MM (Rod) ×6 IMPLANT
SCREW 3.5X12 (Screw) ×16 IMPLANT
SCREW BN 12X3.5XMA SPNE (Screw) ×8 IMPLANT
SCREW SET M6 (Screw) ×24 IMPLANT
SEALER BIPOLAR AQUA 2.3 (INSTRUMENTS) ×3 IMPLANT
SPONGE LAP 4X18 X RAY DECT (DISPOSABLE) IMPLANT
SPONGE SURGIFOAM ABS GEL 100 (HEMOSTASIS) ×3 IMPLANT
STAPLER SKIN PROX WIDE 3.9 (STAPLE) ×3 IMPLANT
STRIP CLOSURE SKIN 1/2X4 (GAUZE/BANDAGES/DRESSINGS) IMPLANT
SUT ETHILON 3 0 FSL (SUTURE) IMPLANT
SUT VIC AB 0 CT1 18XCR BRD8 (SUTURE) ×3 IMPLANT
SUT VIC AB 0 CT1 8-18 (SUTURE) ×6
SUT VIC AB 2-0 CT1 18 (SUTURE) ×3 IMPLANT
SUT VICRYL 3-0 RB1 18 ABS (SUTURE) ×6 IMPLANT
TOWEL OR 17X24 6PK STRL BLUE (TOWEL DISPOSABLE) ×3 IMPLANT
TOWEL OR 17X26 10 PK STRL BLUE (TOWEL DISPOSABLE) ×3 IMPLANT
WATER STERILE IRR 1000ML POUR (IV SOLUTION) ×3 IMPLANT

## 2016-05-09 NOTE — Anesthesia Preprocedure Evaluation (Addendum)
Anesthesia Evaluation  Patient identified by MRN, date of birth, ID band Patient awake    Reviewed: Allergy & Precautions, NPO status , Patient's Chart, lab work & pertinent test results  History of Anesthesia Complications (+) PONV  Airway Mallampati: I  TM Distance: >3 FB     Dental   Pulmonary shortness of breath,    Pulmonary exam normal        Cardiovascular hypertension, Normal cardiovascular exam     Neuro/Psych  Headaches,    GI/Hepatic GERD  ,  Endo/Other    Renal/GU      Musculoskeletal  (+) Arthritis ,   Abdominal   Peds  Hematology   Anesthesia Other Findings   Reproductive/Obstetrics                             Anesthesia Physical Anesthesia Plan  ASA: II  Anesthesia Plan: General   Post-op Pain Management:    Induction: Intravenous  Airway Management Planned: Oral ETT  Additional Equipment:   Intra-op Plan:   Post-operative Plan: Extubation in OR  Informed Consent: I have reviewed the patients History and Physical, chart, labs and discussed the procedure including the risks, benefits and alternatives for the proposed anesthesia with the patient or authorized representative who has indicated his/her understanding and acceptance.     Plan Discussed with: CRNA, Anesthesiologist and Surgeon  Anesthesia Plan Comments:         Anesthesia Quick Evaluation

## 2016-05-09 NOTE — H&P (Signed)
CC:  Weakness, difficulty walking  HPI: Benjamin Turner is a 70 year old man I'm seeing for the above. He was initially evaluated about 6 months ago for neck pain and difficulty walking. His MRI demonstrated significant stenosis from C3 to see 6, and at that time we are planning on posterior decompression with lateral mass fusion. At the same time, he was also being treated for cutaneous skin lesions, especially one on his foot, and another on his back. Since our last visit, he has had resection of these lesions, one of which required skin graft from his thigh. He says that these lesions have all been treated and are now healed. He also has dealt with a episode of what he and his wife described as "intestinal infection." Possibly diverticulitis. He says he is currently finishing an antibiotic course for this which is supposed to cleared up. He is done with the antibiotics in a week or 2. As far as his neck symptoms go, he says he continues to have pain in his neck, and continues to have unsteadiness while walking as well as some difficulty with the strength in his hands. All this remains relatively stable compared to our last visit   PMH: Past Medical History:  Diagnosis Date  . Arthritis   . Blood clot associated with vein wall inflammation   . BPH (benign prostatic hypertrophy)   . Cancer ([place])   . Cervical pain (neck)   . GERD (gastroesophageal reflux disease)   . Gout   . Headache   . Heart murmur   . Hemorrhoids   . Hypertension   . Insomnia   . Nocturia associated with benign prostatic hypertrophy   . Numbness and tingling of right arm   . PONV (postoperative nausea and vomiting)    Problems tiliting and turning head  . Shortness of breath dyspnea    with exertion  . Squamous cell skin cancer    right ankle  . Stenosis of cervical spine with myelopathy   . Vertigo     PSH: Past Surgical History:  Procedure Laterality Date  . COLONOSCOPY WITH PROPOFOL N/A 02/19/2016   Procedure:  COLONOSCOPY WITH PROPOFOL;  Surgeon: Manya Silvas, MD;  [facility] ENDOSCOPY;  Service: Endoscopy;  Laterality: N/A;  . ESOPHAGOGASTRODUODENOSCOPY (EGD) WITH PROPOFOL  02/19/2016   Procedure: ESOPHAGOGASTRODUODENOSCOPY (EGD) WITH PROPOFOL;  Surgeon: Manya Silvas, MD;  Location: [facility] ENDOSCOPY;  Service: Endoscopy;;  . LESION REMOVAL Right 10/26/2015   Procedure: EXCISION RIGHT ANKLE SKIN ;  Surgeon: Wallace Going, DO;  Location: [facility];  Service: Plastics;  Laterality: Right;  . SKIN GRAFT Right 11/08/2015   Procedure: IRRIGATION AND DEBRIDEMENT AND SPLIT THICKNESS SKIN GRAFT RIGHT ANKLE ;  Surgeon: Wallace Going, DO;  Location: [facility];  Service: Plastics;  Laterality: Right;    SH: Social History  Substance Use Topics  . Smoking status: Never Smoker  . Smokeless tobacco: Never Used  . Alcohol use No     Comment: rare beer    MEDS: Prior to Admission medications   Medication Sig Start Date End Date Taking? Authorizing Provider  diclofenac (VOLTAREN) 75 MG EC tablet Take 75 mg by mouth 2 (two) times daily.   Yes Historical Provider, MD  feeding supplement, ENSURE ENLIVE, (ENSURE ENLIVE) LIQD Take 237 mLs by mouth 2 (two) times daily between meals. 09/16/15  Yes [name], MD  fluticasone (FLONASE) 50 MCG/ACT nasal spray Place 2 sprays into both nostrils daily as  needed for allergies or rhinitis.    Yes Historical Provider, MD  LINZESS 290 MCG CAPS capsule Take 290 mcg by mouth daily.   Yes Historical Provider, MD  lisinopril (PRINIVIL,ZESTRIL) 20 MG tablet Take 1 tablet (20 mg total) by mouth daily. 01/16/16  Yes Henreitta Leber, MD  omeprazole (PRILOSEC) 40 MG capsule Take 40 mg by mouth daily.   Yes Historical Provider, MD  ondansetron (ZOFRAN ODT) 4 MG disintegrating tablet Take 1 tablet (4 mg total) by mouth every 8 (eight) hours as needed for nausea or vomiting. 12/03/15  Yes Anne-Caroline Mariea Clonts, MD   oxyCODONE-acetaminophen (PERCOCET/ROXICET) 5-325 MG tablet Take 1 tablet by mouth every 8 (eight) hours as needed for severe pain.   Yes Historical Provider, MD  tamsulosin (FLOMAX) 0.4 MG CAPS capsule Take 1 capsule (0.4 mg total) by mouth daily. 01/16/16  Yes Henreitta Leber, MD    ALLERGY: Allergies  Allergen Reactions  . Codeine Nausea And Vomiting    ROS: ROS  NEUROLOGIC EXAM: Awake, alert, oriented Memory and concentration grossly intact Speech fluent, appropriate CN grossly intact Motor exam: Upper Extremities Deltoid Bicep Tricep Grip  Right 5/5 5/5 5/5 4+/5  Left 5/5 5/5 5/5 4+/5   Lower Extremity IP Quad PF DF EHL  Right 5/5 5/5 5/5 5/5 5/5  Left 5/5 5/5 5/5 5/5 5/5   Sensation grossly intact to LT  [facility]: MRI of the cervical spine dated 09/13/15 was again reviewed. This demonstrates maintenance of the normal cervical lordosis. There is broad-based disc bulge with associated ligamentum flavum buckling and resultant moderate to severe stenosis at C3 C4, C4 C5, and C5-C6.  IMPRESSION: 70 year old man with neck pain and symptoms and signs consistent with cervical myelopathy. He does need surgical decompression for this.  PLAN: Proceed with cervical laminectomy and lateral mass fusion from C3 to C6   I did review the MRI findings with the patient and his wife in the office. The indications for surgical decompression and fusion were discussed. Risks of surgery were also discussed including risk of nerve or spinal cord injury with resultant numbness tingling worsening weakness or paralysis, bleeding, infection, and CSF leak. Risk of arterial injury was discussed. The need for additional spine surgeries was also discussed. The patient and his wife understood our discussion, and all questions were answered. They're willing to proceed as above.

## 2016-05-09 NOTE — Anesthesia Postprocedure Evaluation (Signed)
Anesthesia Post Note  Patient: Benjamin GLENDENNING Sr.  Procedure(s) Performed: Procedure(s) (LRB): CERVICAL THREE-FOUR, CERVICAL FOUR-FIVE, CERIVCAL FIVE-SIX LAMINECTOMY WITH LATERAL MASS FUSION (N/A)  Patient location during evaluation: PACU Anesthesia Type: General Level of consciousness: awake and alert Pain management: pain level controlled Vital Signs Assessment: post-procedure vital signs reviewed and stable Respiratory status: spontaneous breathing, nonlabored ventilation, respiratory function stable and patient connected to nasal cannula oxygen Cardiovascular status: blood pressure returned to baseline and stable Postop Assessment: no signs of nausea or vomiting Anesthetic complications: no    Last Vitals:  Vitals:   05/09/16 1720 05/09/16 1734  BP: (!) 152/87 (!) 156/85  Pulse: (!) 102 (!) 102  Resp: 20 (!) 21  Temp:      Last Pain:  Vitals:   05/09/16 1734  TempSrc:   PainSc: Asleep                 Carmeline Kowal S

## 2016-05-09 NOTE — Op Note (Signed)
PREOP DIAGNOSIS:  1. 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 C3-C6 laminectomy for decompression of spinal cord 2. Posterior segmental instrumentation with lateral mass screws, C3-C6 Medtronic 3. Posterolateral arthrodesis, C3-4, C4-5, C5-6 4. Use of non-structural bone allograft - Medtronic Magnifuse  SURGEON: Dr. Consuella Lose, MD  ASSISTANT: Dr. Erline Levine, MD  ANESTHESIA: General Endotracheal  EBL: 600cc  SPECIMENS: None  DRAINS: None  COMPLICATIONS: None immediate  CONDITION: Hemodynamically stable to PACU  HISTORY: Benjamin Turner Sr. is a 70 y.o. male initially presenting to the outpatient clinic several months ago with arm weakness and difficulty walking. MRI demonstrated cervical stenosis from C3-6, and his exam and symptoms were consistent with myelopathy. After he underwent removal and skin grafting of multiple cutaneous lesions, he now presents for cervical decompression and fusion. The risks, benefits, and alternatives to surgery were reviewed in detail. After all questions were answered, consent was obtained.  PROCEDURE IN DETAIL: After informed consent was obtained and witnessed, the patient was brought to the operating room. After induction of general anesthesia, the patient was positioned on the operative table in the prone position and the [place] head holder. All pressure points were meticulously padded. Skin incision was then marked out and prepped and draped in the usual sterile fashion.  After timeout was conducted, the incision was infiltrated with local anesthetic with epinephrine. Incision was made sharply, and Bovie electrocautery was used to dissect through subcutaneous days tissue until the nuchal fascia was identified, and incised in the midline. The occipital and cervical musculature was then divided in the avascular midline plane until the spinous processes of C2, C3, C4, C5, C6, and C7 were identified.  Intraoperative fluoroscopy was used to identify the correct levels. Subperiosteal dissection was then carried out along the lamina of C3-C6 including the lateral mass. Self-retaining retractors were then placed.  At this point, pilot holes were drilled and tapped to 12 mm for lateral mass screws at C3, C4, C5, and C6 bilaterally.  The lamina at C3, C4, C5, and C6 was then removed piecemeal, using a combination of high-speed drill and Kerrison Rogers. I did note significantly thickened and redundant ligamentum flavum at the 3-4, 4-5, and 5-6 interspaces. Hemostasis on the epidural surface was achieved using a combination of bipolar electrocautery and morcellized Gelfoam with thrombin.  Having completed decompression, attention was then turned to decortication in preparation for arthrodesis. High-speed drill was used to decorticate the exposed lateral lamina and lateral mass at C3, C4, C5, and C6 bilaterally. This included the visible portion of the facet complex.  12 mm Medtronic lateral mass screws were then placed bilaterally in C3, C4, C5, and C6. Rod was then placed, set screws were placed, and final tightened.  We then placed bone harvested during the decompression in the lateral gutters, along with 5 cm bags of mag diffuse to complete the arthrodesis.  At this point the wound is irrigated with copious amounts of normal saline irrigation. The wound was then closed in multiple layers using a combination of 0 Vicryl stitch and 3-0 Vicryl stitches. The skin was closed with interrupted subcuticular stitches. A layer of Dermabond was then applied. Sterile dressing was then placed. The patient was then transferred to the stretcher, the [place] head holder was removed. He was then asked abated, and taken to the postanesthesia care unit in stable hemodynamic condition.  At the end of the case all sponge, needle, instrument, and cottonoid counts were correct.

## 2016-05-09 NOTE — Anesthesia Procedure Notes (Signed)
Procedure Name: Intubation Date/Time: 05/09/2016 1:11 PM Performed by: Rejeana Brock L Pre-anesthesia Checklist: Patient identified, Emergency Drugs available, Suction available and Patient being monitored Patient Re-evaluated:Patient Re-evaluated prior to inductionOxygen Delivery Method: Circle System Utilized Preoxygenation: Pre-oxygenation with 100% oxygen Intubation Type: IV induction Ventilation: Mask ventilation without difficulty Laryngoscope Size: Mac and 4 Grade View: Grade I Tube type: Oral Tube size: 7.5 mm Number of attempts: 1 Airway Equipment and Method: Stylet,  Oral airway and LTA kit utilized Placement Confirmation: ETT inserted through vocal cords under direct vision,  positive ETCO2 and breath sounds checked- equal and bilateral (head and neck remained neutral and midline throughout intubation.) Secured at: 22 cm Tube secured with: Tape Dental Injury: Teeth and Oropharynx as per pre-operative assessment

## 2016-05-09 NOTE — Transfer of Care (Signed)
Immediate Anesthesia Transfer of Care Note  Patient: Benjamin WAYLAND Sr.  Procedure(s) Performed: Procedure(s) with comments: CERVICAL THREE-FOUR, CERVICAL FOUR-FIVE, CERIVCAL FIVE-SIX LAMINECTOMY WITH LATERAL MASS FUSION (N/A) - LAMINECTOMY, LATERAL MASS FUSION C3-C6  Patient Location: PACU  Anesthesia Type:General  Level of Consciousness: awake, alert  and oriented  Airway & Oxygen Therapy: Patient Spontanous Breathing and Patient connected to nasal cannula oxygen  Post-op Assessment: Report given to RN and Post -op Vital signs reviewed and stable  Post vital signs: Reviewed and stable  Last Vitals:  Vitals:   05/09/16 0952 05/09/16 1650  BP: (!) 143/78 (!) 144/82  Pulse: (!) 101 (!) 104  Resp: 18 (!) 24  Temp: 36.7 C 36.6 C    Last Pain:  Vitals:   05/09/16 1650  TempSrc:   PainSc: Asleep      Patients Stated Pain Goal: 3 ([id number])  Complications: No apparent anesthesia complications

## 2016-05-10 LAB — CBC
HCT: 33.7 % — ABNORMAL LOW (ref 39.0–52.0)
Hemoglobin: 11.1 g/dL — ABNORMAL LOW (ref 13.0–17.0)
MCH: 29 pg (ref 26.0–34.0)
MCHC: 32.9 g/dL (ref 30.0–36.0)
MCV: 88 fL (ref 78.0–100.0)
PLATELETS: 208 10*3/uL (ref 150–400)
RBC: 3.83 MIL/uL — AB (ref 4.22–5.81)
RDW: 14.5 % (ref 11.5–15.5)
WBC: 13.5 10*3/uL — ABNORMAL HIGH (ref 4.0–10.5)

## 2016-05-10 LAB — BASIC METABOLIC PANEL
Anion gap: 7 (ref 5–15)
BUN: 12 mg/dL (ref 6–20)
CALCIUM: 10.1 mg/dL (ref 8.9–10.3)
CO2: 28 mmol/L (ref 22–32)
CREATININE: 0.65 mg/dL (ref 0.61–1.24)
Chloride: 96 mmol/L — ABNORMAL LOW (ref 101–111)
GFR calc Af Amer: >60 mL/min (ref >60)
GLUCOSE: 122 mg/dL — AB (ref 65–99)
Potassium: 4.1 mmol/L (ref 3.5–5.1)
SODIUM: 131 mmol/L — AB (ref 135–145)

## 2016-05-10 MED ORDER — ENSURE ENLIVE PO LIQD
237.0000 mL | Freq: Three times a day (TID) | ORAL | Status: DC
  Administered 2016-05-10 – 2016-05-14 (×10): 237 mL via ORAL
  Filled 2016-05-10 (×19): qty 237

## 2016-05-10 MED ORDER — PANTOPRAZOLE SODIUM 40 MG PO TBEC
40.0000 mg | DELAYED_RELEASE_TABLET | Freq: Every day | ORAL | Status: DC
  Administered 2016-05-11 – 2016-05-12 (×2): 40 mg via ORAL
  Filled 2016-05-10 (×2): qty 1

## 2016-05-10 NOTE — Progress Notes (Signed)
No issues overnight. Pt c/o nausea this am due to oxycodone, says he needs the protonix in the am. No change in UE or LE symptoms.  EXAM:  BP 134/82 (BP Location: Left Arm)   Pulse (!) 101   Temp 97.9 F (36.6 C) (Oral)   Resp 20   Ht 5\' 8"  (1.727 m)   Wt 79.8 kg (176 lb)   SpO2 96%   BMI 26.76 kg/m   Awake, alert, oriented  Speech fluent, appropriate  CN grossly intact  5/5 BUE/BLE x minimal grip weakness, at baseline  IMPRESSION:  70 y.o. male POD#1 s/p cervical decompression/lat mass fusion. Doing well  PLAN: - Will get pediatric Aspen collar from [place] to 8a daily - Add ensure TID AC - Cont to mobilize

## 2016-05-10 NOTE — Progress Notes (Signed)
Pt and wife told tech that pt felt his neck pop. rn assessed site. No draining noted on honeycomb, some slight swelling at incision site. Collar realigned and reapplied.

## 2016-05-10 NOTE — Care Management Note (Addendum)
Case Management Note  Patient Details  Name: Benjamin PITONIAK Sr. MRN: [id number] Date of Birth: Nov 16, 1945  Subjective/Objective: Pt presented for C3-C6 laminectomy for decompression of spinal cord. Pt is from home with wife.                    Action/Plan: CM did receive referral for HH Needs: Family has used [facility] in the past and would like to utilize again for [name]. CM did make referral for [facility] to [place] [facility] and [facility] to begin within 24-48 hours post d/c. Per wife they will not be able to accommodate a RW due to the home is small. Pt will need actual order and F2F completed once stable for d/c. CM to continue to monitor for additional needs.   Expected Discharge Date:  05/11/16               Expected Discharge Plan:  [place]  In-House Referral:  NA  Discharge planning Services  CM Consult  Post Acute Care Choice:  Home Health Choice offered to:  Patient, Spouse  DME Arranged:    DME Agency:     HH Arranged:  PT [place]:  [place]  Status of Service:  In process, will continue to follow  If discussed at Long Length of Stay Meetings, dates discussed:    Additional Comments:  Bethena Roys, RN 05/10/2016, 4:09 PM

## 2016-05-10 NOTE — Clinical Social Work Note (Deleted)
CSW consulted for New SNF. CSW discussed pt with RN, Agricultural consultant, and RNCM. PT is recommending HHPT. CSW is signing off as no further needs identified.   Darden Dates, MSW, LCSW  Clinical Social Worker  [phone]

## 2016-05-10 NOTE — Evaluation (Signed)
Physical Therapy Evaluation Patient Details Name: Benjamin Turner. MRN: [id number] DOB: 10/04/1945 Today's Date: 05/10/2016   History of Present Illness  Patient is a 70 y/o male with hx of vertigo, skin ca, HTN, gout and BPH present s/p C3-4, 4-5, 5-6 laminectomy with lateral mass fusion.  Clinical Impression  Patient presents with pain, nausea and impaired sensation/strength BUEs/LEs s/p above surgery. Mobility assessment limited due to nausea and feeling sick on his stomach. Requires Min A for standing balance and to take a few steps in room. Education re: cervical precautions, log roll, collar. Collar is ill-fitting and pt may benefit from youth sized collar due to severe kyphosis and inability to extend cervical spine for proper positioning. Would benefit from use of RW for support however pt reluctant. Pt plans to discharge home with support from wife. Will follow acutely to maximize independence and mobility prior to return home.    Follow Up Recommendations Home health PT;Supervision for mobility/OOB;Supervision/Assistance - 24 hour    Equipment Recommendations  Other (comment) (TBA)    Recommendations for Other Services OT consult     Precautions / Restrictions Precautions Precautions: Fall;Cervical Required Braces or Orthoses: Cervical Brace Cervical Brace: Hard collar Restrictions Weight Bearing Restrictions: No Other Position/Activity Restrictions: C-collar does not fit right, suggesting a youth sized collar. RN notified.      Mobility  Bed Mobility Overal bed mobility: Needs Assistance Bed Mobility: Rolling;Sidelying to Sit;Sit to Sidelying Rolling: Min guard Sidelying to sit: Min guard     Sit to sidelying: Min guard General bed mobility comments: Cues for log roll technique. Heavy use of rail. Increased time and difficulty.   Transfers Overall transfer level: Needs assistance Equipment used: None Transfers: Sit to/from Stand Sit to Stand: Min guard          General transfer comment: Min guard to steady in standing. Stood from [employer], from chair x1.   Ambulation/Gait Ambulation/Gait assistance: Min assist Ambulation Distance (Feet): 5 Feet Assistive device: None Gait Pattern/deviations: Trunk flexed;Shuffle;Decreased step length - right;Decreased step length - left Gait velocity: decreased   General Gait Details: Able to take a few steps to/from chair with min A for balance. Shuffling gait pattern noted.   Stairs            Wheelchair Mobility    Modified Rankin (Stroke Patients Only)       Balance Overall balance assessment: Needs assistance Sitting-balance support: Feet supported;No upper extremity supported Sitting balance-Leahy Scale: Fair     Standing balance support: During functional activity Standing balance-Leahy Scale: Fair                               Pertinent Vitals/Pain Pain Assessment: Faces Faces Pain Scale: Hurts little more Pain Location: neck Pain Descriptors / Indicators: Sore;Operative site guarding Pain Intervention(s): Limited activity within patient's tolerance;Monitored during session;Repositioned;Premedicated before session    [place] expects to be discharged to:: Private residence Living Arrangements: Spouse/significant other Available Help at Discharge: Family;Available 24 hours/day Type of Home: Mobile home Home Access: Stairs to enter Entrance Stairs-Rails: Right;Left;Can reach both Entrance Stairs-Number of Steps: 4 Home Layout: One level Home Equipment: Cane - single point      Prior Function Level of Independence: Needs assistance   Gait / Transfers Assistance Needed: Reports difficulties with walking PTA; using furniture for support.   ADL's / Homemaking Assistance Needed: [place] at sink level. Assist for IADLs.  Hand Dominance   Dominant Hand: Right    Extremity/Trunk Assessment   Upper Extremity Assessment: Defer to OT  evaluation;Generalized weakness (numbness in BUEs.)           Lower Extremity Assessment: Generalized weakness;RLE deficits/detail;LLE deficits/detail RLE Deficits / Details: Grossly ~3/5 throughout  LLE Deficits / Details: Grossly ~3/5 throughout   Cervical / Trunk Assessment: Kyphotic;Other exceptions  Communication   Communication: No difficulties  Cognition Arousal/Alertness: Lethargic;Suspect due to medications Behavior During Therapy: [facility] for tasks assessed/performed Overall Cognitive Status: Within Functional Limits for tasks assessed                      General Comments General comments (skin integrity, edema, etc.): Spouse present in room during session. Tried to adjust collar however not able to as pt reporting it was choking him. May need youth collar.    Exercises     Assessment/Plan    PT Assessment Patient needs continued PT services  PT Problem List Decreased strength;Decreased mobility;Decreased knowledge of precautions;Decreased activity tolerance;Impaired sensation;Pain;Decreased balance;Decreased knowledge of use of DME       PT Diagnosis   Muscle weakness (generalized)  Surgery, elective - Plan: DG Cervical Spine 1 View, DG Cervical Spine 1 View  Cervical pain    PT Treatment Interventions Therapeutic activities;DME instruction;Gait training;Therapeutic exercise;Patient/family education;Balance training;Functional mobility training;Stair training    PT Goals (Current goals can be found in the Care Plan section)  Acute Rehab PT Goals Patient Stated Goal: none stated PT Goal Formulation: With patient Time For Goal Achievement: 05/24/16 Potential to Achieve Goals: Good    Frequency Min 5X/week   Barriers to discharge Inaccessible home environment stairs to enter home    Co-evaluation               End of Session Equipment Utilized During Treatment: Cervical collar;Gait belt Activity Tolerance: Patient limited by pain  (nausea) Patient left: in bed;with call bell/phone within reach;with bed alarm set;with family/visitor present;with SCD's reapplied Nurse Communication: Mobility status         Time: [id number] PT Time Calculation (min) (ACUTE ONLY): 21 min   Charges:   PT Evaluation $PT Eval Moderate Complexity: 1 Procedure     PT G Codes:        Vanna Shavers A Jadie Allington 05/10/2016, 11:05 AM  Wray Kearns, PT, DPT [phone]

## 2016-05-10 NOTE — Clinical Social Work Note (Signed)
CSW consulted for New SNF. PT is recommending HHPT. RNCM is aware. CSW is signing off as no further needs identified.   Darden Dates, MSW, LCSW  Clinical Social Worker [phone]

## 2016-05-11 ENCOUNTER — Inpatient Hospital Stay (HOSPITAL_COMMUNITY): Payer: Medicare HMO

## 2016-05-11 NOTE — Progress Notes (Signed)
Patient ID: Benjamin MELLIS Sr., male   DOB: May 05, 1946, 70 y.o.   MRN: [id number] Subjective: Patient reports increased neck and arm pain after a "pop" in the neck yesterday, some arm pain, difficulty standing because of pain  Objective: Vital signs in last 24 hours: Temp:  [97.6 F (36.4 C)-98.7 F (37.1 C)] 97.6 F (36.4 C) (09/16 0528) Pulse Rate:  [74-107] 104 (09/16 0528) Resp:  [18-20] 20 (09/16 0528) BP: (114-135)/(50-82) 114/50 (09/16 0528) SpO2:  [92 %-96 %] 93 % (09/16 0528)  Intake/Output from previous day: 09/15 0701 - 09/16 0700 In: 843 [P.O.:840; I.V.:3] Out: 75 [Urine:75] Intake/Output this shift: Total I/O In: 3 [I.V.:3] Out: -   Neurologic: Grossly normal to in bed exam, grips 4+/5  Lab Results: Lab Results  Component Value Date   WBC 13.5 (H) 05/10/2016   HGB 11.1 (L) 05/10/2016   HCT 33.7 (L) 05/10/2016   MCV 88.0 05/10/2016   PLT 208 05/10/2016   Lab Results  Component Value Date   INR 1.13 09/15/2015   BMET Lab Results  Component Value Date   NA 131 (L) 05/10/2016   K 4.1 05/10/2016   CL 96 (L) 05/10/2016   CO2 28 05/10/2016   GLUCOSE 122 (H) 05/10/2016   BUN 12 05/10/2016   CREATININE 0.65 05/10/2016   CALCIUM 10.1 05/10/2016    Studies/Results: Dg Cervical Spine 1 View  Result Date: 05/09/2016 CLINICAL DATA:  C3-C6 lateral mass fusion and laminectomy EXAM: DG C-ARM 61-120 MIN; DG CERVICAL SPINE - 1 VIEW COMPARISON:  MR cervical spine 09/13/2015, cervical spine radiographs 09/07/2015 FLUOROSCOPY TIME:  0 minutes 8 seconds Images obtained: 1 FINDINGS: BILATERAL pedicle screws and posterior bars are identified at the mid to inferior cervical region. However, due to light technique, unable to establish bony landmarks or adequately visualize osseous structures. IMPRESSION: Nondiagnostic exam. Electronically Signed   By: Lavonia Dana M.D.   On: 05/09/2016 17:18   Dg C-arm 1-60 Min  Result Date: 05/09/2016 CLINICAL DATA:  C3-C6 lateral mass  fusion and laminectomy EXAM: DG C-ARM 61-120 MIN; DG CERVICAL SPINE - 1 VIEW COMPARISON:  MR cervical spine 09/13/2015, cervical spine radiographs 09/07/2015 FLUOROSCOPY TIME:  0 minutes 8 seconds Images obtained: 1 FINDINGS: BILATERAL pedicle screws and posterior bars are identified at the mid to inferior cervical region. However, due to light technique, unable to establish bony landmarks or adequately visualize osseous structures. IMPRESSION: Nondiagnostic exam. Electronically Signed   By: Lavonia Dana M.D.   On: 05/09/2016 17:18    Assessment/Plan: Check some xrays, mobilize, pain control   LOS: 2 days    Buck Mcaffee S 05/11/2016, 6:46 AM

## 2016-05-11 NOTE — Progress Notes (Signed)
Pt back from MRI 

## 2016-05-11 NOTE — Progress Notes (Signed)
Pt down to MRI

## 2016-05-11 NOTE — Progress Notes (Signed)
Xrays reviewed.  Hardware intact and in good position.

## 2016-05-11 NOTE — Progress Notes (Signed)
Tech thomas assisted pt to get back into bed. Staff were ready to use lift to get pt back into bed. Pt stated he felt better and was about to stand and +1 assist to bed.   rn had specifically asked pt and wife if pt had ever experienced this type of numbness before surgery. Wife stated no.  Pt had stated prio that he did have some right hand numbness that was chronic.   After pain medication, now pain 5/10.

## 2016-05-11 NOTE — Progress Notes (Signed)
Physical Therapy Treatment Patient Details Name: Benjamin Turner Sr. MRN: [id number] DOB: 1946/07/16 Today's Date: 05/11/2016    History of Present Illness Patient is a 70 y/o male with hx of vertigo, skin ca, HTN, gout and BPH present s/p C3-4, 4-5, 5-6 laminectomy with lateral mass fusion.    PT Comments    Patient continues to be limited by pain in cervical spine, back and BUEs. Not able to find a comfortable position. Part of the reason may be his collar as it does not fit. Tolerated gait training with Min A for balance/safety using RW. Encouraged OOB to chair as much as tolerated. Will continue to follow to maximize independence and mobility and prepare pt for discharge home.  Follow Up Recommendations  Home health PT;Supervision for mobility/OOB;Supervision/Assistance - 24 hour     Equipment Recommendations  Rolling walker with 5" wheels    Recommendations for Other Services       Precautions / Restrictions Precautions Precautions: Fall;Cervical Required Braces or Orthoses: Cervical Brace Cervical Brace: Hard collar Restrictions Weight Bearing Restrictions: No Other Position/Activity Restrictions: C-collar does not fit right, suggesting a youth sized collar. RN notified.    Mobility  Bed Mobility Overal bed mobility: Needs Assistance Bed Mobility: Rolling;Sidelying to Sit Rolling: Min guard Sidelying to sit: Mod assist;HOB elevated       General bed mobility comments: Difficulty doing log roll technique. Needs assist to elevate trunk. Lots of pain with mobility.   Transfers Overall transfer level: Needs assistance Equipment used: Rolling walker (2 wheeled) Transfers: Sit to/from Stand Sit to Stand: Min assist         General transfer comment: Min A to power to standing with cues for hand placement. Transferred to chair post ambulation.  Ambulation/Gait Ambulation/Gait assistance: Min assist Ambulation Distance (Feet): 75 Feet Assistive device: Rolling  walker (2 wheeled) Gait Pattern/deviations: Step-through pattern;Decreased stride length;Shuffle Gait velocity: decreased   General Gait Details: Slow, shuffling gait with use of RW. No LOB. Pain limiting mobility.   Stairs            Wheelchair Mobility    Modified Rankin (Stroke Patients Only)       Balance Overall balance assessment: Needs assistance Sitting-balance support: Feet supported;No upper extremity supported Sitting balance-Leahy Scale: Fair     Standing balance support: During functional activity Standing balance-Leahy Scale: Poor Standing balance comment: Reliant on BUEs for support.                    Cognition Arousal/Alertness: Awake/alert Behavior During Therapy: WFL for tasks assessed/performed Overall Cognitive Status: Within Functional Limits for tasks assessed                      Exercises      General Comments General comments (skin integrity, edema, etc.): Spouse present in room. Attempted multiple times to adjust collar but pt does not have cervical ROM for proper fit.      Pertinent Vitals/Pain Pain Assessment: Faces Faces Pain Scale: Hurts whole lot Pain Location: neck, back and BUEs, Rt>Left Pain Descriptors / Indicators: Sore;Aching;Numbness Pain Intervention(s): Monitored during session;Repositioned;Patient requesting pain meds-RN notified;Limited activity within patient's tolerance    Home Living                      Prior Function            PT Goals (current goals can now be found in the care plan section) Progress towards  PT goals: Progressing toward goals    Frequency    Min 5X/week      PT Plan Current plan remains appropriate    Co-evaluation             End of Session Equipment Utilized During Treatment: Cervical collar;Gait belt Activity Tolerance: Patient limited by pain Patient left: in chair;with call bell/phone within reach;with family/visitor present     Time:  1042-1100 PT Time Calculation (min) (ACUTE ONLY): 18 min  Charges:  $Gait Training: 8-22 mins                    G Codes:      Signe Tackitt A Cranford Blessinger 05/11/2016, 11:08 AM Wray Kearns, [place], DPT [phone]

## 2016-05-11 NOTE — Progress Notes (Signed)
Pt and wife report yesterday they were told pt could take the collar off while pt eats. Wife has been unattaching collar and reattaching collar when pt takes medications and eats.

## 2016-05-11 NOTE — Evaluation (Signed)
Occupational Therapy Evaluation Patient Details Name: Benjamin MATTESON Sr. MRN: [id number] DOB: [birth year] Today's Date: 05/11/2016    History of Present Illness Patient is a 70 y/o male with hx of vertigo, skin ca, HTN, gout and BPH present s/p C3-4, 4-5, 5-6 laminectomy with lateral mass fusion.   Clinical Impression   Evaluation limited due to pt c/o of 7/10 pain and his inability to tolerate sitting EOB for more than 5 minutes. Pt required mod assist for bed mobility and had poor return demonstration of log roll technique despite max verbal cues. Formed new C-collar brace using youth-sized front piece and adult-sized back piece for a better-fit. Pt plans to d/c home with 24/7 assistance from his wife, however due to pt's weakness and pain his wife is unsure if she able to provide necessary level of physical assistance. Currently recommending SNF for post-acute rehab unless pt makes more progress with therapy and is able to transfer and ambulate with less assistance. Will continue to follow acutely.    Follow Up Recommendations  SNF;Supervision/Assistance - 24 hour    Equipment Recommendations  Other (comment) (TBD)    Recommendations for Other Services       Precautions / Restrictions Precautions Precautions: Fall;Cervical Required Braces or Orthoses: Cervical Brace Cervical Brace: Hard collar;At all times Restrictions Weight Bearing Restrictions: No Other Position/Activity Restrictions: Used youth-sized front and adult-sized back to form a better fitting C-collar.      Mobility Bed Mobility Overal bed mobility: Needs Assistance Bed Mobility: Rolling;Sidelying to Sit;Sit to Supine Rolling: Min assist Sidelying to sit: Mod assist   Sit to supine: Mod assist   General bed mobility comments: Pt with poor return demonstration of log roll technique despite max cues. Mod assist from therapist and wife. Pt unable to tolerate sitting EOB more than 5 minutes due to back and neck  pain.  Transfers                 General transfer comment: Pt declined to mobilize further than sitting EOB due to pain.    Balance Overall balance assessment: Needs assistance Sitting-balance support: No upper extremity supported;Feet supported Sitting balance-Leahy Scale: Poor Sitting balance - Comments: psoteriro lean, min assist to maintain safe upright position Postural control: Posterior lean                                  ADL Overall ADL's : Needs assistance/impaired Eating/Feeding: Set up;Sitting   Grooming: Wash/dry hands;Wash/dry face;Set up;Bed level   Upper Body Bathing: Moderate assistance;Sitting   Lower Body Bathing: Maximal assistance;Sit to/from stand   Upper Body Dressing : Moderate assistance;Sitting   Lower Body Dressing: Maximal assistance;Sit to/from stand                 General ADL Comments: Pt refused to mobilize further than sitting EOB     Vision Vision Assessment?: No apparent visual deficits   Perception     Praxis      Pertinent Vitals/Pain Pain Assessment: 0-10 Pain Score: 7  Pain Location: neck, back, BUEs R>L Pain Descriptors / Indicators: Aching;Sore Pain Intervention(s): Limited activity within patient's tolerance;Monitored during session;Repositioned;RN gave pain meds during session     Hand Dominance Right   Extremity/Trunk Assessment Upper Extremity Assessment Upper Extremity Assessment: Generalized weakness (numbness in BUEs, light touch intact)   Lower Extremity Assessment Lower Extremity Assessment: Defer to PT evaluation   Cervical / Trunk Assessment Cervical /  Trunk Assessment: Kyphotic;Other exceptions Cervical / Trunk Exceptions: severely kyphotic, severe neck flexion, rounded shoulders and forward head   Communication Communication Communication: No difficulties   Cognition Arousal/Alertness: Awake/alert Behavior During Therapy: Flat affect Overall Cognitive Status: Within  Functional Limits for tasks assessed                     General Comments       Exercises       Shoulder Instructions      Home Living Family/patient expects to be discharged to:: Private residence Living Arrangements: Spouse/significant other Available Help at Discharge: Family;Available 24 hours/day Type of Home: Mobile home Home Access: Stairs to enter Entrance Stairs-Number of Steps: 4 Entrance Stairs-Rails: Right;Left;Can reach both Home Layout: One level     Bathroom Shower/Tub: Tub/shower unit Shower/tub characteristics: Architectural technologist: Standard Bathroom Accessibility: No   Home Equipment: Cane - single point   Additional Comments: Pt's wife says there is not enough room in their bathroom to fit a 3in1 over the toilet, but she could set it up in front of the sink so he can sponge bathe      Prior Functioning/Environment Level of Independence: Needs assistance  Gait / Transfers Assistance Needed: Reports difficulties with walking PTA; using furniture for support.  ADL's / Homemaking Assistance Needed: Bathes at sink level. Assist for IADLs.            OT Problem List: Decreased strength;Decreased range of motion;Decreased activity tolerance;Impaired balance (sitting and/or standing);Decreased coordination;Decreased safety awareness;Decreased knowledge of use of DME or AE;Decreased knowledge of precautions;Pain;Impaired UE functional use;Impaired sensation   OT Treatment/Interventions: Self-care/ADL training;Therapeutic exercise;DME and/or AE instruction;Therapeutic activities;Patient/family education;Balance training    OT Goals(Current goals can be found in the care plan section) Acute Rehab OT Goals Patient Stated Goal: none stated OT Goal Formulation: With patient Time For Goal Achievement: 05/25/16 Potential to Achieve Goals: Good ADL Goals Pt Will Perform Grooming: with supervision;standing Pt Will Perform Upper Body Bathing: with  supervision;sitting Pt Will Perform Lower Body Bathing: with supervision;sit to/from stand Pt Will Transfer to Toilet: with supervision;ambulating;regular height toilet Pt Will Perform Toileting - Clothing Manipulation and hygiene: with supervision;sit to/from stand Additional ADL Goal #1: Pt/caregiver will independently don/doff cervical collar.  OT Frequency: Min 2X/week   Barriers to D/C: Decreased caregiver support  Unsure if pt's wife can provide necessary level of physical assistance       Co-evaluation              End of Session Equipment Utilized During Treatment: Cervical collar Nurse Communication: Mobility status  Activity Tolerance: Patient limited by pain Patient left: in bed;with call bell/phone within reach;with bed alarm set;with family/visitor present;with SCD's reapplied   Time: [id number] OT Time Calculation (min): 21 min Charges:  OT General Charges $OT Visit: 1 Procedure OT Evaluation $OT Eval Moderate Complexity: 1 Procedure G-Codes:    Redmond Baseman, OTR/L [phone] 05/11/2016, 4:20 PM

## 2016-05-11 NOTE — Progress Notes (Signed)
Child collar arrived, pt has back of adult collar and child front collar applied together.   So use front of child and back of adult.

## 2016-05-11 NOTE — Progress Notes (Addendum)
Pt got into chair with PT, was in chair estimated 20 minutes. Pt asking to get back into bed. rn explained that she wanted pt to try to stay sitting up until lunch came, or at least an hour.   Wife thought pt had been in chair for 30 minutes and that pt said he was in pain and feeling numb.  rn again tried to encourage pt to sit in chair for 1 hour.   5 minutes later wife came out and said pt said he was "numb" and would fall out of chair soon.  rn went into room to assist pt to get back into bed. Pt not using his arms to push himself up. Tech came into room as well. Tech on pts right side, rn on pts left side, both staff tried to help pt stand, pt would not put weight on his feet and would not push himself up to standing position. rn explained to pt that for pt to get back into bed he would have to use some of his own physical strength.   Pt said he could not. So tech and rn reposition pt to laying flat in chair.  rn asked pt what his pain level was 1-10, pt reported 25.   Pt given pain medication. Pt was slightly diaphoretic. Vital signs checked and stable. Pt alert and oriented x4. Pt reported his arms, shoulders and back were "numb". Legs were not numb.   Will alert md.   At 69 spoke with md, pt will get stat cervical spine MRI. Will call md with results.

## 2016-05-11 NOTE — Progress Notes (Signed)
Pt had small bowel movement in bed. Wife assisted pt to [employer]. rn changed sheets. Pt only on BSC less than 5 minutes and already wanted to get back in bed. Wife encouraging pt to stay on [facility] and have bowel movement.   Pt grandson is going to bring diapers/ briefs. Wife stated that these are not so the pt can just sit in bed and poop. Wife very motivated to have pt get up, but pt not motivated.

## 2016-05-11 NOTE — Progress Notes (Signed)
md made aware of MRI results. No new orders

## 2016-05-12 MED ORDER — PANTOPRAZOLE SODIUM 40 MG PO TBEC
40.0000 mg | DELAYED_RELEASE_TABLET | Freq: Two times a day (BID) | ORAL | Status: DC
  Administered 2016-05-12 – 2016-05-15 (×7): 40 mg via ORAL
  Filled 2016-05-12 (×7): qty 1

## 2016-05-12 MED ORDER — LOPERAMIDE HCL 2 MG PO CAPS
2.0000 mg | ORAL_CAPSULE | Freq: Once | ORAL | Status: AC
  Administered 2016-05-12: 2 mg via ORAL
  Filled 2016-05-12: qty 1

## 2016-05-12 NOTE — Progress Notes (Signed)
Complained of numbness and weakness after therapy yesterday MRI checked and no evidence of epidural hematoma or other compression Moving arms and legs well Sensation of numbness is probably cord hyperemia due to reperfusion Explained he cannot continue IV pain medication because he will likely go to a SNF tomorrow Continue therapy

## 2016-05-12 NOTE — Progress Notes (Signed)
Pt refused am medication Linzess per wife " he is not taking that today". Monitoring ongoing.

## 2016-05-12 NOTE — Progress Notes (Signed)
Occupational Therapy Treatment Patient Details Name: Benjamin CASPER Sr. MRN: [id number] DOB: [birth year] Today's Date: 05/12/2016    History of present illness Patient is a 70 y/o male with hx of vertigo, skin ca, HTN, gout and BPH present s/p C3-4, 4-5, 5-6 laminectomy with lateral mass fusion.   OT comments  Pt agreeable to participate in therapy session after max encouragement provided. Pt required min assist for basic transfers and +2 assist for safety due to pt's significant weakness and decreased activity tolerance. Reviewed cervical precautions and encouraged pt to sit up in the chair for at least 30 minutes before returning to bed. Current POC and discharge disposition remain appropriate. Will continue to follow acutely.   Follow Up Recommendations  SNF;Supervision/Assistance - 24 hour    Equipment Recommendations  Other (comment) (TBD at next venue of care)    Recommendations for Other Services      Precautions / Restrictions Precautions Precautions: Cervical;Fall Required Braces or Orthoses: Cervical Brace Cervical Brace: Hard collar;At all times Restrictions Weight Bearing Restrictions: No       Mobility Bed Mobility Overal bed mobility: Needs Assistance Bed Mobility: Rolling;Sidelying to Sit;Supine to Sit Rolling: Min guard Sidelying to sit: Min assist;+2 for physical assistance Supine to sit: Min assist;+2 for physical assistance     General bed mobility comments: Pt attempted log roll technique without success d/t pain. Min +2 assist for trunk support to come to sitting position. Pt stated "I can't use my arms to push up, they're numb."  Transfers Overall transfer level: Needs assistance Equipment used: Rolling walker (2 wheeled) Transfers: Sit to/from Stand Sit to Stand: Min assist;+2 safety/equipment         General transfer comment: Assist for boost to stand and to maintain safe standing balance. Pt with spontaneous knee buckling at times while  ambulating down the hallway, but did not require physical assistance to regain balance. VCs for safe hand placement and proper use of RW.    Balance Overall balance assessment: Needs assistance Sitting-balance support: No upper extremity supported;Feet supported Sitting balance-Leahy Scale: Fair     Standing balance support: Bilateral upper extremity supported;During functional activity Standing balance-Leahy Scale: Poor Standing balance comment: Reliant on BUE support to maintain balance                   ADL Overall ADL's : Needs assistance/impaired Eating/Feeding: Set up;Sitting               Upper Body Dressing : Minimal assistance;Sitting   Lower Body Dressing: Moderate assistance;+2 for safety/equipment;Sit to/from stand   Toilet Transfer: Minimal assistance;+2 for safety/equipment   Toileting- Clothing Manipulation and Hygiene: Moderate assistance;+2 for safety/equipment;Sit to/from stand         General ADL Comments: Pt required max encouragement and +2 assist for safety.      Vision                     Perception     Praxis      Cognition   Behavior During Therapy: Flat affect Overall Cognitive Status: Within Functional Limits for tasks assessed                       Extremity/Trunk Assessment               Exercises     Shoulder Instructions       General Comments      Pertinent Vitals/ Pain  Pain Assessment: 0-10 Pain Score: 7  Faces Pain Scale: Hurts whole lot Pain Location: neck and back Pain Descriptors / Indicators: Aching;Sore;Shooting Pain Intervention(s): Limited activity within patient's tolerance;Monitored during session;Repositioned  Home Living                                          Prior Functioning/Environment              Frequency  Min 2X/week        Progress Toward Goals  OT Goals(current goals can now be found in the care plan section)  Progress  towards OT goals: Progressing toward goals  Acute Rehab OT Goals Patient Stated Goal: none stated OT Goal Formulation: With patient Time For Goal Achievement: 05/25/16 Potential to Achieve Goals: Good ADL Goals Pt Will Perform Grooming: with supervision;standing Pt Will Perform Upper Body Bathing: with supervision;sitting Pt Will Perform Lower Body Bathing: with supervision;sit to/from stand Pt Will Transfer to Toilet: with supervision;ambulating;regular height toilet Pt Will Perform Toileting - Clothing Manipulation and hygiene: with supervision;sit to/from stand Additional ADL Goal #1: Pt/caregiver will independently don/doff cervical collar.  Plan Discharge plan remains appropriate    Co-evaluation                 End of Session Equipment Utilized During Treatment: Cervical collar;Rolling walker;Gait belt   Activity Tolerance Patient limited by pain   Patient Left in chair;with call bell/phone within reach;with chair alarm set;with family/visitor present   Nurse Communication Mobility status        Time: [id number] OT Time Calculation (min): 23 min  Charges: OT General Charges $OT Visit: 1 Procedure OT Treatments $Self Care/Home Management : 8-22 mins  Redmond Baseman, OTR/L Pager: [phone] 05/12/2016, 1:37 PM

## 2016-05-12 NOTE — Progress Notes (Signed)
Physical Therapy Treatment Patient Details Name: Benjamin Turner Sr. MRN: [id number] DOB: 1946/08/03 Today's Date: 05/12/2016    History of Present Illness Patient is a 70 y/o male with hx of vertigo, skin ca, HTN, gout and BPH present s/p C3-4, 4-5, 5-6 laminectomy with lateral mass fusion.    PT Comments    Patient is making gradual progress toward mobility goals. +2 for safety for OOB mobility due to pain, decreased activity tolerance, and weakness. Wife present for session and feels that she will be unable to provide pt with physical assist required. Recommending [facility] for further skilled PT services to maximize independence and safety with mobility.   Follow Up Recommendations  SNF;Supervision/Assistance - 24 hour     Equipment Recommendations  Other (comment) (TBD)    Recommendations for Other Services OT consult     Precautions / Restrictions Precautions Precautions: Cervical;Fall Required Braces or Orthoses: Cervical Brace Cervical Brace: Hard collar;At all times Restrictions Weight Bearing Restrictions: No    Mobility  Bed Mobility Overal bed mobility: Needs Assistance Bed Mobility: Rolling;Sidelying to Sit;Supine to Sit Rolling: Min guard Sidelying to sit: Min assist;+2 for physical assistance Supine to sit: Min assist;+2 for physical assistance     General bed mobility comments: cues for sequencing/technique; unable to complete log roll technique due to BUE/neck pain; assis to elevate trunk into sitting  Transfers Overall transfer level: Needs assistance Equipment used: Rolling walker (2 wheeled) Transfers: Sit to/from Stand Sit to Stand: Min assist;+2 safety/equipment         General transfer comment: cues for safe hand placement/technique; assist to stand and gain balance upon standing  Ambulation/Gait Ambulation/Gait assistance: Min assist;+2 safety/equipment Ambulation Distance (Feet): 90 Feet Assistive device: Rolling walker (2 wheeled) Gait  Pattern/deviations: Shuffle;Trunk flexed Gait velocity: decreased   General Gait Details: slow, shuffling gait; cues for posture and safe use of AD with assist to steady and manage RW at times; some bilat knee instability noted but no LOB   Stairs            Wheelchair Mobility    Modified Rankin (Stroke Patients Only)       Balance Overall balance assessment: Needs assistance Sitting-balance support: No upper extremity supported;Feet supported Sitting balance-Leahy Scale: Fair     Standing balance support: Bilateral upper extremity supported;During functional activity Standing balance-Leahy Scale: Poor Standing balance comment: Reliant on BUE support to maintain balance                    Cognition Arousal/Alertness: Awake/alert Behavior During Therapy: Flat affect Overall Cognitive Status: Within Functional Limits for tasks assessed                      Exercises      General Comments        Pertinent Vitals/Pain Pain Assessment: 0-10 Pain Score: 7  Faces Pain Scale: Hurts whole lot Pain Location: neck,back Pain Descriptors / Indicators: Aching;Shooting;Sore Pain Intervention(s): Limited activity within patient's tolerance;Monitored during session;Repositioned    Home Living                      Prior Function            PT Goals (current goals can now be found in the care plan section) Acute Rehab PT Goals Patient Stated Goal: none stated Progress towards PT goals: Progressing toward goals    Frequency    Min 5X/week      PT  Plan Discharge plan needs to be updated    Co-evaluation PT/OT/SLP Co-Evaluation/Treatment: Yes Reason for Co-Treatment: For patient/therapist safety PT goals addressed during session: Mobility/safety with mobility;Balance;Proper use of DME       End of Session Equipment Utilized During Treatment: Cervical collar;Gait belt Activity Tolerance: Patient tolerated treatment well Patient  left: in chair;with call bell/phone within reach;with chair alarm set;with family/visitor present     Time: [id number] PT Time Calculation (min) (ACUTE ONLY): 23 min  Charges:  $Gait Training: 8-22 mins                    G Codes:      Salina April, PTA Pager: [phone]   05/12/2016, 3:34 PM

## 2016-05-12 NOTE — Progress Notes (Signed)
Occupational Therapy Treatment Patient Details Name: Benjamin HERZ Sr. MRN: [id number] DOB: [birth year] Today's Date: 05/12/2016    History of present illness Patient is a 70 y/o male with hx of vertigo, skin ca, HTN, gout and BPH present s/p C3-4, 4-5, 5-6 laminectomy with lateral mass fusion.   OT comments  Pt continues to demonstrate self-limiting behavior and refusing to participate with therapy. Pt reported significant pain and numbness in bilateral UE and down spine. Pt with BM in bed on OT arrival and requires mod assistance to roll side-side and complete pericare and dressing tasks in bed as pt refused to sit EOB or stand to complete tasks. Firmly encouraged pt to mobilize while in hospital to prepare for rehab and to minimize infection risk and explained to pt that therapist would be coming back with +2 assistance to mobilize him to the chair later today. Will come back to see pt today.   Follow Up Recommendations  SNF;Supervision/Assistance - 24 hour    Equipment Recommendations  Other (comment) (TBD at next venue of care)    Recommendations for Other Services      Precautions / Restrictions Precautions Precautions: Fall;Cervical Required Braces or Orthoses: Cervical Brace Cervical Brace: Hard collar;At all times Restrictions Weight Bearing Restrictions: No       Mobility Bed Mobility Overal bed mobility: Needs Assistance Bed Mobility: Rolling Rolling: Min guard         General bed mobility comments: Pt able to roll side-side without physical assistance despite saying "I can't do it. It hurts." HOB flat, use of bedrails.  Transfers                 General transfer comment: Pt refused to mobilize to EOB or to for OT/NT to complete pericare and dressing tasks.    Balance                                   ADL Overall ADL's : Needs assistance/impaired                 Upper Body Dressing : Moderate assistance;Bed level   Lower  Body Dressing: Maximal assistance;Bed level       Toileting- Clothing Manipulation and Hygiene: Moderate assistance;Bed level;Cueing for sequencing         General ADL Comments: Pt refused to sit EOB and stand for pericare and dressing tasks after having BM in depends while laying in bed. Firmly emphasized the importance of mobilizing OOB and to use 3in1 instead of voiding in depends. Pt's wife stated "It's fine if he goes in his depends. I'll just keep changing him."      Vision                     Perception     Praxis      Cognition   Behavior During Therapy: Flat affect Overall Cognitive Status: Within Functional Limits for tasks assessed                       Extremity/Trunk Assessment               Exercises     Shoulder Instructions       General Comments      Pertinent Vitals/ Pain       Pain Assessment: Faces Faces Pain Scale: Hurts whole lot Pain Location: neck, back, BUEs R>L Pain  Descriptors / Indicators: Aching;Sore Pain Intervention(s): Limited activity within patient's tolerance;Monitored during session;Premedicated before session;Repositioned  Home Living                                          Prior Functioning/Environment              Frequency  Min 2X/week        Progress Toward Goals  OT Goals(current goals can now be found in the care plan section)  Progress towards OT goals: Not progressing toward goals - comment (pt self-limiting and refusing to participate)  Acute Rehab OT Goals Patient Stated Goal: none stated OT Goal Formulation: With patient Time For Goal Achievement: 05/25/16 Potential to Achieve Goals: Good ADL Goals Pt Will Perform Grooming: with supervision;standing Pt Will Perform Upper Body Bathing: with supervision;sitting Pt Will Perform Lower Body Bathing: with supervision;sit to/from stand Pt Will Transfer to Toilet: with supervision;ambulating;regular height  toilet Pt Will Perform Toileting - Clothing Manipulation and hygiene: with supervision;sit to/from stand Additional ADL Goal #1: Pt/caregiver will independently don/doff cervical collar.  Plan Discharge plan remains appropriate    Co-evaluation                 End of Session Equipment Utilized During Treatment: Cervical collar   Activity Tolerance Patient limited by pain   Patient Left in bed;with call bell/phone within reach;with bed alarm set;with family/visitor present   Nurse Communication Mobility status        Time: 1022-1030 OT Time Calculation (min): 8 min  Charges: OT General Charges $OT Visit: 1 Procedure OT Treatments $Self Care/Home Management : 8-22 mins  Redmond Baseman, OTR/L Pager: [phone] 05/12/2016, 1:28 PM

## 2016-05-13 MED ORDER — DEXAMETHASONE 4 MG PO TABS
4.0000 mg | ORAL_TABLET | Freq: Two times a day (BID) | ORAL | Status: DC
  Administered 2016-05-13 – 2016-05-15 (×5): 4 mg via ORAL
  Filled 2016-05-13 (×5): qty 1

## 2016-05-13 MED ORDER — GABAPENTIN 300 MG PO CAPS
300.0000 mg | ORAL_CAPSULE | Freq: Three times a day (TID) | ORAL | Status: DC
  Administered 2016-05-13 – 2016-05-15 (×6): 300 mg via ORAL
  Filled 2016-05-13 (×6): qty 1

## 2016-05-13 NOTE — Clinical Social Work Note (Signed)
Clinical Social Work Assessment  Patient Details  Name: Benjamin CUDMORE Sr. MRN: [id number] Date of Birth: 07-04-1946  Date of referral:  05/13/16               Reason for consult:  Facility Placement                Permission sought to share information with:  Facility Sport and exercise psychologist, Family Supports Permission granted to share information::  Yes, Verbal Permission Granted  Name::     Financial planner::  SNFs  Relationship::  Spouse  Contact Information:  [phone]  Housing/Transportation Living arrangements for the past 2 months:  [place] of Information:  Patient, Spouse Patient Interpreter Needed:  None Criminal Activity/Legal Involvement Pertinent to Current Situation/Hospitalization:  No - Comment as needed Significant Relationships:  Adult Children, Spouse Lives with:  Spouse Do you feel safe going back to the place where you live?  No Need for family participation in patient care:  Yes (Comment)  Care giving concerns:  CSW received consult for possible SNF placement at time of discharge. CSW met with patient and patient's spouse at bedside regarding PT recommendation of SNF placement at time of discharge. Per patient's spouse, patient's spouse is currently unable to care for patient at their home given patient's current physical needs and fall risk. Patient and patient's spouse expressed understanding of PT recommendation and are agreeable to SNF placement at time of discharge. CSW to continue to follow and assist with discharge planning needs.   Social Worker assessment / plan:  CSW spoke with patient and patient's spouse concerning possibility of rehab at [facility] before returning home.  Employment status:  Retired Nurse, adult PT Recommendations:  [place] / Referral to community resources:  [place]  Patient/Family's Response to care:  Patient and patient's spouse recognize need  for rehab before returning home and are agreeable to a SNF in [place]. Patient reported preference for [facility].  Patient/Family's Understanding of and Emotional Response to Diagnosis, Current Treatment, and Prognosis:  Patient/family is realistic regarding therapy needs and expressed being hopeful for SNF placement. Patient expressed understanding of CSW role and discharge process. No questions/concerns about plan or treatment.    Emotional Assessment Appearance:  Appears stated age Attitude/Demeanor/Rapport:  Other (Appropriate) Affect (typically observed):  Accepting, Appropriate Orientation:  Oriented to Self, Oriented to Situation, Oriented to Place, Oriented to  Time Alcohol / Substance use:  Not Applicable Psych involvement (Current and /or in the community):  No (Comment)  Discharge Needs  Concerns to be addressed:  Care Coordination Readmission within the last 30 days:  No Current discharge risk:  None Barriers to Discharge:  Continued Medical Work up   Benjamin Turner, [place] 05/13/2016, 4:28 PM

## 2016-05-13 NOTE — Progress Notes (Signed)
No issues overnight. Pt cont to c/o arm numbness and neck pain.  EXAM:  BP (!) 119/59 (BP Location: Left Arm)   Pulse (!) 110   Temp 98.4 F (36.9 C) (Oral)   Resp 18   Ht 5\' 8"  (1.727 m)   Wt 79.8 kg (176 lb)   SpO2 94%   BMI 26.76 kg/m   Awake, alert, oriented  Speech fluent, appropriate  CN grossly intact  Good strength  IMAGING: MRI reviewed, no ongoing spinal cord compression  IMPRESSION:  70 y.o. male s/p cervical lam/lat mass fusion. Has continued neck pain, and arm numbness.  PLAN: - Will add neurontin and dexamethasone - SNF placment

## 2016-05-13 NOTE — Progress Notes (Signed)
Physical Therapy Treatment Patient Details Name: Benjamin STEINER Sr. MRN: [id number] DOB: 04/24/1946 Today's Date: 05/13/2016    History of Present Illness Patient is a 70 y/o male with hx of vertigo, skin ca, HTN, gout and BPH present s/p C3-4, 4-5, 5-6 laminectomy with lateral mass fusion.    PT Comments    Patient limited by fatigue. +2 for safety with OOB mobility. Pt required max encouragement to participate and educated extensively on benefits of OOB mobility. Current plan remains appropriate.   Follow Up Recommendations  SNF;Supervision/Assistance - 24 hour     Equipment Recommendations  Other (comment) (TBD)    Recommendations for Other Services OT consult     Precautions / Restrictions Precautions Precautions: Cervical;Fall Required Braces or Orthoses: Cervical Brace Cervical Brace: Hard collar;At all times Restrictions Weight Bearing Restrictions: No    Mobility  Bed Mobility Overal bed mobility: Needs Assistance Bed Mobility: Rolling;Sidelying to Sit Rolling: Min guard Sidelying to sit: Min assist;+2 for physical assistance       General bed mobility comments: pt reported he could not get out of bed because he can't move his arms but pt was actively moving B UE; cues for sequencing/technique with hand over hand assist to reach bed rail and use of bed pad to assist hips/trunk into sidelying; assist to elevate trunk into sitting, and bring bilat LE over EOB  Transfers Overall transfer level: Needs assistance Equipment used: Rolling walker (2 wheeled) Transfers: Sit to/from Stand Sit to Stand: Min assist;+2 safety/equipment         General transfer comment: cues for hand placement and safe use of AD; assist to power up into standing  Ambulation/Gait Ambulation/Gait assistance: Min assist Ambulation Distance (Feet): 80 Feet Assistive device: Rolling walker (2 wheeled) Gait Pattern/deviations: Shuffle;Trunk flexed;Narrow base of support Gait velocity:  decreased   General Gait Details: cues for proximity of RW, bilat heel strike, safe use of AD, and posture; pt with tendency to increased trunk and bilat knee flexion and decrease step lenngth with fatigue; cues needed to maintian bilat hand placement on RW; chair follow   Stairs            Wheelchair Mobility    Modified Rankin (Stroke Patients Only)       Balance     Sitting balance-Leahy Scale: Fair       Standing balance-Leahy Scale: Poor                      Cognition Arousal/Alertness: Awake/alert Behavior During Therapy: Flat affect Overall Cognitive Status: Within Functional Limits for tasks assessed                      Exercises      General Comments        Pertinent Vitals/Pain Pain Assessment: Faces Faces Pain Scale: Hurts little more Pain Location: neck Pain Descriptors / Indicators: Aching;Sore Pain Intervention(s): Limited activity within patient's tolerance;Monitored during session;Premedicated before session;Repositioned    Home Living                      Prior Function            PT Goals (current goals can now be found in the care plan section) Acute Rehab PT Goals Patient Stated Goal: none stated Progress towards PT goals: Progressing toward goals    Frequency    Min 5X/week      PT Plan Discharge plan needs to  be updated    Co-evaluation             End of Session Equipment Utilized During Treatment: Cervical collar;Gait belt Activity Tolerance: Patient limited by fatigue Patient left: in chair;with call bell/phone within reach;with chair alarm set;with family/visitor present     Time: 1331-1403 PT Time Calculation (min) (ACUTE ONLY): 32 min  Charges:  $Gait Training: 8-22 mins $Therapeutic Activity: 8-22 mins                    G Codes:      [place] April, PTA Pager: [phone]   05/13/2016, 3:29 PM

## 2016-05-13 NOTE — NC FL2 (Signed)
[place] LEVEL OF CARE SCREENING TOOL     IDENTIFICATION  Patient Name: Benjamin Turner. Birthdate: 05/22/1946 Sex: male Admission Date (Current Location): 05/09/2016  [facility] and [place] Number:  Herbalist and Address:  [facility], [address], [place], [place] [address]      Provider Number: [id number]  Attending Physician Name and Address:  Consuella Lose, MD  Relative Name and Phone Number:  Jedediah Al - wife.  Phone [phone] (mobile) and [phone] (home)    Current Level of Care: Hospital Recommended Level of Care: [place] Prior Approval Number:    Date Approved/Denied:   PASRR Number: [id number] (Eff. 05/13/16)  Discharge Plan: SNF    Current Diagnoses: Patient Active Problem List   Diagnosis Date Noted  . Cervical spondylosis with myelopathy 05/09/2016  . Malnutrition of moderate degree 01/16/2016  . Weakness 01/14/2016  . Headache 01/14/2016  . Spinal cord compression ([place]) 09/14/2015  . Paresthesia of arm 09/14/2015  . Hyponatremia 09/14/2015  . Essential hypertension 09/14/2015  . Skin cancer 09/14/2015  . Debility 09/14/2015  . Right arm weakness 09/14/2015  . Nonspecific abnormal electrocardiogram (ECG) (EKG) 09/14/2015  . Bradycardia 09/14/2015    Orientation RESPIRATION BLADDER Height & Weight     Self, Time, Situation, Place  Normal Continent Weight: 176 lb (79.8 kg) Height:  5\' 8"  (172.7 cm)  BEHAVIORAL SYMPTOMS/MOOD NEUROLOGICAL BOWEL NUTRITION STATUS      Continent Diet (Soft)  AMBULATORY STATUS COMMUNICATION OF NEEDS Skin   Limited Assist Verbally Normal, Other (Comment) (Closed incision on neck)                       Personal Care Assistance Level of Assistance  Bathing, Feeding, Dressing Bathing Assistance: Maximum assistance Feeding assistance: Independent Dressing Assistance: Maximum assistance     Functional Limitations Info  Sight,  Hearing, Speech Sight Info: Adequate Hearing Info: Adequate Speech Info: Adequate    SPECIAL CARE FACTORS FREQUENCY  PT (By licensed PT), OT (By licensed OT)     PT Frequency: Evaluated 9/15 and a minimum of 5X per week therapy recommended OT Frequency: Evaluated 9/16 and a minimum of 2X per week therapy recommended            Contractures Contractures Info: Not present    Additional Factors Info  Code Status, Allergies Code Status Info: Full Allergies Info: Codeine           Current Medications (05/13/2016):  This is the current hospital active medication list Current Facility-Administered Medications  Medication Dose Route Frequency Provider Last Rate Last Dose  . 0.9 %  sodium chloride infusion   Intravenous Continuous Consuella Lose, MD      . 0.9 %  sodium chloride infusion  250 mL Intravenous Continuous Consuella Lose, MD 1 mL/hr at 05/09/16 2209 250 mL at 05/09/16 2209  . acetaminophen (TYLENOL) tablet 650 mg  650 mg Oral Q4H PRN Consuella Lose, MD       Or  . acetaminophen (TYLENOL) suppository 650 mg  650 mg Rectal Q4H PRN Consuella Lose, MD      . bisacodyl (DULCOLAX) suppository 10 mg  10 mg Rectal Daily PRN Consuella Lose, MD      . dexamethasone (DECADRON) tablet 4 mg  4 mg Oral Q12H Consuella Lose, MD   4 mg at 05/13/16 1400  . diazepam (VALIUM) tablet 5 mg  5 mg Oral Q8H Consuella Lose, MD  5 mg at 05/13/16 1400  . diclofenac (VOLTAREN) EC tablet 75 mg  75 mg Oral BID Consuella Lose, MD   75 mg at 05/13/16 1036  . docusate sodium (COLACE) capsule 100 mg  100 mg Oral BID Consuella Lose, MD   100 mg at 05/11/16 2152  . feeding supplement (ENSURE ENLIVE) (ENSURE ENLIVE) liquid 237 mL  237 mL Oral TID AC Consuella Lose, MD   237 mL at 05/13/16 1033  . fluticasone (FLONASE) 50 MCG/ACT nasal spray 2 spray  2 spray Each Nare Daily PRN Consuella Lose, MD      . gabapentin (NEURONTIN) capsule 300 mg  300 mg Oral TID Consuella Lose,  MD      . linaclotide Rolan Lipa) capsule 290 mcg  290 mcg Oral QAC breakfast Consuella Lose, MD      . lisinopril (PRINIVIL,ZESTRIL) tablet 20 mg  20 mg Oral Daily Consuella Lose, MD   20 mg at 05/13/16 1035  . menthol-cetylpyridinium (CEPACOL) lozenge 3 mg  1 lozenge Oral PRN Consuella Lose, MD       Or  . phenol (CHLORASEPTIC) mouth spray 1 spray  1 spray Mouth/Throat PRN Consuella Lose, MD      . ondansetron (ZOFRAN) injection 4 mg  4 mg Intravenous Q4H PRN Consuella Lose, MD   4 mg at 05/10/16 2257  . ondansetron (ZOFRAN-ODT) disintegrating tablet 4 mg  4 mg Oral Q8H PRN Consuella Lose, MD      . oxyCODONE-acetaminophen (PERCOCET/ROXICET) 5-325 MG per tablet 1-2 tablet  1-2 tablet Oral Q4H PRN Consuella Lose, MD   2 tablet at 05/13/16 1035  . pantoprazole (PROTONIX) EC tablet 40 mg  40 mg Oral BID Kevan Ny Ditty, MD   40 mg at 05/13/16 1035  . sodium chloride flush (NS) 0.9 % injection 3 mL  3 mL Intravenous Q12H Consuella Lose, MD   3 mL at 05/13/16 1035  . sodium chloride flush (NS) 0.9 % injection 3 mL  3 mL Intravenous PRN Consuella Lose, MD      . tamsulosin (FLOMAX) capsule 0.4 mg  0.4 mg Oral Daily Consuella Lose, MD   0.4 mg at 05/13/16 1035     Discharge Medications: Please see discharge summary for a list of discharge medications.  Relevant Imaging Results:  Relevant Lab Results:   Additional Information ss#[id number].   Sable Feil, LCSW

## 2016-05-13 NOTE — Care Management Note (Signed)
Case Management Note  Patient Details  Name: CAMARI TRAVERS Sr. MRN: [id number] Date of Birth: 1946-04-19  Subjective/Objective:                    Action/Plan: Recommendations are for SNF. CM following for discharge disposition.  Expected Discharge Date:  05/11/16               Expected Discharge Plan:  [place]  In-House Referral:  NA  Discharge planning Services  CM Consult  Post Acute Care Choice:  Home Health Choice offered to:  Patient, Spouse  DME Arranged:    DME Agency:     HH Arranged:  PT [place]:  [place]  Status of Service:  In process, will continue to follow  If discussed at Long Length of Stay Meetings, dates discussed:    Additional Comments:  Pollie Friar, RN 05/13/2016, 7:45 PM

## 2016-05-14 ENCOUNTER — Encounter (HOSPITAL_COMMUNITY): Payer: Self-pay | Admitting: Neurosurgery

## 2016-05-14 NOTE — Care Management Important Message (Signed)
Important Message  Patient Details  Name: Benjamin FINNEY Sr. MRN: [id number] Date of Birth: Apr 04, 1946   Medicare Important Message Given:  Yes    [name] 05/14/2016, 10:26 AM

## 2016-05-14 NOTE — Progress Notes (Signed)
Physical Therapy Treatment Patient Details Name: Benjamin FARWELL Sr. MRN: [id number] DOB: 1945-12-29 Today's Date: 05/14/2016    History of Present Illness Patient is a 70 y/o male with hx of vertigo, skin ca, HTN, gout and BPH present s/p C3-4, 4-5, 5-6 laminectomy with lateral mass fusion.    PT Comments    Patient is making gradual progress toward goals. Current plan remains appropriate.   Follow Up Recommendations  SNF;Supervision/Assistance - 24 hour     Equipment Recommendations  Other (comment) (TBD)    Recommendations for Other Services OT consult     Precautions / Restrictions Precautions Precautions: Cervical;Fall Required Braces or Orthoses: Cervical Brace Cervical Brace: Hard collar;At all times Restrictions Weight Bearing Restrictions: No    Mobility  Bed Mobility Overal bed mobility: Needs Assistance Bed Mobility: Rolling;Sidelying to Sit Rolling: Min guard Sidelying to sit: Min assist;+2 for physical assistance       General bed mobility comments: cues for technique; assis to elevate trunk into sitting  Transfers Overall transfer level: Needs assistance Equipment used: Rolling walker (2 wheeled) Transfers: Sit to/from Stand Sit to Stand: Min assist         General transfer comment: cues for hand placement  Ambulation/Gait Ambulation/Gait assistance: Min assist Ambulation Distance (Feet): 150 Feet Assistive device: Rolling walker (2 wheeled) Gait Pattern/deviations: Shuffle;Trunk flexed Gait velocity: decreased   General Gait Details: mulitmoda cues for posture; assist to manage RW; pt with unsteadiness at times but no LOB; increased trunk and bilat knee flexion with fatigue   Stairs            Wheelchair Mobility    Modified Rankin (Stroke Patients Only)       Balance     Sitting balance-Leahy Scale: Poor Sitting balance - Comments: posterior LOB without UE support when donning gown     Standing balance-Leahy Scale:  Poor                      Cognition Arousal/Alertness: Awake/alert Behavior During Therapy: Flat affect Overall Cognitive Status: Within Functional Limits for tasks assessed                      Exercises      General Comments        Pertinent Vitals/Pain Pain Assessment: Faces Faces Pain Scale: Hurts little more Pain Location: neck Pain Descriptors / Indicators: Sore;Aching Pain Intervention(s): Limited activity within patient's tolerance;Monitored during session;Repositioned;RN gave pain meds during session    Home Living                      Prior Function            PT Goals (current goals can now be found in the care plan section) Acute Rehab PT Goals Patient Stated Goal: none stated Progress towards PT goals: Progressing toward goals    Frequency    Min 5X/week      PT Plan Current plan remains appropriate    Co-evaluation             End of Session Equipment Utilized During Treatment: Cervical collar;Gait belt Activity Tolerance: Patient limited by pain Patient left: in chair;with call bell/phone within reach;with chair alarm set;with family/visitor present;with nursing/sitter in room     Time: 1601-1620 PT Time Calculation (min) (ACUTE ONLY): 19 min  Charges:  $Gait Training: 8-22 mins  G Codes:      [place] April, PTA Pager: [phone]   05/14/2016, 4:50 PM

## 2016-05-14 NOTE — Clinical Social Work Note (Signed)
CSW talked with patient at the bedside and with wife by phone regarding facility responses and  [facility] (made bed offer) chosen by Mrs. Heigl. Call made to [facility], admissions director at [facility] and message left regarding patient choosing facility and insurance authorization needed.  CSW will follow up with Joelene Millin on 9/20 regarding patient discharging to [facility].  Alano Blasco Givens, MSW, LCSW Licensed Clinical Social Worker [place] [phone]

## 2016-05-14 NOTE — Progress Notes (Signed)
No issues overnight. Has same c/o tingling in arms when up, although he reports mild improvement today.   EXAM:  BP 116/67 (BP Location: Left Arm)   Pulse (!) 109   Temp 98 F (36.7 C) (Oral)   Resp 20   Ht 5\' 8"  (1.727 m)   Wt 79.8 kg (176 lb)   SpO2 97%   BMI 26.76 kg/m   Awake, alert, oriented  Speech fluent, appropriate  CN grossly intact  5/5 BUE/BLE   IMPRESSION:  70 y.o. male s/p cervical lam/fusion. Recovering slowly.  PLAN: - Cont neurontin - Will stop dexamethasone on discharge - Stable for d/c to snf when bed available

## 2016-05-15 MED ORDER — OXYCODONE-ACETAMINOPHEN 5-325 MG PO TABS: 1.0000 | ORAL_TABLET | Freq: Three times a day (TID) | ORAL | 0 refills | Status: DC | PRN

## 2016-05-15 MED ORDER — GABAPENTIN 300 MG PO CAPS: 300.0000 mg | ORAL_CAPSULE | Freq: Three times a day (TID) | ORAL | 0 refills | Status: DC

## 2016-05-15 MED ORDER — DIAZEPAM 5 MG PO TABS: 5.0000 mg | ORAL_TABLET | Freq: Three times a day (TID) | ORAL | 0 refills | Status: DC

## 2016-05-15 MED ORDER — PREDNISONE 10 MG (21) PO TBPK: 10.0000 mg | ORAL_TABLET | Freq: Four times a day (QID) | ORAL | 0 refills | Status: DC

## 2016-05-15 NOTE — Progress Notes (Signed)
Patient is being d/c to a skilled nursing facility. Report called to the receiving nurse. Patient awaiting PTAR at this time.

## 2016-05-15 NOTE — Discharge Summary (Signed)
  Physician Discharge Summary  Patient ID: Benjamin ROTTIER Sr. MRN: [id number] DOB/AGE: 08/26/1946 70 y.o.  Admit date: 05/09/2016 Discharge date: 05/15/2016  Admission Diagnoses: Cervical spondylosis with myelopathy  Discharge Diagnoses: Same Active Problems:   Cervical spondylosis with myelopathy   Discharged Condition: Stable  Hospital Course:  [name] is a 70 y.o. male electively admitted after C3-6 laminectomy and lateral mass fusion. He was at baseline postop, but was complaining of neck pressure and N/T in the arms especially while standing. MRI was done which did not demonstrate any spinal cord compression. He continued to recover slowly, and was evaluated by PT/OT. He will require SNF placement prior to return home and was discharged in stable condition.  Treatments: Surgery - C3-6 laminectomy/lateral mass fusion  Discharge Exam: Blood pressure 123/69, pulse 88, temperature 98 F (36.7 C), temperature source Oral, resp. rate 18, height 5\' 8"  (1.727 m), weight 79.8 kg (176 lb), SpO2 97 %. Awake, alert, oriented Speech fluent, appropriate CN grossly intact 5/5 BUE/BLE Wound c/d/i  Disposition: SNF     Medication List    TAKE these medications   diazepam 5 MG tablet Commonly known as:  VALIUM Take 1 tablet (5 mg total) by mouth every 8 (eight) hours.   diclofenac 75 MG EC tablet Commonly known as:  VOLTAREN Take 75 mg by mouth 2 (two) times daily.   feeding supplement (ENSURE ENLIVE) Liqd Take 237 mLs by mouth 2 (two) times daily between meals.   fluticasone 50 MCG/ACT nasal spray Commonly known as:  FLONASE Place 2 sprays into both nostrils daily as needed for allergies or rhinitis.   gabapentin 300 MG capsule Commonly known as:  NEURONTIN Take 1 capsule (300 mg total) by mouth 3 (three) times daily.   LINZESS 290 MCG Caps capsule Generic drug:  linaclotide Take 290 mcg by mouth daily.   lisinopril 20 MG tablet Commonly known as:   PRINIVIL,ZESTRIL Take 1 tablet (20 mg total) by mouth daily.   omeprazole 40 MG capsule Commonly known as:  PRILOSEC Take 40 mg by mouth daily.   ondansetron 4 MG disintegrating tablet Commonly known as:  ZOFRAN ODT Take 1 tablet (4 mg total) by mouth every 8 (eight) hours as needed for nausea or vomiting.   oxyCODONE-acetaminophen 5-325 MG tablet Commonly known as:  PERCOCET/ROXICET Take 1-2 tablets by mouth every 8 (eight) hours as needed for severe pain. What changed:  how much to take   predniSONE 10 MG (21) Tbpk tablet Commonly known as:  STERAPRED UNI-PAK 21 TAB Take 1 tablet (10 mg total) by mouth taper from 4 doses each day to 1 dose and stop.   tamsulosin 0.4 MG Caps capsule Commonly known as:  FLOMAX Take 1 capsule (0.4 mg total) by mouth daily.        SignedConsuella Lose, C 05/15/2016, 8:52 AM

## 2016-05-15 NOTE — Progress Notes (Signed)
Physical Therapy Treatment Patient Details Name: Benjamin YEARGIN Sr. MRN: [id number] DOB: March 20, 1946 Today's Date: 05/15/2016    History of Present Illness Patient is a 70 y/o male with hx of vertigo, skin ca, HTN, gout and BPH present s/p C3-4, 4-5, 5-6 laminectomy with lateral mass fusion.    PT Comments    Patient continues to make gradual progress toward mobility goals. Continues to need assistance for bed mobility and OOB mobility. Current plan remains appropriate.   Follow Up Recommendations  SNF;Supervision/Assistance - 24 hour     Equipment Recommendations  Other (comment) (TBD)    Recommendations for Other Services OT consult     Precautions / Restrictions Precautions Precautions: Cervical;Fall Required Braces or Orthoses: Cervical Brace Cervical Brace: Hard collar;At all times Restrictions Weight Bearing Restrictions: No    Mobility  Bed Mobility Overal bed mobility: Needs Assistance Bed Mobility: Rolling;Sidelying to Sit Rolling: Min assist Sidelying to sit: Mod assist       General bed mobility comments: cues for sequencing and technique; use of rail and assist to roll and elevate trunk   Transfers Overall transfer level: Needs assistance Equipment used: Rolling walker (2 wheeled) Transfers: Sit to/from Stand Sit to Stand: Min assist         General transfer comment: cues for hand placement; assis to power up into standing  Ambulation/Gait Ambulation/Gait assistance: Min assist;Mod assist Ambulation Distance (Feet): 175 Feet Assistive device: Rolling walker (2 wheeled) Gait Pattern/deviations: Step-through pattern;Decreased step length - left;Decreased dorsiflexion - left;Shuffle;Trunk flexed Gait velocity: decreased   General Gait Details: multimodal cues for posture and max vc for bilat heel strike, posture, proximity of RW, bilat step length; assist to faciliate more upright posture and management of RW   Stairs             Wheelchair Mobility    Modified Rankin (Stroke Patients Only)       Balance     Sitting balance-Leahy Scale: Fair       Standing balance-Leahy Scale: Poor                      Cognition Arousal/Alertness: Awake/alert Behavior During Therapy: Flat affect Overall Cognitive Status: Within Functional Limits for tasks assessed                      Exercises      General Comments        Pertinent Vitals/Pain Pain Assessment: Faces Faces Pain Scale: Hurts little more Pain Location: neck Pain Descriptors / Indicators: Aching;Sore;Guarding Pain Intervention(s): Limited activity within patient's tolerance;Monitored during session;Premedicated before session;Repositioned    Home Living                      Prior Function            PT Goals (current goals can now be found in the care plan section) Acute Rehab PT Goals Patient Stated Goal: get to rehab Progress towards PT goals: Progressing toward goals    Frequency    Min 5X/week      PT Plan Current plan remains appropriate    Co-evaluation             End of Session Equipment Utilized During Treatment: Cervical collar;Gait belt Activity Tolerance: Patient tolerated treatment well Patient left: in chair;with call bell/phone within reach;with family/visitor present     Time: [id number] PT Time Calculation (min) (ACUTE ONLY): 34 min  Charges:  $Gait  Training: 8-22 mins $Therapeutic Activity: 8-22 mins                    G Codes:      Salina April, PTA Pager: [phone]   05/15/2016, 3:42 PM

## 2016-05-15 NOTE — Clinical Social Work Placement (Signed)
   CLINICAL SOCIAL WORK PLACEMENT  NOTE  Date:  05/15/2016  Patient Details  Name: Benjamin Turner Sr. MRN: [id number] Date of Birth: [birth year]  Clinical Social Work is seeking post-discharge placement for this patient at the [place] level of care (*CSW will initial, date and re-position this form in  chart as items are completed):      Patient/family provided with [place] Work Department's list of facilities offering this level of care within the geographic area requested by the patient (or if unable, by the patient's family).      Patient/family informed of their freedom to choose among providers that offer the needed level of care, that participate in Medicare, Medicaid or managed care program needed by the patient, have an available bed and are willing to accept the patient.      Patient/family informed of [facility]'s ownership interest in [facility] and [facility], as well as of the fact that they are under no obligation to receive care at these facilities.  PASRR submitted to EDS on 05/13/16     PASRR number received on 05/13/16     Existing PASRR number confirmed on       FL2 transmitted to all facilities in geographic area requested by pt/family on 05/13/16     FL2 transmitted to all facilities within larger geographic area on       Patient informed that his/her managed care company has contracts with or will negotiate with certain facilities, including the following:        Yes   Patient/family informed of bed offers received.  Patient chooses bed at  [facility] recommends and patient chooses bed at      Patient to be transferred to  [facility] on  05/15/16.  Patient to be transferred to facility by  ambulance Corey Harold)     Patient family notified on  05/15/16 of transfer.  Name of family member notified:   Wife Vaughan Basta at the bedside.     PHYSICIAN Please sign FL2     Additional Comment:     _______________________________________________ Sable Feil, LCSW 05/15/2016, 6:50 PM

## 2016-05-19 ENCOUNTER — Emergency Department: Payer: Medicare HMO

## 2016-05-19 ENCOUNTER — Inpatient Hospital Stay
Admission: EM | Admit: 2016-05-19 | Discharge: 2016-05-21 | DRG: 392 | Disposition: A | Discharge status: Skilled Nursing Facility | Payer: Medicare HMO | Attending: Internal Medicine | Admitting: Internal Medicine

## 2016-05-19 DIAGNOSIS — I10 Essential (primary) hypertension: Secondary | ICD-10-CM | POA: Diagnosis present

## 2016-05-19 DIAGNOSIS — Z85828 Personal history of other malignant neoplasm of skin: Secondary | ICD-10-CM

## 2016-05-19 DIAGNOSIS — D72829 Elevated white blood cell count, unspecified: Secondary | ICD-10-CM | POA: Diagnosis present

## 2016-05-19 DIAGNOSIS — E860 Dehydration: Secondary | ICD-10-CM | POA: Diagnosis not present

## 2016-05-19 DIAGNOSIS — R2689 Other abnormalities of gait and mobility: Secondary | ICD-10-CM

## 2016-05-19 DIAGNOSIS — K219 Gastro-esophageal reflux disease without esophagitis: Secondary | ICD-10-CM | POA: Diagnosis present

## 2016-05-19 DIAGNOSIS — Z981 Arthrodesis status: Secondary | ICD-10-CM

## 2016-05-19 DIAGNOSIS — M6281 Muscle weakness (generalized): Secondary | ICD-10-CM

## 2016-05-19 DIAGNOSIS — R0602 Shortness of breath: Secondary | ICD-10-CM

## 2016-05-19 DIAGNOSIS — R000 Tachycardia, unspecified: Secondary | ICD-10-CM | POA: Diagnosis present

## 2016-05-19 DIAGNOSIS — R1084 Generalized abdominal pain: Secondary | ICD-10-CM

## 2016-05-19 DIAGNOSIS — M109 Gout, unspecified: Secondary | ICD-10-CM | POA: Diagnosis present

## 2016-05-19 DIAGNOSIS — Z9889 Other specified postprocedural states: Secondary | ICD-10-CM

## 2016-05-19 DIAGNOSIS — Z79899 Other long term (current) drug therapy: Secondary | ICD-10-CM

## 2016-05-19 DIAGNOSIS — Z8249 Family history of ischemic heart disease and other diseases of the circulatory system: Secondary | ICD-10-CM

## 2016-05-19 DIAGNOSIS — Z886 Allergy status to analgesic agent status: Secondary | ICD-10-CM

## 2016-05-19 DIAGNOSIS — R0682 Tachypnea, not elsewhere classified: Secondary | ICD-10-CM | POA: Diagnosis present

## 2016-05-19 DIAGNOSIS — A419 Sepsis, unspecified organism: Secondary | ICD-10-CM

## 2016-05-19 DIAGNOSIS — R140 Abdominal distension (gaseous): Secondary | ICD-10-CM | POA: Diagnosis present

## 2016-05-19 DIAGNOSIS — K5900 Constipation, unspecified: Principal | ICD-10-CM | POA: Diagnosis present

## 2016-05-19 DIAGNOSIS — Z7951 Long term (current) use of inhaled steroids: Secondary | ICD-10-CM

## 2016-05-19 DIAGNOSIS — R011 Cardiac murmur, unspecified: Secondary | ICD-10-CM | POA: Diagnosis present

## 2016-05-19 DIAGNOSIS — R197 Diarrhea, unspecified: Secondary | ICD-10-CM

## 2016-05-19 DIAGNOSIS — T380X5A Adverse effect of glucocorticoids and synthetic analogues, initial encounter: Secondary | ICD-10-CM | POA: Diagnosis present

## 2016-05-19 DIAGNOSIS — M1990 Unspecified osteoarthritis, unspecified site: Secondary | ICD-10-CM | POA: Diagnosis present

## 2016-05-19 DIAGNOSIS — I480 Paroxysmal atrial fibrillation: Secondary | ICD-10-CM | POA: Diagnosis present

## 2016-05-19 DIAGNOSIS — Z23 Encounter for immunization: Secondary | ICD-10-CM

## 2016-05-19 LAB — CBC
HEMATOCRIT: 37.8 % — AB (ref 40.0–52.0)
HEMOGLOBIN: 12.4 g/dL — AB (ref 13.0–18.0)
MCH: 29.1 pg (ref 26.0–34.0)
MCHC: 32.7 g/dL (ref 32.0–36.0)
MCV: 88.8 fL (ref 80.0–100.0)
Platelets: 351 10*3/uL (ref 150–440)
RBC: 4.26 MIL/uL — AB (ref 4.40–5.90)
RDW: 14.2 % (ref 11.5–14.5)
WBC: 15.5 10*3/uL — AB (ref 3.8–10.6)

## 2016-05-19 LAB — URINALYSIS COMPLETE WITH MICROSCOPIC (ARMC ONLY)
BACTERIA UA: NONE SEEN
Bilirubin Urine: NEGATIVE
GLUCOSE, UA: NEGATIVE mg/dL
HGB URINE DIPSTICK: NEGATIVE
KETONES UR: NEGATIVE mg/dL
LEUKOCYTES UA: NEGATIVE
NITRITE: NEGATIVE
PROTEIN: 30 mg/dL — AB
SPECIFIC GRAVITY, URINE: 1.034 — AB (ref 1.005–1.030)
pH: 5 (ref 5.0–8.0)

## 2016-05-19 LAB — COMPREHENSIVE METABOLIC PANEL
ALT: 24 U/L (ref 17–63)
ANION GAP: 5 (ref 5–15)
AST: 21 U/L (ref 15–41)
Albumin: 2.9 g/dL — ABNORMAL LOW (ref 3.5–5.0)
Alkaline Phosphatase: 59 U/L (ref 38–126)
BUN: 35 mg/dL — ABNORMAL HIGH (ref 6–20)
CHLORIDE: 104 mmol/L (ref 101–111)
CO2: 27 mmol/L (ref 22–32)
Calcium: 9.3 mg/dL (ref 8.9–10.3)
Creatinine, Ser: 0.84 mg/dL (ref 0.61–1.24)
Glucose, Bld: 144 mg/dL — ABNORMAL HIGH (ref 65–99)
POTASSIUM: 3.8 mmol/L (ref 3.5–5.1)
Sodium: 136 mmol/L (ref 135–145)
TOTAL PROTEIN: 5.7 g/dL — AB (ref 6.5–8.1)
Total Bilirubin: 0.5 mg/dL (ref 0.3–1.2)

## 2016-05-19 LAB — LACTIC ACID, PLASMA: Lactic Acid, Venous: 2.6 mmol/L (ref 0.5–1.9)

## 2016-05-19 LAB — TROPONIN I

## 2016-05-19 LAB — LIPASE, BLOOD: LIPASE: 23 U/L (ref 11–51)

## 2016-05-19 MED ORDER — PIPERACILLIN-TAZOBACTAM 3.375 G IVPB 30 MIN
3.3750 g | Freq: Once | INTRAVENOUS | Status: AC
  Administered 2016-05-19: 3.375 g via INTRAVENOUS
  Filled 2016-05-19: qty 50

## 2016-05-19 MED ORDER — MORPHINE SULFATE (PF) 4 MG/ML IV SOLN
4.0000 mg | Freq: Once | INTRAVENOUS | Status: AC
  Administered 2016-05-19: 4 mg via INTRAVENOUS
  Filled 2016-05-19: qty 1

## 2016-05-19 MED ORDER — SODIUM CHLORIDE 0.9 % IV BOLUS (SEPSIS)
500.0000 mL | Freq: Once | INTRAVENOUS | Status: AC
  Administered 2016-05-19: 500 mL via INTRAVENOUS

## 2016-05-19 MED ORDER — SODIUM CHLORIDE 0.9 % IV BOLUS (SEPSIS): 1000.0000 mL | Freq: Once | INTRAVENOUS | Status: DC

## 2016-05-19 MED ORDER — SODIUM CHLORIDE 0.9 % IV BOLUS (SEPSIS)
1000.0000 mL | Freq: Once | INTRAVENOUS | Status: AC
  Administered 2016-05-19: 1000 mL via INTRAVENOUS

## 2016-05-19 MED ORDER — IOPAMIDOL (ISOVUE-300) INJECTION 61%
30.0000 mL | Freq: Once | INTRAVENOUS | Status: AC | PRN
  Administered 2016-05-19: 30 mL via ORAL

## 2016-05-19 MED ORDER — ONDANSETRON HCL 4 MG/2ML IJ SOLN
4.0000 mg | Freq: Once | INTRAMUSCULAR | Status: AC
  Administered 2016-05-19: 4 mg via INTRAVENOUS
  Filled 2016-05-19: qty 2

## 2016-05-19 MED ORDER — IOPAMIDOL (ISOVUE-300) INJECTION 61%
100.0000 mL | Freq: Once | INTRAVENOUS | Status: AC | PRN
  Administered 2016-05-19: 100 mL via INTRAVENOUS

## 2016-05-19 MED ORDER — VANCOMYCIN HCL IN DEXTROSE 1-5 GM/200ML-% IV SOLN
1000.0000 mg | Freq: Once | INTRAVENOUS | Status: AC
  Administered 2016-05-19: 1000 mg via INTRAVENOUS
  Filled 2016-05-19: qty 200

## 2016-05-19 NOTE — ED Provider Notes (Signed)
[facility] Emergency Department Provider Note  ____________________________________________   I have reviewed the triage vital signs and the nursing notes.   HISTORY  Chief Complaint Abdominal Pain   History limited by: Not Limited   HPI Benjamin FIGEROA Sr. is a 70 y.o. male who presents to the emergency department today via EMS because of concerns for abdominal pain. Abdominal pain has been increasing throughout the day. He states that he has been passing gas and has had some relief with that. Per EMS patient did have an episode of hypoxia on room air. Patient denied any chest pain. No measured fevers. Patient denies any abdominal surgeries in the past.   Past Medical History:  Diagnosis Date  . Arthritis   . Blood clot associated with vein wall inflammation   . BPH (benign prostatic hypertrophy)   . Cancer ([place])   . Cervical pain (neck)   . GERD (gastroesophageal reflux disease)   . Gout   . Headache   . Heart murmur   . Hemorrhoids   . Hypertension   . Insomnia   . Nocturia associated with benign prostatic hypertrophy   . Numbness and tingling of right arm   . PONV (postoperative nausea and vomiting)    Problems tiliting and turning head  . Shortness of breath dyspnea    with exertion  . Squamous cell skin cancer    right ankle  . Stenosis of cervical spine with myelopathy   . Vertigo     Patient Active Problem List   Diagnosis Date Noted  . Cervical spondylosis with myelopathy 05/09/2016  . Malnutrition of moderate degree 01/16/2016  . Weakness 01/14/2016  . Headache 01/14/2016  . Spinal cord compression ([place]) 09/14/2015  . Paresthesia of arm 09/14/2015  . Hyponatremia 09/14/2015  . Essential hypertension 09/14/2015  . Skin cancer 09/14/2015  . Debility 09/14/2015  . Right arm weakness 09/14/2015  . Nonspecific abnormal electrocardiogram (ECG) (EKG) 09/14/2015  . Bradycardia 09/14/2015    Past Surgical History:  Procedure  Laterality Date  . COLONOSCOPY WITH PROPOFOL N/A 02/19/2016   Procedure: COLONOSCOPY WITH PROPOFOL;  Surgeon: Manya Silvas, MD;  Location: [facility] ENDOSCOPY;  Service: Endoscopy;  Laterality: N/A;  . ESOPHAGOGASTRODUODENOSCOPY (EGD) WITH PROPOFOL  02/19/2016   Procedure: ESOPHAGOGASTRODUODENOSCOPY (EGD) WITH PROPOFOL;  Surgeon: Manya Silvas, MD;  Location: [facility] ENDOSCOPY;  Service: Endoscopy;;  . LESION REMOVAL Right 10/26/2015   Procedure: EXCISION RIGHT ANKLE SKIN ;  Surgeon: Wallace Going, DO;  Location: [facility] [place];  Service: Plastics;  Laterality: Right;  . POSTERIOR CERVICAL FUSION/FORAMINOTOMY N/A 05/09/2016   Procedure: CERVICAL THREE-FOUR, CERVICAL FOUR-FIVE, CERIVCAL FIVE-SIX LAMINECTOMY WITH LATERAL MASS FUSION;  Surgeon: Consuella Lose, MD;  Location: [facility]URO [facility]S;  Service: Neurosurgery;  Laterality: N/A;  LAMINECTOMY, LATERAL MASS FUSION C3-C6  . SKIN GRAFT Right 11/08/2015   Procedure: IRRIGATION AND DEBRIDEMENT AND SPLIT THICKNESS SKIN GRAFT RIGHT ANKLE ;  Surgeon: Wallace Going, DO;  Location: [facility];  Service: Plastics;  Laterality: Right;    Prior to Admission medications   Medication Sig Start Date End Date Taking? Authorizing Provider  diazepam (VALIUM) 5 MG tablet Take 1 tablet (5 mg total) by mouth every 8 (eight) hours. 05/15/16   Consuella Lose, MD  diclofenac (VOLTAREN) 75 MG EC tablet Take 75 mg by mouth 2 (two) times daily.    Historical Provider, MD  feeding supplement, ENSURE ENLIVE, (ENSURE ENLIVE) LIQD Take 237 mLs by mouth 2 (two) times  daily between meals. 09/16/15   [name], MD  fluticasone (FLONASE) 50 MCG/ACT nasal spray Place 2 sprays into both nostrils daily as needed for allergies or rhinitis.     Historical Provider, MD  gabapentin (NEURONTIN) 300 MG capsule Take 1 capsule (300 mg total) by mouth 3 (three) times daily. 05/15/16   Consuella Lose, MD  LINZESS 290 MCG CAPS capsule Take 290 mcg by  mouth daily.    Historical Provider, MD  lisinopril (PRINIVIL,ZESTRIL) 20 MG tablet Take 1 tablet (20 mg total) by mouth daily. 01/16/16   Henreitta Leber, MD  omeprazole (PRILOSEC) 40 MG capsule Take 40 mg by mouth daily.    Historical Provider, MD  ondansetron (ZOFRAN ODT) 4 MG disintegrating tablet Take 1 tablet (4 mg total) by mouth every 8 (eight) hours as needed for nausea or vomiting. 12/03/15   Eula Listen, MD  oxyCODONE-acetaminophen (PERCOCET/ROXICET) 5-325 MG tablet Take 1-2 tablets by mouth every 8 (eight) hours as needed for severe pain. 05/15/16   Consuella Lose, MD  predniSONE (STERAPRED UNI-PAK 21 TAB) 10 MG (21) TBPK tablet Take 1 tablet (10 mg total) by mouth taper from 4 doses each day to 1 dose and stop. 05/15/16   Consuella Lose, MD  tamsulosin (FLOMAX) 0.4 MG CAPS capsule Take 1 capsule (0.4 mg total) by mouth daily. 01/16/16   Henreitta Leber, MD    Allergies Codeine  Family History  Problem Relation Age of Onset  . CAD Father     Social History Social History  Substance Use Topics  . Smoking status: Never Smoker  . Smokeless tobacco: Never Used  . Alcohol use No     Comment: rare beer    Review of Systems  Constitutional: Negative for fever. Cardiovascular: Negative for chest pain. Respiratory: Negative for shortness of breath. Gastrointestinal: Positive for abdominal pain Genitourinary: Negative for dysuria. Musculoskeletal: Negative for back pain. Skin: Negative for rash. Neurological: Negative for headaches, focal weakness or numbness.   10-point ROS otherwise negative.  ____________________________________________   PHYSICAL EXAM:  VITAL SIGNS: ED Triage Vitals  Enc Vitals Group     BP 05/19/16 2027 (!) 137/104     Pulse Rate 05/19/16 2027 (!) 124     Resp 05/19/16 2027 18     Temp 05/19/16 2027 98.8 F (37.1 C)     Temp Source 05/19/16 2027 Axillary     SpO2 05/19/16 2027 94 %     Weight 05/19/16 2029 175 lb (79.4 kg)      Height 05/19/16 2029 5\' 8"  (1.727 m)     Head Circumference --      Peak Flow --      Pain Score 05/19/16 2029 7   Constitutional: Alert and oriented. Well appearing and in no distress. Eyes: Conjunctivae are normal. Normal extraocular movements. ENT   Head: Normocephalic and atraumatic.   Nose: No congestion/rhinnorhea.   Mouth/Throat: Mucous membranes are moist.   Neck: No stridor. Hematological/Lymphatic/Immunilogical: No cervical lymphadenopathy. Cardiovascular:Tachycardic, regular rhythm.  No murmurs, rubs, or gallops. Respiratory: Normal respiratory effort without tachypnea nor retractions. Breath sounds are clear and equal bilaterally. No wheezes/rales/rhonchi. Gastrointestinal: Soft. Tender to the palpation somewhat diffusely in the lower abdomen. Somewhat worse in the right lower quadrant Genitourinary: Deferred Musculoskeletal: Normal range of motion in all extremities. No lower extremity edema. Neurologic:  Normal speech and language. No gross focal neurologic deficits are appreciated.  Skin:  Skin is warm, dry and intact. No rash noted. Psychiatric: Mood and affect  are normal. Speech and behavior are normal. Patient exhibits appropriate insight and judgment.  ____________________________________________    LABS (pertinent positives/negatives)  Labs Reviewed  COMPREHENSIVE METABOLIC PANEL - Abnormal; Notable for the following:       Result Value   Glucose, Bld 144 (*)    BUN 35 (*)    Total Protein 5.7 (*)    Albumin 2.9 (*)    All other components within normal limits  CBC - Abnormal; Notable for the following:    WBC 15.5 (*)    RBC 4.26 (*)    Hemoglobin 12.4 (*)    HCT 37.8 (*)    All other components within normal limits  URINALYSIS COMPLETEWITH MICROSCOPIC ([facility] ONLY) - Abnormal; Notable for the following:    Color, Urine YELLOW (*)    APPearance CLOUDY (*)    Specific Gravity, Urine 1.034 (*)    Protein, ur 30 (*)    Squamous Epithelial /  LPF 0-5 (*)    All other components within normal limits  LACTIC ACID, PLASMA - Abnormal; Notable for the following:    Lactic Acid, Venous 2.6 (*)    All other components within normal limits  CULTURE, BLOOD (ROUTINE X 2)  CULTURE, BLOOD (ROUTINE X 2)  URINE CULTURE  LIPASE, BLOOD  TROPONIN I  LACTIC ACID, PLASMA     ____________________________________________   EKG  I, Nance Pear, attending physician, personally viewed and interpreted this EKG  EKG Time: 2029 Rate: 138 Rhythm: sinus tachycardia Axis: normal Intervals: qtc 447 QRS: narrow, q waves III, aVF ST changes: no st elevation Impression: abnormal ekg  ____________________________________________    RADIOLOGY  CXR  IMPRESSION:  Stable cardiomegaly and bibasilar atelectasis.    Gas distended bowel incompletely imaged, recommend dedicated chest  radiograph.    CT abd.pel Pending  ____________________________________________   PROCEDURES  Procedures  ____________________________________________   INITIAL IMPRESSION / ASSESSMENT AND PLAN / ED COURSE  Pertinent labs & imaging results that were available during my care of the patient were reviewed by me and considered in my medical decision making (see chart for details).  Patient presented to the emergency department today because of concerns for abdominal pain. On exam patient was tender slightly more in the right lower quadrant. Patient was tachycardic. Lactate and white blood cell counts were elevated. Patient was called code sepsis. He was given multiple broad-spectrum antibiotics and IV fluids. CT scan was ordered.  Clinical Course   Patient's tachycardia has improved with IV fluids. Awaiting CT scan read at time of sign out. ____________________________________________   FINAL CLINICAL IMPRESSION(S) / ED DIAGNOSES  Abdominal pain Sepsis  Note: This dictation was prepared with Dragon dictation. Any transcriptional errors that  result from this process are unintentional    Nance Pear, MD 05/20/16 0045

## 2016-05-19 NOTE — ED Notes (Signed)
Fall bracelet placed on patient. Family at bedside. Family and patient deny need for anything at this time.

## 2016-05-19 NOTE — ED Triage Notes (Signed)
Per EMS: Pt c/o abdominal pain, reports some relief while passing gas. Pt from [facility]. Pt's O2 sat 86% on RA, pt placed on 6L [place], O2 increased to 98%. Pt had recent C3-C5 fusion and in C-collar

## 2016-05-19 NOTE — ED Notes (Signed)
Pt asked if he had ever taken morphine. Pt reports that he has, and denies any allergy

## 2016-05-20 ENCOUNTER — Inpatient Hospital Stay: Payer: Medicare HMO

## 2016-05-20 DIAGNOSIS — M109 Gout, unspecified: Secondary | ICD-10-CM | POA: Diagnosis present

## 2016-05-20 DIAGNOSIS — D72829 Elevated white blood cell count, unspecified: Secondary | ICD-10-CM | POA: Diagnosis present

## 2016-05-20 DIAGNOSIS — R0682 Tachypnea, not elsewhere classified: Secondary | ICD-10-CM | POA: Diagnosis present

## 2016-05-20 DIAGNOSIS — T380X5A Adverse effect of glucocorticoids and synthetic analogues, initial encounter: Secondary | ICD-10-CM | POA: Diagnosis present

## 2016-05-20 DIAGNOSIS — Z886 Allergy status to analgesic agent status: Secondary | ICD-10-CM | POA: Diagnosis not present

## 2016-05-20 DIAGNOSIS — R011 Cardiac murmur, unspecified: Secondary | ICD-10-CM | POA: Diagnosis present

## 2016-05-20 DIAGNOSIS — R140 Abdominal distension (gaseous): Secondary | ICD-10-CM | POA: Diagnosis present

## 2016-05-20 DIAGNOSIS — A419 Sepsis, unspecified organism: Secondary | ICD-10-CM | POA: Diagnosis present

## 2016-05-20 DIAGNOSIS — Z981 Arthrodesis status: Secondary | ICD-10-CM | POA: Diagnosis not present

## 2016-05-20 DIAGNOSIS — I480 Paroxysmal atrial fibrillation: Secondary | ICD-10-CM | POA: Diagnosis present

## 2016-05-20 DIAGNOSIS — Z7951 Long term (current) use of inhaled steroids: Secondary | ICD-10-CM | POA: Diagnosis not present

## 2016-05-20 DIAGNOSIS — E860 Dehydration: Secondary | ICD-10-CM | POA: Diagnosis present

## 2016-05-20 DIAGNOSIS — Z8249 Family history of ischemic heart disease and other diseases of the circulatory system: Secondary | ICD-10-CM | POA: Diagnosis not present

## 2016-05-20 DIAGNOSIS — Z79899 Other long term (current) drug therapy: Secondary | ICD-10-CM | POA: Diagnosis not present

## 2016-05-20 DIAGNOSIS — Z85828 Personal history of other malignant neoplasm of skin: Secondary | ICD-10-CM | POA: Diagnosis not present

## 2016-05-20 DIAGNOSIS — Z9889 Other specified postprocedural states: Secondary | ICD-10-CM | POA: Diagnosis not present

## 2016-05-20 DIAGNOSIS — Z23 Encounter for immunization: Secondary | ICD-10-CM | POA: Diagnosis not present

## 2016-05-20 DIAGNOSIS — K5900 Constipation, unspecified: Secondary | ICD-10-CM | POA: Diagnosis present

## 2016-05-20 DIAGNOSIS — M1990 Unspecified osteoarthritis, unspecified site: Secondary | ICD-10-CM | POA: Diagnosis present

## 2016-05-20 DIAGNOSIS — K219 Gastro-esophageal reflux disease without esophagitis: Secondary | ICD-10-CM | POA: Diagnosis present

## 2016-05-20 DIAGNOSIS — I10 Essential (primary) hypertension: Secondary | ICD-10-CM | POA: Diagnosis present

## 2016-05-20 DIAGNOSIS — R000 Tachycardia, unspecified: Secondary | ICD-10-CM | POA: Diagnosis present

## 2016-05-20 LAB — C DIFFICILE QUICK SCREEN W PCR REFLEX
C Diff antigen: NEGATIVE
C Diff interpretation: NOT DETECTED
C Diff toxin: NEGATIVE

## 2016-05-20 LAB — LACTIC ACID, PLASMA: Lactic Acid, Venous: 1.6 mmol/L (ref 0.5–1.9)

## 2016-05-20 LAB — TSH: TSH: 0.604 u[IU]/mL (ref 0.350–4.500)

## 2016-05-20 MED ORDER — SCOPOLAMINE 1 MG/3DAYS TD PT72: 1.0000 | MEDICATED_PATCH | TRANSDERMAL | Status: DC

## 2016-05-20 MED ORDER — OXYCODONE-ACETAMINOPHEN 5-325 MG PO TABS
1.0000 | ORAL_TABLET | Freq: Three times a day (TID) | ORAL | Status: DC | PRN
  Administered 2016-05-20 – 2016-05-21 (×3): 1 via ORAL
  Filled 2016-05-20 (×3): qty 1

## 2016-05-20 MED ORDER — PANTOPRAZOLE SODIUM 40 MG PO TBEC
40.0000 mg | DELAYED_RELEASE_TABLET | Freq: Every day | ORAL | Status: DC
  Administered 2016-05-20 – 2016-05-21 (×2): 40 mg via ORAL
  Filled 2016-05-20 (×2): qty 1

## 2016-05-20 MED ORDER — METOCLOPRAMIDE HCL 5 MG/ML IJ SOLN
10.0000 mg | Freq: Four times a day (QID) | INTRAMUSCULAR | Status: DC
  Administered 2016-05-20 – 2016-05-21 (×4): 10 mg via INTRAVENOUS
  Filled 2016-05-20 (×5): qty 2

## 2016-05-20 MED ORDER — SIMETHICONE 80 MG PO CHEW
80.0000 mg | CHEWABLE_TABLET | Freq: Four times a day (QID) | ORAL | Status: DC
  Administered 2016-05-20 – 2016-05-21 (×4): 80 mg via ORAL
  Filled 2016-05-20 (×5): qty 1

## 2016-05-20 MED ORDER — FUROSEMIDE 10 MG/ML IJ SOLN
40.0000 mg | Freq: Once | INTRAMUSCULAR | Status: AC
  Administered 2016-05-20: 40 mg via INTRAVENOUS
  Filled 2016-05-20: qty 4

## 2016-05-20 MED ORDER — ENOXAPARIN SODIUM 40 MG/0.4ML ~~LOC~~ SOLN
40.0000 mg | SUBCUTANEOUS | Status: DC
  Administered 2016-05-20: 40 mg via SUBCUTANEOUS
  Filled 2016-05-20: qty 0.4

## 2016-05-20 MED ORDER — VANCOMYCIN HCL 10 G IV SOLR
1250.0000 mg | Freq: Two times a day (BID) | INTRAVENOUS | Status: DC
  Administered 2016-05-20: 1250 mg via INTRAVENOUS
  Filled 2016-05-20 (×2): qty 1250

## 2016-05-20 MED ORDER — METHYLNALTREXONE BROMIDE 12 MG/0.6ML ~~LOC~~ SOLN
12.0000 mg | Freq: Once | SUBCUTANEOUS | Status: AC
Start: 2016-05-20 — End: 2016-05-20
  Administered 2016-05-20: 12 mg via SUBCUTANEOUS
  Filled 2016-05-20: qty 0.6

## 2016-05-20 MED ORDER — FLUTICASONE PROPIONATE 50 MCG/ACT NA SUSP
2.0000 | Freq: Every day | NASAL | Status: DC | PRN
  Filled 2016-05-20: qty 16

## 2016-05-20 MED ORDER — LISINOPRIL 20 MG PO TABS
20.0000 mg | ORAL_TABLET | Freq: Every day | ORAL | Status: DC
  Administered 2016-05-20 – 2016-05-21 (×2): 20 mg via ORAL
  Filled 2016-05-20 (×2): qty 1

## 2016-05-20 MED ORDER — INFLUENZA VAC SPLIT QUAD 0.5 ML IM SUSY
0.5000 mL | PREFILLED_SYRINGE | INTRAMUSCULAR | Status: AC
  Administered 2016-05-21: 0.5 mL via INTRAMUSCULAR
  Filled 2016-05-20: qty 0.5

## 2016-05-20 MED ORDER — DIAZEPAM 5 MG PO TABS
5.0000 mg | ORAL_TABLET | Freq: Three times a day (TID) | ORAL | Status: DC
  Administered 2016-05-20 – 2016-05-21 (×4): 5 mg via ORAL
  Filled 2016-05-20 (×4): qty 1

## 2016-05-20 MED ORDER — ONDANSETRON HCL 4 MG/2ML IJ SOLN: 4.0000 mg | Freq: Four times a day (QID) | INTRAMUSCULAR | Status: DC | PRN

## 2016-05-20 MED ORDER — ACETAMINOPHEN 325 MG PO TABS: 650.0000 mg | ORAL_TABLET | Freq: Four times a day (QID) | ORAL | Status: DC | PRN

## 2016-05-20 MED ORDER — ONDANSETRON HCL 4 MG PO TABS: 4.0000 mg | ORAL_TABLET | Freq: Four times a day (QID) | ORAL | Status: DC | PRN

## 2016-05-20 MED ORDER — SODIUM CHLORIDE 0.9 % IV SOLN
INTRAVENOUS | Status: DC
Start: 2016-05-20 — End: 2016-05-20
  Administered 2016-05-20: 08:00:00 via INTRAVENOUS

## 2016-05-20 MED ORDER — PIPERACILLIN-TAZOBACTAM 3.375 G IVPB
3.3750 g | Freq: Three times a day (TID) | INTRAVENOUS | Status: DC
  Administered 2016-05-20: 3.375 g via INTRAVENOUS
  Filled 2016-05-20 (×2): qty 50

## 2016-05-20 MED ORDER — DILTIAZEM HCL 30 MG PO TABS
60.0000 mg | ORAL_TABLET | Freq: Three times a day (TID) | ORAL | Status: DC
  Administered 2016-05-20 – 2016-05-21 (×3): 60 mg via ORAL
  Filled 2016-05-20 (×3): qty 2

## 2016-05-20 MED ORDER — DOCUSATE SODIUM 100 MG PO CAPS
100.0000 mg | ORAL_CAPSULE | Freq: Two times a day (BID) | ORAL | Status: DC
  Administered 2016-05-20 – 2016-05-21 (×2): 100 mg via ORAL
  Filled 2016-05-20 (×2): qty 1

## 2016-05-20 MED ORDER — DICLOFENAC SODIUM 75 MG PO TBEC
75.0000 mg | DELAYED_RELEASE_TABLET | Freq: Two times a day (BID) | ORAL | Status: DC
  Administered 2016-05-20 – 2016-05-21 (×3): 75 mg via ORAL
  Filled 2016-05-20 (×3): qty 1

## 2016-05-20 MED ORDER — GABAPENTIN 300 MG PO CAPS
300.0000 mg | ORAL_CAPSULE | Freq: Three times a day (TID) | ORAL | Status: DC
  Administered 2016-05-20 – 2016-05-21 (×4): 300 mg via ORAL
  Filled 2016-05-20 (×4): qty 1

## 2016-05-20 MED ORDER — ACETAMINOPHEN 650 MG RE SUPP
650.0000 mg | Freq: Four times a day (QID) | RECTAL | Status: DC | PRN
Start: 2016-05-20 — End: 2016-05-21

## 2016-05-20 MED ORDER — FLEET ENEMA 7-19 GM/118ML RE ENEM
1.0000 | ENEMA | Freq: Once | RECTAL | Status: AC
  Administered 2016-05-20: 1 via RECTAL

## 2016-05-20 MED ORDER — TAMSULOSIN HCL 0.4 MG PO CAPS
0.4000 mg | ORAL_CAPSULE | Freq: Every day | ORAL | Status: DC
  Administered 2016-05-20 – 2016-05-21 (×2): 0.4 mg via ORAL
  Filled 2016-05-20 (×2): qty 1

## 2016-05-20 MED ORDER — PREDNISONE 10 MG PO TABS
10.0000 mg | ORAL_TABLET | Freq: Every day | ORAL | Status: DC
  Administered 2016-05-20 – 2016-05-21 (×2): 10 mg via ORAL
  Filled 2016-05-20 (×2): qty 1

## 2016-05-20 NOTE — Progress Notes (Signed)
MD notified of pt C/O shortness of breath. Sats checked and pt was 86% on R.A. 2L of O2 were applied and sats came up to 98-100%. PT's heart rhythm on tele was showing AFIB and sustaining in the 100's. Awaiting orders from MD.

## 2016-05-20 NOTE — ED Provider Notes (Signed)
-----------------------------------------   1:08 AM on 05/20/2016 -----------------------------------------   Blood pressure (!) 135/101, pulse 85, temperature 98.8 F (37.1 C), temperature source Axillary, resp. rate (!) 24, height 5\' 8"  (1.727 m), weight 175 lb (79.4 kg), SpO2 98 %.  Assuming care from Dr. Archie Balboa.  In short, Benjamin STILLER Sr. is a 70 y.o. male with a chief complaint of Abdominal Pain .  Refer to the original H&P for additional details.  The current plan of care is to follow up the CT and disposition the patient.   CT abd and pelvis:  Distended stomach. Decompressed small large bowel, moderate amount  of retained large bowel stool.    3 mm nonobstructing LEFT nephrolithiasis.    The patient's CT scan shows a distended stomach with some decompressed small bowel in moderate stool. The patient's heart rate did improve with his hydration which was given previously. I did contact the admitting hospitalist who initially felt that the patient needed a CT of his cervical spine with for possible infection. I spoke to the radiologist who said that the patient had a ready received an IV dose of contrast and could not receive any more at this time. He recommends that if we are concerned about abscess or infection that the patient should receive an MRI or have it tapped by neurosurgery. I did discuss this with the patient but he's had no fevers nor has he had any neck pain. His symptoms were mainly abdominal pain and diarrhea. Given the symptoms I feel that the patient needs to be admitted for dehydration as well as his abdominal pain and diarrhea. I recontacted the hospitalist who did accept the patient to their service.   Loney Hering, MD 05/20/16 [phone]

## 2016-05-20 NOTE — H&P (Signed)
Benjamin Mayer Sr. is an 70 y.o. male.   Chief Complaint: Abdominal pain HPI: The patient with past medical history significant for cervical stenosis status post surgical repair 7 days ago presents emergency department complaining of abdominal pain and diarrhea. The patient states that he's been having loose stools since going to rehabilitation following his surgery. Notably, he was also prescribed Linzess postoperatively. In the emergency department the patient displayed signs and symptoms of sepsis. He denied chest pain or shortness of breath. CT of the abdomen revealed constipation with evidence of ileus which prompted emergency department staff to call for admission.  Past Medical History:  Diagnosis Date  . Arthritis   . Blood clot associated with vein wall inflammation   . BPH (benign prostatic hypertrophy)   . Cancer ([place])   . Cervical pain (neck)   . GERD (gastroesophageal reflux disease)   . Gout   . Headache   . Heart murmur   . Hemorrhoids   . Hypertension   . Insomnia   . Nocturia associated with benign prostatic hypertrophy   . Numbness and tingling of right arm   . PONV (postoperative nausea and vomiting)    Problems tiliting and turning head  . Shortness of breath dyspnea    with exertion  . Squamous cell skin cancer    right ankle  . Stenosis of cervical spine with myelopathy   . Vertigo     Past Surgical History:  Procedure Laterality Date  . COLONOSCOPY WITH PROPOFOL N/A 02/19/2016   Procedure: COLONOSCOPY WITH PROPOFOL;  Surgeon: Manya Silvas, MD;  Location: [facility] ENDOSCOPY;  Service: Endoscopy;  Laterality: N/A;  . ESOPHAGOGASTRODUODENOSCOPY (EGD) WITH PROPOFOL  02/19/2016   Procedure: ESOPHAGOGASTRODUODENOSCOPY (EGD) WITH PROPOFOL;  Surgeon: Manya Silvas, MD;  Location: [facility] ENDOSCOPY;  Service: Endoscopy;;  . LESION REMOVAL Right 10/26/2015   Procedure: EXCISION RIGHT ANKLE SKIN ;  Surgeon: Wallace Going, DO;  Location: [place];   Service: Plastics;  Laterality: Right;  . POSTERIOR CERVICAL FUSION/FORAMINOTOMY N/A 05/09/2016   Procedure: CERVICAL THREE-FOUR, CERVICAL FOUR-FIVE, CERIVCAL FIVE-SIX LAMINECTOMY WITH LATERAL MASS FUSION;  Surgeon: Consuella Lose, MD;  Location: [place] NEURO ORS;  Service: Neurosurgery;  Laterality: N/A;  LAMINECTOMY, LATERAL MASS FUSION C3-C6  . SKIN GRAFT Right 11/08/2015   Procedure: IRRIGATION AND DEBRIDEMENT AND SPLIT THICKNESS SKIN GRAFT RIGHT ANKLE ;  Surgeon: Wallace Going, DO;  Location: [place];  Service: Plastics;  Laterality: Right;    Family History  Problem Relation Age of Onset  . CAD Father    Social History:  reports that he has never smoked. He has never used smokeless tobacco. He reports that he does not drink alcohol or use drugs.  Allergies:  Allergies  Allergen Reactions  . Codeine Nausea And Vomiting    Medications Prior to Admission  Medication Sig Dispense Refill  . diazepam (VALIUM) 5 MG tablet Take 1 tablet (5 mg total) by mouth every 8 (eight) hours. 30 tablet 0  . diclofenac (VOLTAREN) 75 MG EC tablet Take 75 mg by mouth 2 (two) times daily.    . fluticasone (FLONASE) 50 MCG/ACT nasal spray Place 2 sprays into both nostrils daily as needed for allergies or rhinitis.     Marland Kitchen gabapentin (NEURONTIN) 300 MG capsule Take 1 capsule (300 mg total) by mouth 3 (three) times daily. 90 capsule 0  . LINZESS 290 MCG CAPS capsule Take 290 mcg by mouth daily.    Marland Kitchen lisinopril (PRINIVIL,ZESTRIL) 20  MG tablet Take 1 tablet (20 mg total) by mouth daily. 60 tablet 1  . omeprazole (PRILOSEC) 40 MG capsule Take 40 mg by mouth daily.    . ondansetron (ZOFRAN ODT) 4 MG disintegrating tablet Take 1 tablet (4 mg total) by mouth every 8 (eight) hours as needed for nausea or vomiting. 10 tablet 0  . oxyCODONE-acetaminophen (PERCOCET/ROXICET) 5-325 MG tablet Take 1-2 tablets by mouth every 8 (eight) hours as needed for severe pain. 30 tablet 0  . predniSONE  (DELTASONE) 10 MG tablet Take 10 mg by mouth daily with breakfast.    . scopolamine (TRANSDERM-SCOP) 1 MG/3DAYS Place 1 patch onto the skin every 3 (three) days.    . tamsulosin (FLOMAX) 0.4 MG CAPS capsule Take 1 capsule (0.4 mg total) by mouth daily. 30 capsule 1    Results for orders placed or performed during the hospital encounter of 05/19/16 (from the past 48 hour(s))  Lipase, blood     Status: None   Collection Time: 05/19/16  8:34 PM  Result Value Ref Range   Lipase 23 11 - 51 U/L  Comprehensive metabolic panel     Status: Abnormal   Collection Time: 05/19/16  8:34 PM  Result Value Ref Range   Sodium 136 135 - 145 mmol/L   Potassium 3.8 3.5 - 5.1 mmol/L   Chloride 104 101 - 111 mmol/L   CO2 27 22 - 32 mmol/L   Glucose, Bld 144 (H) 65 - 99 mg/dL   BUN 35 (H) 6 - 20 mg/dL   Creatinine, Ser 0.84 0.61 - 1.24 mg/dL   Calcium 9.3 8.9 - 10.3 mg/dL   Total Protein 5.7 (L) 6.5 - 8.1 g/dL   Albumin 2.9 (L) 3.5 - 5.0 g/dL   AST 21 15 - 41 U/L   ALT 24 17 - 63 U/L   Alkaline Phosphatase 59 38 - 126 U/L   Total Bilirubin 0.5 0.3 - 1.2 mg/dL   GFR calc non Af Amer >60 >60 mL/min   GFR calc Af Amer >60 >60 mL/min    Comment: (NOTE) The eGFR has been calculated using the CKD EPI equation. This calculation has not been validated in all clinical situations. eGFR's persistently <60 mL/min signify possible Chronic Kidney Disease.    Anion gap 5 5 - 15  CBC     Status: Abnormal   Collection Time: 05/19/16  8:34 PM  Result Value Ref Range   WBC 15.5 (H) 3.8 - 10.6 K/uL   RBC 4.26 (L) 4.40 - 5.90 MIL/uL   Hemoglobin 12.4 (L) 13.0 - 18.0 g/dL   HCT 37.8 (L) 40.0 - 52.0 %   MCV 88.8 80.0 - 100.0 fL   MCH 29.1 26.0 - 34.0 pg   MCHC 32.7 32.0 - 36.0 g/dL   RDW 14.2 11.5 - 14.5 %   Platelets 351 150 - 440 K/uL  Troponin I     Status: None   Collection Time: 05/19/16  8:34 PM  Result Value Ref Range   Troponin I <0.03 <0.03 ng/mL  Lactic acid, plasma     Status: Abnormal   Collection  Time: 05/19/16  9:19 PM  Result Value Ref Range   Lactic Acid, Venous 2.6 (HH) 0.5 - 1.9 mmol/L    Comment: CRITICAL RESULT CALLED TO, READ BACK BY AND VERIFIED WITH JENNA ELLINGTON AT 2212 05/19/16.PMH  Urinalysis complete, with microscopic     Status: Abnormal   Collection Time: 05/19/16 11:13 PM  Result Value Ref Range  Color, Urine YELLOW (A) YELLOW   APPearance CLOUDY (A) CLEAR   Glucose, UA NEGATIVE NEGATIVE mg/dL   Bilirubin Urine NEGATIVE NEGATIVE   Ketones, ur NEGATIVE NEGATIVE mg/dL   Specific Gravity, Urine 1.034 (H) 1.005 - 1.030   Hgb urine dipstick NEGATIVE NEGATIVE   pH 5.0 5.0 - 8.0   Protein, ur 30 (A) NEGATIVE mg/dL   Nitrite NEGATIVE NEGATIVE   Leukocytes, UA NEGATIVE NEGATIVE   RBC / HPF 6-30 0 - 5 RBC/hpf   WBC, UA 6-30 0 - 5 WBC/hpf   Bacteria, UA NONE SEEN NONE SEEN   Squamous Epithelial / LPF 0-5 (A) NONE SEEN   Mucous PRESENT    Ca Oxalate Crys, UA PRESENT   Lactic acid, plasma     Status: None   Collection Time: 05/20/16 12:29 AM  Result Value Ref Range   Lactic Acid, Venous 1.6 0.5 - 1.9 mmol/L   Ct Abdomen Pelvis W Contrast  Result Date: 05/20/2016 CLINICAL DATA:  Generalized abdominal pain improved with passing flatus. History of neck fusion 2 weeks ago. EXAM: CT ABDOMEN AND PELVIS WITH CONTRAST TECHNIQUE: Multidetector CT imaging of the abdomen and pelvis was performed using the standard protocol following bolus administration of intravenous contrast. CONTRAST:  145m ISOVUE-300 IOPAMIDOL (ISOVUE-300) INJECTION 61% COMPARISON:  CT abdomen and pelvis Jan 16, 2016 FINDINGS: LOWER CHEST: Small bilateral pleural effusions and bibasilar atelectasis. Heart size is normal. Mild coronary artery calcifications. No pericardial effusions. HEPATOBILIARY: Liver demonstrates punctate calcification LEFT lobe compatible with granuloma. The hyper enhancing lesions in the liver from Jan 16, 2016 are not apparent on this examination. Normal gallbladder. Nonobstructing LEFT  nephrolithiasis. PANCREAS: Normal. SPLEEN: Normal. ADRENALS/URINARY TRACT: Kidneys are orthotopic, demonstrating symmetric enhancement. 3 mm LEFT lower pole nephrolithiasis. LEFT renal cysts measure up to 2.4 cm. Too small to characterize hypodensities in the kidneys bilaterally. No hydronephrosis or solid renal masses. The unopacified ureters are normal in course and caliber. Delayed imaging through the kidneys demonstrates symmetric prompt contrast excretion within the proximal urinary collecting system. Urinary bladder is partially distended and unremarkable. Normal adrenal glands. STOMACH/BOWEL: Markedly gas and contrast distended stomach. The small and large bowel are normal in course and caliber without inflammatory changes. Mild descending and sigmoid colon diverticulosis. Moderate amount of retained large bowel stool. Normal appendix. VASCULAR/LYMPHATIC: Aortoiliac vessels are normal in course and caliber, moderate calcific atherosclerosis. No lymphadenopathy by CT size criteria. REPRODUCTIVE: Prostate size is normal. OTHER: No intraperitoneal free fluid or free air. MUSCULOSKELETAL: Nonacute. Large fat containing LEFT inguinal hernia. Degenerative change of lumbar spine resulting in severe bilateral L5-S1 neural foraminal narrowing, moderate to severe RIGHT L3-4 and bilateral L4-5 neural foraminal narrowing. IMPRESSION: Distended stomach. Decompressed small large bowel, moderate amount of retained large bowel stool. 3 mm nonobstructing LEFT nephrolithiasis. Electronically Signed   By: CElon AlasM.D.   On: 05/20/2016 00:49   Dg Chest Port 1 View  Result Date: 05/20/2016 CLINICAL DATA:  Abdominal pain, hypoxia. EXAM: PORTABLE CHEST 1 VIEW COMPARISON:  Chest radiograph September 13, 2015 FINDINGS: The cardiac silhouette is mildly enlarged, even with consideration to this low inspiratory portable examination with crowded vascular markings. Bibasilar strandy densities. No pleural effusion or focal  consolidation. No pneumothorax. New cervical spine hardware. Multiple loops of gas distended bowel partially imaged. Osteopenia. IMPRESSION: Stable cardiomegaly and bibasilar atelectasis. Gas distended bowel incompletely imaged, recommend dedicated chest radiograph. Electronically Signed   By: CElon AlasM.D.   On: 05/20/2016 00:13    Review of Systems  Constitutional: Negative for chills and fever.  HENT: Negative for sore throat and tinnitus.   Eyes: Negative for blurred vision and redness.  Respiratory: Negative for cough and shortness of breath.   Cardiovascular: Negative for chest pain, palpitations, orthopnea and PND.  Gastrointestinal: Positive for abdominal pain and diarrhea. Negative for nausea and vomiting.  Genitourinary: Negative for dysuria, frequency and urgency.  Musculoskeletal: Negative for joint pain and myalgias.  Skin: Negative for rash.       No lesions  Neurological: Negative for speech change, focal weakness and weakness.  Endo/Heme/Allergies: Does not bruise/bleed easily.       No temperature intolerance  Psychiatric/Behavioral: Negative for depression and suicidal ideas.    Blood pressure (!) 134/92, pulse (!) 101, temperature 97.9 F (36.6 C), temperature source Oral, resp. rate 20, height 5' 8"  (1.727 m), weight 79.4 kg (175 lb), SpO2 94 %. Physical Exam  Constitutional: He is oriented to person, place, and time. He appears well-developed and well-nourished. No distress.  HENT:  Head: Normocephalic and atraumatic.  Mouth/Throat: Oropharynx is clear and moist.  Eyes: Conjunctivae and EOM are normal. Pupils are equal, round, and reactive to light. No scleral icterus.  Neck: Normal range of motion. Neck supple. No JVD present. No tracheal deviation present. No thyromegaly present.  Cardiovascular: Normal rate, regular rhythm and normal heart sounds.  Exam reveals no gallop and no friction rub.   No murmur heard. Respiratory: Effort normal and breath  sounds normal.  GI: Soft. Bowel sounds are normal. He exhibits distension. He exhibits no mass. There is tenderness. There is no rebound and no guarding.  Genitourinary:  Genitourinary Comments: Deferred  Musculoskeletal: Normal range of motion. He exhibits no edema.  Lymphadenopathy:    He has no cervical adenopathy.  Neurological: He is alert and oriented to person, place, and time. No cranial nerve deficit.  Skin: Skin is warm and dry. No rash noted. No erythema.  Psychiatric: He has a normal mood and affect. His behavior is normal. Judgment and thought content normal.     Assessment/Plan This is a 70 year old male admitted for sepsis and abdominal pain. 1. Sepsis: The patient meets criteria via heart rate, tachypnea and leukocytosis. In this perioperative period one would suspect that he might have a wound infection. However, CT of the neck obtained shortly after surgery showed only what appeared to be a seroma as would be expected. The patient cannot undergo another CT of the neck with contrast at this time as he already received contrast for his abdominal CAT scan. We'll defer further imaging for later. Continue broad-spectrum antibiotics. Blood and urine cultures obtained. No evidence of pneumonia. Follow cultures for growth and sensitivities. The patient is hemodynamically stable. 2. Abdominal pain: Associated with diarrhea. The patient had been on oral antibiotics postoperatively. I place the patient on enteric precautions and we will check for Clostridium difficile. 3. Essential hypertension: Controlled; continue lisinopril 4. DVT prophylaxis: Lovenox 5. GI prophylaxis: None The patient is a full code. Time spent on admission orders and patient care approximately 45 minutes  Harrie Foreman, MD 05/20/2016, 7:37 AM

## 2016-05-20 NOTE — ED Notes (Signed)
Pt transported to room 210A

## 2016-05-20 NOTE — NC FL2 (Signed)
[place] MEDICAID FL2 LEVEL OF CARE SCREENING TOOL     IDENTIFICATION  Patient Name: Benjamin Turner Sr. Birthdate: 25-Sep-1945 Sex: male Admission Date (Current Location): 05/19/2016  [place] and [place] Number:  Engineering geologist and Address:  [facility], [address] [place], [place] [address]      Provider Number: [id number]  Attending Physician Name and Address:  Dustin Flock, MD  Relative Name and Phone Number:     [place], [place] [phone]  [phone]       Current Level of Care: Hospital Recommended Level of Care: [place] Prior Approval Number:    Date Approved/Denied:   PASRR Number:   [id number]  Discharge Plan: SNF    Current Diagnoses: Patient Active Problem List   Diagnosis Date Noted  . Sepsis ([place]) 05/20/2016  . Cervical spondylosis with myelopathy 05/09/2016  . Malnutrition of moderate degree 01/16/2016  . Weakness 01/14/2016  . Headache 01/14/2016  . Spinal cord compression ([place]) 09/14/2015  . Paresthesia of arm 09/14/2015  . Hyponatremia 09/14/2015  . Essential hypertension 09/14/2015  . Skin cancer 09/14/2015  . Debility 09/14/2015  . Right arm weakness 09/14/2015  . Nonspecific abnormal electrocardiogram (ECG) (EKG) 09/14/2015  . Bradycardia 09/14/2015    Orientation RESPIRATION BLADDER Height & Weight     Self  Normal Continent Weight: 175 lb (79.4 kg) Height:  5\' 8"  (172.7 cm)  BEHAVIORAL SYMPTOMS/MOOD NEUROLOGICAL BOWEL NUTRITION STATUS   (none)  (none) Continent Diet (currently clear liquid)  AMBULATORY STATUS COMMUNICATION OF NEEDS Skin   Extensive Assist Verbally Normal                       Personal Care Assistance Level of Assistance  Dressing, Bathing Bathing Assistance: Limited assistance Feeding assistance: Limited assistance Dressing Assistance: Limited assistance     Functional Limitations Info  Hearing   Hearing Info: Impaired      SPECIAL  CARE FACTORS FREQUENCY                       Contractures Contractures Info: Not present    Additional Factors Info  Code Status, Allergies Code Status Info: full Allergies Info: codeine           Current Medications (05/20/2016):  This is the current hospital active medication list Current Facility-Administered Medications  Medication Dose Route Frequency Provider Last Rate Last Dose  . 0.9 %  sodium chloride infusion   Intravenous Continuous Dustin Flock, MD 75 mL/hr at 05/20/16 1142    . acetaminophen (TYLENOL) tablet 650 mg  650 mg Oral Q6H PRN Harrie Foreman, MD       Or  . acetaminophen (TYLENOL) suppository 650 mg  650 mg Rectal Q6H PRN Harrie Foreman, MD      . diazepam (VALIUM) tablet 5 mg  5 mg Oral Q8H Harrie Foreman, MD   5 mg at 05/20/16 1446  . diclofenac (VOLTAREN) EC tablet 75 mg  75 mg Oral BID Harrie Foreman, MD   75 mg at 05/20/16 0931  . docusate sodium (COLACE) capsule 100 mg  100 mg Oral BID Harrie Foreman, MD      . enoxaparin (LOVENOX) injection 40 mg  40 mg Subcutaneous Q24H Harrie Foreman, MD      . fluticasone [facility]) 50 MCG/ACT nasal spray 2 spray  2 spray Each Nare Daily PRN Harrie Foreman, MD      .  gabapentin (NEURONTIN) capsule 300 mg  300 mg Oral TID Harrie Foreman, MD   300 mg at 05/20/16 0931  . [START ON 05/21/2016] Influenza vac split quadrivalent PF (FLUARIX) injection 0.5 mL  0.5 mL Intramuscular Tomorrow-1000 Dustin Flock, MD      . lisinopril (PRINIVIL,ZESTRIL) tablet 20 mg  20 mg Oral Daily Harrie Foreman, MD   20 mg at 05/20/16 0931  . metoCLOPramide (REGLAN) injection 10 mg  10 mg Intravenous Q6H Dustin Flock, MD   10 mg at 05/20/16 1153  . ondansetron (ZOFRAN) tablet 4 mg  4 mg Oral Q6H PRN Harrie Foreman, MD       Or  . ondansetron [facility]) injection 4 mg  4 mg Intravenous Q6H PRN Harrie Foreman, MD      . oxyCODONE-acetaminophen (PERCOCET/ROXICET) 5-325 MG per tablet 1-2 tablet  1-2 tablet  Oral Q8H PRN Harrie Foreman, MD   1 tablet at 05/20/16 [phone]  . pantoprazole (PROTONIX) EC tablet 40 mg  40 mg Oral Daily Harrie Foreman, MD   40 mg at 05/20/16 0931  . predniSONE (DELTASONE) tablet 10 mg  10 mg Oral Q breakfast Harrie Foreman, MD   10 mg at 05/20/16 [id number]  . [START ON 05/22/2016] scopolamine (TRANSDERM-SCOP) 1 MG/3DAYS 1.5 mg  1 patch Transdermal Q72H Harrie Foreman, MD      . simethicone [facility]) chewable tablet 80 mg  80 mg Oral QID Dustin Flock, MD      . tamsulosin [facility]) capsule 0.4 mg  0.4 mg Oral Daily Harrie Foreman, MD   0.4 mg at 05/20/16 [id number]     Discharge Medications: Please see discharge summary for a list of discharge medications.  Relevant Imaging Results:  Relevant Lab Results:   Additional Information    Shela Leff, LCSW

## 2016-05-20 NOTE — Progress Notes (Signed)
[place] at [facility]                                                                                                                                                                                            Patient Demographics   Benjamin Turner, is a 70 y.o. male, DOB - 01-13-1946, QB:[id number]  Admit date - 05/19/2016   Admitting Physician Harrie Foreman, MD  Outpatient Primary MD for the patient is Marguerita Merles, MD   LOS - 0  Subjective: Patient admitted with abdominal pain and was noted to have tachycardia and leukocytosis Patient recently had cervical surgery. He was in a nursing home. His abdominal pain is improved    Review of Systems:   CONSTITUTIONAL: No documented fever. No fatigue, weakness. No weight gain, no weight loss.  EYES: No blurry or double vision.  ENT: No tinnitus. No postnasal drip. No redness of the oropharynx.  RESPIRATORY: No cough, no wheeze, no hemoptysis. No dyspnea.  CARDIOVASCULAR: No chest pain. No orthopnea. No palpitations. No syncope.  GASTROINTESTINAL: No nausea, no vomiting or diarrhea. Positive abdominal pain. No melena or hematochezia.  GENITOURINARY: No dysuria or hematuria.  ENDOCRINE: No polyuria or nocturia. No heat or cold intolerance.  HEMATOLOGY: No anemia. No bruising. No bleeding.  INTEGUMENTARY: No rashes. No lesions.  MUSCULOSKELETAL: No arthritis. No swelling. No gout.  NEUROLOGIC: No numbness, tingling, or ataxia. No seizure-type activity.  PSYCHIATRIC: No anxiety. No insomnia. No ADD.    Vitals:   Vitals:   05/20/16 0400 05/20/16 0430 05/20/16 0500 05/20/16 0651  BP: 111/69 117/76 104/72 (!) 134/92  Pulse: (!) 103 (!) 110 80 (!) 101  Resp: 18 (!) 21 17 20   Temp:    97.9 F (36.6 C)  TempSrc:    Oral  SpO2: 93% 94% 95% 94%  Weight:      Height:        Wt Readings from Last 3 Encounters:  05/19/16 175 lb (79.4 kg)  05/09/16 176 lb (79.8 kg)  05/06/16 176 lb (79.8 kg)      Intake/Output Summary (Last 24 hours) at 05/20/16 1402 Last data filed at 05/20/16 1210  Gross per 24 hour  Intake                0 ml  Output              350 ml  Net             -350 ml    Physical Exam:   GENERAL: Pleasant-appearing in no apparent distress.  HEAD, EYES, EARS, NOSE AND THROAT:  Atraumatic, normocephalic. Extraocular muscles are intact. Pupils equal and reactive to light. Sclerae anicteric. No conjunctival injection. No oro-pharyngeal erythema.  NECK: Supple. There is no jugular venous distention. No bruits, no lymphadenopathy, no thyromegaly. Neck collar in place HEART: Regular rate and rhythm,. No murmurs, no rubs, no clicks.  LUNGS: Clear to auscultation bilaterally. No rales or rhonchi. No wheezes.  ABDOMEN: Soft, flat, nontender, nondistended. Has good bowel sounds. No hepatosplenomegaly appreciated.  EXTREMITIES: No evidence of any cyanosis, clubbing, or peripheral edema.  +2 pedal and radial pulses bilaterally.  NEUROLOGIC: The patient is alert, awake, and oriented x3 with no focal motor or sensory deficits appreciated bilaterally.  SKIN: Moist and warm with no rashes appreciated.  Psych: Not anxious, depressed LN: No inguinal LN enlargement    Antibiotics   Anti-infectives    Start     Dose/Rate Route Frequency Ordered Stop   05/20/16 0900  vancomycin (VANCOCIN) 1,250 mg in sodium chloride 0.9 % 250 mL IVPB  Status:  Discontinued     1,250 mg 166.7 mL/hr over 90 Minutes Intravenous Every 12 hours 05/20/16 0714 05/20/16 1316   05/20/16 0800  piperacillin-tazobactam (ZOSYN) IVPB 3.375 g  Status:  Discontinued     3.375 g 12.5 mL/hr over 240 Minutes Intravenous Every 8 hours 05/20/16 0714 05/20/16 1316   05/19/16 2230  vancomycin (VANCOCIN) IVPB 1000 mg/200 mL premix     1,000 mg 200 mL/hr over 60 Minutes Intravenous  Once 05/19/16 2225 05/19/16 2336   05/19/16 2115  piperacillin-tazobactam (ZOSYN) IVPB 3.375 g     3.375 g 100 mL/hr over 30 Minutes  Intravenous  Once 05/19/16 2112 05/19/16 2201      Medications   Scheduled Meds: . diazepam  5 mg Oral Q8H  . diclofenac  75 mg Oral BID  . docusate sodium  100 mg Oral BID  . enoxaparin (LOVENOX) injection  40 mg Subcutaneous Q24H  . gabapentin  300 mg Oral TID  . [START ON 05/21/2016] Influenza vac split quadrivalent PF  0.5 mL Intramuscular Tomorrow-1000  . lisinopril  20 mg Oral Daily  . metoCLOPramide (REGLAN) injection  10 mg Intravenous Q6H  . pantoprazole  40 mg Oral Daily  . predniSONE  10 mg Oral Q breakfast  . [START ON 05/22/2016] scopolamine  1 patch Transdermal Q72H  . simethicone  80 mg Oral QID  . tamsulosin  0.4 mg Oral Daily   Continuous Infusions: . sodium chloride 75 mL/hr at 05/20/16 1142   PRN Meds:.acetaminophen **OR** acetaminophen, fluticasone, ondansetron **OR** ondansetron (ZOFRAN) IV, oxyCODONE-acetaminophen   Data Review:   Micro Results Recent Results (from the past 240 hour(s))  C difficile quick scan w PCR reflex     Status: None   Collection Time: 05/20/16 12:15 PM  Result Value Ref Range Status   C Diff antigen NEGATIVE NEGATIVE Final   C Diff toxin NEGATIVE NEGATIVE Final   C Diff interpretation No C. difficile detected.  Final    Radiology Reports Dg Cervical Spine 1 View  Result Date: 05/09/2016 CLINICAL DATA:  C3-C6 lateral mass fusion and laminectomy EXAM: DG C-ARM 61-120 MIN; DG CERVICAL SPINE - 1 VIEW COMPARISON:  MR cervical spine 09/13/2015, cervical spine radiographs 09/07/2015 FLUOROSCOPY TIME:  0 minutes 8 seconds Images obtained: 1 FINDINGS: BILATERAL pedicle screws and posterior bars are identified at the mid to inferior cervical region. However, due to light technique, unable to establish bony landmarks or adequately visualize osseous structures. IMPRESSION: Nondiagnostic exam. Electronically Signed   By: Elta Guadeloupe  Thornton Papas M.D.   On: 05/09/2016 17:18   Dg Cervical Spine 2 Or 3 Views  Result Date: 05/11/2016 CLINICAL DATA:  S/p  cervical vertebral fusion 05/09/16; best obtainable images; pt had very limited ROM and was in a collar EXAM: CERVICAL SPINE - 2-3 VIEW COMPARISON:  Operative images, 05/09/2016 FINDINGS: Pedicle screws interconnecting rods extend from C3 through C6. Bilateral laminectomies with resection of spinous processes have been performed at these levels. The orthopedic hardware appears well-seated in well-positioned. Slight anterolisthesis noted of C4 on C5. IMPRESSION: 1. Status post posterior cervical spine fusion from C3 through C6. Orthopedic hardware is well positioned. Electronically Signed   By: Lajean Manes M.D.   On: 05/11/2016 09:27   Mr Cervical Spine Wo Contrast  Result Date: 05/11/2016 CLINICAL DATA:  Neck pain after posterior cervical fusion. Evaluate for epidural hematoma. EXAM: MRI CERVICAL SPINE WITHOUT CONTRAST TECHNIQUE: Multiplanar, multisequence MR imaging of the cervical spine was performed. No intravenous contrast was administered. COMPARISON:  Plain films 05/11/2016.  Preoperative MRI 09/13/2015. FINDINGS: Alignment: Mild straightening of the normal cervical lordosis, but no significant subluxation. 2 mm anterolisthesis C7-T1 is facet mediated. Vertebrae: No worrisome osseous findings. Cord: Mild stenosis at C2-C3 and at C6-C7. No definite cord flattening. No abnormal cord signal. Posterior Fossa, vertebral arteries, paraspinal tissues: Wide posterior decompression status post C3-C6 laminectomy. Abnormal dorsal fluid collections are isointense with CSF, and have 2 components. The larger component is directly dorsal to the dura, and measures 34 x 22 x 49 mm. A second collection, continuous with the first, dorsal to the C2 posterior elements, measures 16 x 23 x 20 mm. Associated air in the soft tissues is postoperative in nature. This fluid accumulation may represent simple postoperative seroma, but in the appropriate clinical setting, a CSF leak could have a similar appearance. Vertebral arteries  are patent. No tonsillar herniation. Disc levels: Multilevel spondylosis poorly evaluated in the setting of posterior hardware, with minor disc protrusions at multiple levels, similar to priors. IMPRESSION: No epidural hematoma or cord compression/cord injury at the site of previous posterior decompression and lateral mass C3-C6 fusion. Dorsal fluid collections, isointense with CSF, which in the appropriate clinical setting could represent a CSF leak, versus postoperative seroma. Mild residual stenosis at C2-3 and C6-7, similar to priors. Electronically Signed   By: Staci Righter M.D.   On: 05/11/2016 14:44   Ct Abdomen Pelvis W Contrast  Result Date: 05/20/2016 CLINICAL DATA:  Generalized abdominal pain improved with passing flatus. History of neck fusion 2 weeks ago. EXAM: CT ABDOMEN AND PELVIS WITH CONTRAST TECHNIQUE: Multidetector CT imaging of the abdomen and pelvis was performed using the standard protocol following bolus administration of intravenous contrast. CONTRAST:  144mL ISOVUE-300 IOPAMIDOL (ISOVUE-300) INJECTION 61% COMPARISON:  CT abdomen and pelvis Jan 16, 2016 FINDINGS: LOWER CHEST: Small bilateral pleural effusions and bibasilar atelectasis. Heart size is normal. Mild coronary artery calcifications. No pericardial effusions. HEPATOBILIARY: Liver demonstrates punctate calcification LEFT lobe compatible with granuloma. The hyper enhancing lesions in the liver from Jan 16, 2016 are not apparent on this examination. Normal gallbladder. Nonobstructing LEFT nephrolithiasis. PANCREAS: Normal. SPLEEN: Normal. ADRENALS/URINARY TRACT: Kidneys are orthotopic, demonstrating symmetric enhancement. 3 mm LEFT lower pole nephrolithiasis. LEFT renal cysts measure up to 2.4 cm. Too small to characterize hypodensities in the kidneys bilaterally. No hydronephrosis or solid renal masses. The unopacified ureters are normal in course and caliber. Delayed imaging through the kidneys demonstrates symmetric prompt  contrast excretion within the proximal urinary collecting system. Urinary bladder  is partially distended and unremarkable. Normal adrenal glands. STOMACH/BOWEL: Markedly gas and contrast distended stomach. The small and large bowel are normal in course and caliber without inflammatory changes. Mild descending and sigmoid colon diverticulosis. Moderate amount of retained large bowel stool. Normal appendix. VASCULAR/LYMPHATIC: Aortoiliac vessels are normal in course and caliber, moderate calcific atherosclerosis. No lymphadenopathy by CT size criteria. REPRODUCTIVE: Prostate size is normal. OTHER: No intraperitoneal free fluid or free air. MUSCULOSKELETAL: Nonacute. Large fat containing LEFT inguinal hernia. Degenerative change of lumbar spine resulting in severe bilateral L5-S1 neural foraminal narrowing, moderate to severe RIGHT L3-4 and bilateral L4-5 neural foraminal narrowing. IMPRESSION: Distended stomach. Decompressed small large bowel, moderate amount of retained large bowel stool. 3 mm nonobstructing LEFT nephrolithiasis. Electronically Signed   By: Elon Alas M.D.   On: 05/20/2016 00:49   Dg Chest Port 1 View  Result Date: 05/20/2016 CLINICAL DATA:  Abdominal pain, hypoxia. EXAM: PORTABLE CHEST 1 VIEW COMPARISON:  Chest radiograph September 13, 2015 FINDINGS: The cardiac silhouette is mildly enlarged, even with consideration to this low inspiratory portable examination with crowded vascular markings. Bibasilar strandy densities. No pleural effusion or focal consolidation. No pneumothorax. New cervical spine hardware. Multiple loops of gas distended bowel partially imaged. Osteopenia. IMPRESSION: Stable cardiomegaly and bibasilar atelectasis. Gas distended bowel incompletely imaged, recommend dedicated chest radiograph. Electronically Signed   By: Elon Alas M.D.   On: 05/20/2016 00:13   Dg C-arm 1-60 Min  Result Date: 05/09/2016 CLINICAL DATA:  C3-C6 lateral mass fusion and laminectomy  EXAM: DG C-ARM 61-120 MIN; DG CERVICAL SPINE - 1 VIEW COMPARISON:  MR cervical spine 09/13/2015, cervical spine radiographs 09/07/2015 FLUOROSCOPY TIME:  0 minutes 8 seconds Images obtained: 1 FINDINGS: BILATERAL pedicle screws and posterior bars are identified at the mid to inferior cervical region. However, due to light technique, unable to establish bony landmarks or adequately visualize osseous structures. IMPRESSION: Nondiagnostic exam. Electronically Signed   By: Lavonia Dana M.D.   On: 05/09/2016 17:18     CBC  Recent Labs Lab 05/19/16 2034  WBC 15.5*  HGB 12.4*  HCT 37.8*  PLT 351  MCV 88.8  MCH 29.1  MCHC 32.7  RDW 14.2    Chemistries   Recent Labs Lab 05/19/16 2034  NA 136  K 3.8  CL 104  CO2 27  GLUCOSE 144*  BUN 35*  CREATININE 0.84  CALCIUM 9.3  AST 21  ALT 24  ALKPHOS 59  BILITOT 0.5   ------------------------------------------------------------------------------------------------------------------ estimated creatinine clearance is 79.2 mL/min (by C-G formula based on SCr of 0.84 mg/dL). ------------------------------------------------------------------------------------------------------------------ No results for input(s): HGBA1C in the last 72 hours. ------------------------------------------------------------------------------------------------------------------ No results for input(s): CHOL, HDL, LDLCALC, TRIG, CHOLHDL, LDLDIRECT in the last 72 hours. ------------------------------------------------------------------------------------------------------------------  Recent Labs  05/19/16 2034  TSH 0.604   ------------------------------------------------------------------------------------------------------------------ No results for input(s): VITAMINB12, FOLATE, FERRITIN, TIBC, IRON, RETICCTPCT in the last 72 hours.  Coagulation profile No results for input(s): INR, PROTIME in the last 168 hours.  No results for input(s): DDIMER in the last 72  hours.  Cardiac Enzymes  Recent Labs Lab 05/19/16 2034  TROPONINI <0.03   ------------------------------------------------------------------------------------------------------------------ Invalid input(s): POCBNP    Assessment & Plan   This is a 70 year old male admitted for sepsis and abdominal pain. 1. Leukocytosis suspect due to prednisone therapy that he is on related to his back surgery, tachycardia likely due to his abdominal pain I do not see a source for his infection I will stop his antibiotics 2. Abdominal pain: Suspect  due to constipation with overflow of the stools causing him to have diarrhea at this point CT suggestive of severe constipation I will give him a dose of Relistor since he is on pain medications also he has some gastric distention will try some Gas-X 3. Essential hypertension: Controlled; continue lisinopril 4. DVT prophylaxis: Lovenox 5. GI prophylaxis: None        Code Status Orders        Start     Ordered   05/20/16 0659  Full code  Continuous     05/20/16 0659    Code Status History    Date Active Date Inactive Code Status Order ID Comments User Context   05/09/2016  6:47 PM 05/15/2016  9:00 PM Full Code [id number]  Consuella Lose, MD Inpatient   01/14/2016 10:32 PM 01/16/2016  7:57 PM Full Code [id number]  Lance Coon, MD Inpatient   09/14/2015 10:02 PM 09/16/2015  7:42 PM Full Code [id number]  Toy Baker, MD Inpatient           Consults  None   DVT Prophylaxis  Lovenox   Lab Results  Component Value Date   PLT 351 05/19/2016     Time Spent in minutes   45 minutes  Greater than 50% of time spent in care coordination and counseling patient regarding the condition and plan of care.   Dustin Flock M.D on 05/20/2016 at 2:02 PM  Between 7am to 6pm - Pager - [phone]  After 6pm go to [email or web address] - password [email or web address] [facility]   Office  [phone]

## 2016-05-20 NOTE — Progress Notes (Signed)
Pt arrived to room 210 via stretcher. Pt. A/Ox4, VSS. Cervical neck collar in place, neck pain 4/10. Denies abd discomfort at present. Oriented pt and wife to unit. Pt's condition stable.

## 2016-05-20 NOTE — Progress Notes (Signed)
IV Lasix and Cardizem given per MD order. On call MD paged regarding new onset of lethargy. Orders given for EKG. MD to assess pt.

## 2016-05-20 NOTE — Progress Notes (Signed)
Pharmacy Antibiotic Note  Benjamin HEMSLEY Sr. is a 70 y.o. male admitted on 05/19/2016 with sepsis.  Pharmacy has been consulted for vancomycin and Zosyn dosing.  Plan: DW 79.4kg  Vd 56kg   kei 0.07 hr-1  T1/2 10 hours Vancomycin 1250 mg q 12 hours ordered with stacked dosing. Level before 5th dose. Goal trough 15-20.  Zosyn 3.375 grams q 8 hours ordered.   Height: 5\' 8"  (172.7 cm) Weight: 175 lb (79.4 kg) IBW/kg (Calculated) : 68.4  Temp (24hrs), Avg:98.4 F (36.9 C), Min:97.9 F (36.6 C), Max:98.8 F (37.1 C)   Recent Labs Lab 05/19/16 2034 05/19/16 2119 05/20/16 0029  WBC 15.5*  --   --   CREATININE 0.84  --   --   LATICACIDVEN  --  2.6* 1.6    Estimated Creatinine Clearance: 79.2 mL/min (by C-G formula based on SCr of 0.84 mg/dL).    Allergies  Allergen Reactions  . Codeine Nausea And Vomiting    Antimicrobials this admission: vancomycin  >>  Zosyn  >>   Dose adjustments this admission:   Microbiology results: 9/24 BCx: pending 9/24 UCx: pending  9/11 MRSA (+)   9/24 CXR: bibasilar atelectasis 9/24 UA: LE(-) NO2(-) WBC 6-30.  Thank you for allowing pharmacy to be a part of this patient's care.  [name] S 05/20/2016 7:16 AM

## 2016-05-20 NOTE — Clinical Social Work Note (Signed)
Clinical Social Work Assessment  Patient Details  Name: Benjamin Turner. MRN: [id number] Date of Birth: 21-Jul-1946  Date of referral:  05/20/16               Reason for consult:  Facility Placement                Permission sought to share information with:   (patient sleeping throughout assessment) Permission granted to share information::     Name::        Agency::     Relationship::     Contact Information:     Housing/Transportation Living arrangements for the past 2 months:  [place], [place] of Information:  Spouse Patient Interpreter Needed:  None Criminal Activity/Legal Involvement Pertinent to Current Situation/Hospitalization:  No - Comment as needed Significant Relationships:  Spouse Lives with:  Spouse Do you feel safe going back to the place where you live?    Need for family participation in patient care:  Yes (Comment)  Care giving concerns:  Patient has been at [facility] for [facility].   Social Worker assessment / plan:  CSW attempted to meet with patient and his wife this afternoon however patient was sleeping heavily. CSW spoke with patient's wife and confirmed patient had been at [facility] for only 3 days and has been readmitted. Patient's wife wishes for him to return to [place] to complete his rehab once ready for discharge. Patient has medicare [employer]etna and will require reauthorization.   Employment status:  Retired Nurse, adult PT Recommendations:  [place] / Referral to community resources:  [place]  Patient/Family's Response to care:  Patient's wife expressed appreciation for CSW assistance.  Patient/Family's Understanding of and Emotional Response to Diagnosis, Current Treatment, and Prognosis:  Patient's wife did not voice any concerns and is hopeful he will be able to begin his rehab soon.   Emotional Assessment Appearance:   Appears stated age Attitude/Demeanor/Rapport:  Lethargic Affect (typically observed):  Unable to Assess Orientation:  Oriented to Self Alcohol / Substance use:  Not Applicable Psych involvement (Current and /or in the community):  No (Comment)  Discharge Needs  Concerns to be addressed:  Care Coordination Readmission within the last 30 days:  Yes Current discharge risk:  None Barriers to Discharge:  No Barriers Identified   Shela Leff, LCSW 05/20/2016, 3:16 PM

## 2016-05-21 ENCOUNTER — Inpatient Hospital Stay
Admit: 2016-05-21 | Discharge: 2016-05-21 | Disposition: A | Discharge status: Home / Self Care | Payer: Medicare HMO | Attending: Internal Medicine | Admitting: Internal Medicine

## 2016-05-21 LAB — HEMOGLOBIN A1C
Hgb A1c MFr Bld: 5.2 % (ref 4.8–5.6)
Mean Plasma Glucose: 103 mg/dL

## 2016-05-21 LAB — ECHOCARDIOGRAM COMPLETE
HEIGHTINCHES: 68 in
Weight: 2833.6 oz

## 2016-05-21 LAB — URINE CULTURE: CULTURE: NO GROWTH

## 2016-05-21 MED ORDER — OXYCODONE-ACETAMINOPHEN 5-325 MG PO TABS: 1.0000 | ORAL_TABLET | Freq: Three times a day (TID) | ORAL | 0 refills | Status: DC | PRN

## 2016-05-21 MED ORDER — DOCUSATE SODIUM 100 MG PO CAPS: 100.0000 mg | ORAL_CAPSULE | Freq: Two times a day (BID) | ORAL | 0 refills | Status: DC

## 2016-05-21 MED ORDER — SIMETHICONE 80 MG PO CHEW: 80.0000 mg | CHEWABLE_TABLET | Freq: Four times a day (QID) | ORAL | 0 refills | Status: DC | PRN

## 2016-05-21 MED ORDER — DIAZEPAM 5 MG PO TABS: 5.0000 mg | ORAL_TABLET | Freq: Three times a day (TID) | ORAL | 0 refills | Status: DC

## 2016-05-21 MED ORDER — DILTIAZEM HCL ER COATED BEADS 120 MG PO CP24: 120.0000 mg | ORAL_CAPSULE | Freq: Every day | ORAL | Status: DC

## 2016-05-21 MED ORDER — POLYETHYLENE GLYCOL 3350 17 G PO PACK: 17.0000 g | PACK | Freq: Every day | ORAL | 0 refills | Status: DC

## 2016-05-21 NOTE — Clinical Social Work Note (Addendum)
Patient to be d/c'ed today to [facility].  Patient and family agreeable to plans will transport via ems RN to call report to [phone].  Patient's wife was at bedside, she is aware that patient will be discharging today.  Evette Cristal, MSW Mon-Fri 8a-4:30p [phone]

## 2016-05-21 NOTE — Discharge Instructions (Signed)
°  DIET:  Cardiac diet  DISCHARGE CONDITION:  Stable  ACTIVITY:  Activity as tolerated  OXYGEN:  Home Oxygen: Yes.     Oxygen Delivery: 2 liters/min via Patient connected to nasal cannula oxygen  DISCHARGE LOCATION:  nursing home    ADDITIONAL DISCHARGE INSTRUCTION: Incentive spriometer every one hour while awake   If you experience worsening of your admission symptoms, develop shortness of breath, life threatening emergency, suicidal or homicidal thoughts you must seek medical attention immediately by calling 911 or calling your MD immediately  if symptoms less severe.  You Must read complete instructions/literature along with all the possible adverse reactions/side effects for all the Medicines you take and that have been prescribed to you. Take any new Medicines after you have completely understood and accpet all the possible adverse reactions/side effects.   Please note  You were cared for by a hospitalist during your hospital stay. If you have any questions about your discharge medications or the care you received while you were in the hospital after you are discharged, you can call the unit and asked to speak with the hospitalist on call if the hospitalist that took care of you is not available. Once you are discharged, your primary care physician will handle any further medical issues. Please note that NO REFILLS for any discharge medications will be authorized once you are discharged, as it is imperative that you return to your primary care physician (or establish a relationship with a primary care physician if you do not have one) for your aftercare needs so that they can reassess your need for medications and monitor your lab values.

## 2016-05-21 NOTE — Progress Notes (Signed)
Report called to [employer]. EMS will be called. Wife updated at bedside.

## 2016-05-21 NOTE — Consult Note (Signed)
[facility] Cardiology  CARDIOLOGY CONSULT NOTE  Patient ID: Benjamin Mayer Sr. MRN: [id number] DOB/AGE: 70-Sep-1947 70 y.o.  Admit date: 05/19/2016 Referring Physician [facility] Primary Physician  Primary Cardiologist  Reason for Consultation Paroxysmal atrial fibrillation  HPI: 70 year old gentleman referred for evaluation of paroxysmal atrial fibrillation. The patient is recently status post surgery for cervical stenosis and undergoing rehabilitation. The patient presents to [facility] emergency room with abdominal pain and ileus. During hospitalization, the patient's had intermittent episodes of tachycardia, telemetry revealing paroxysmal atrial fibrillation. The patient was started on Cardizem 60 mg by mouth every 6, currently is in sinus rhythm at a rate of 90 bpm. The patient is asymptomatic, denies chest pain, palpitations or heart racing.  Review of systems complete and found to be negative unless listed above     Past Medical History:  Diagnosis Date  . Arthritis   . Blood clot associated with vein wall inflammation   . BPH (benign prostatic hypertrophy)   . Cancer ([place])   . Cervical pain (neck)   . GERD (gastroesophageal reflux disease)   . Gout   . Headache   . Heart murmur   . Hemorrhoids   . Hypertension   . Insomnia   . Nocturia associated with benign prostatic hypertrophy   . Numbness and tingling of right arm   . PONV (postoperative nausea and vomiting)    Problems tiliting and turning head  . Shortness of breath dyspnea    with exertion  . Squamous cell skin cancer    right ankle  . Stenosis of cervical spine with myelopathy   . Vertigo     Past Surgical History:  Procedure Laterality Date  . COLONOSCOPY WITH PROPOFOL N/A 02/19/2016   Procedure: COLONOSCOPY WITH PROPOFOL;  Surgeon: Manya Silvas, MD;  Location[facility] ENDOSCOPY;  Service: Endoscopy;  Laterality: N/A;  . ESOPHAGOGASTRODUODENOSCOPY (EGD) WITH PROPOFOL  02/19/2016   Procedure: ESOPHAGOGASTRODUODENOSCOPY  (EGD) WITH PROPOFOL;  Surgeon: Manya Silvas, MD;  Location: [facility] ENDOSCOPY;  Service: Endoscopy;;  . LESION REMOVAL Right 10/26/2015   Procedure: EXCISION RIGHT ANKLE SKIN ;  Surgeon: Wallace Going, DO;  Location: [place];  Service: Plastics;  Laterality: Right;  . POSTERIOR CERVICAL FUSION/FORAMINOTOMY N/A 05/09/2016   Procedure: CERVICAL THREE-FOUR, CERVICAL FOUR-FIVE, CERIVCAL FIVE-SIX LAMINECTOMY WITH LATERAL MASS FUSION;  Surgeon: Consuella Lose, MD;  Location: [place] NEURO ORS;  Service: Neurosurgery;  Laterality: N/A;  LAMINECTOMY, LATERAL MASS FUSION C3-C6  . SKIN GRAFT Right 11/08/2015   Procedure: IRRIGATION AND DEBRIDEMENT AND SPLIT THICKNESS SKIN GRAFT RIGHT ANKLE ;  Surgeon: Wallace Going, DO;  Location: [place];  Service: Plastics;  Laterality: Right;    Prescriptions Prior to Admission  Medication Sig Dispense Refill Last Dose  . diazepam (VALIUM) 5 MG tablet Take 1 tablet (5 mg total) by mouth every 8 (eight) hours. 30 tablet 0 05/19/2016 at Unknown time  . diclofenac (VOLTAREN) 75 MG EC tablet Take 75 mg by mouth 2 (two) times daily.   05/19/2016 at Unknown time  . fluticasone (FLONASE) 50 MCG/ACT nasal spray Place 2 sprays into both nostrils daily as needed for allergies or rhinitis.    prn at prn  . gabapentin (NEURONTIN) 300 MG capsule Take 1 capsule (300 mg total) by mouth 3 (three) times daily. 90 capsule 0 05/19/2016 at Unknown time  . LINZESS 290 MCG CAPS capsule Take 290 mcg by mouth daily.   05/19/2016 at Unknown time  . lisinopril (PRINIVIL,ZESTRIL) 20 MG tablet  Take 1 tablet (20 mg total) by mouth daily. 60 tablet 1 05/19/2016 at Unknown time  . omeprazole (PRILOSEC) 40 MG capsule Take 40 mg by mouth daily.   05/19/2016 at Unknown time  . ondansetron (ZOFRAN ODT) 4 MG disintegrating tablet Take 1 tablet (4 mg total) by mouth every 8 (eight) hours as needed for nausea or vomiting. 10 tablet 0 prn at prn  . oxyCODONE-acetaminophen  (PERCOCET/ROXICET) 5-325 MG tablet Take 1-2 tablets by mouth every 8 (eight) hours as needed for severe pain. 30 tablet 0 prn at prn  . predniSONE (DELTASONE) 10 MG tablet Take 10 mg by mouth daily with breakfast.   unknown at unknown  . scopolamine (TRANSDERM-SCOP) 1 MG/3DAYS Place 1 patch onto the skin every 3 (three) days.   05/19/2016 at Unknown time  . tamsulosin (FLOMAX) 0.4 MG CAPS capsule Take 1 capsule (0.4 mg total) by mouth daily. 30 capsule 1 05/19/2016 at Unknown time   Social History   Social History  . Marital status: Married    Spouse name: N/A  . Number of children: N/A  . Years of education: N/A   Occupational History  . Not on file.   Social History Main Topics  . Smoking status: Never Smoker  . Smokeless tobacco: Never Used  . Alcohol use No     Comment: rare beer  . Drug use: No  . Sexual activity: Not on file   Other Topics Concern  . Not on file   Social History Narrative  . No narrative on file    Family History  Problem Relation Age of Onset  . CAD Father       Review of systems complete and found to be negative unless listed above      PHYSICAL EXAM  General: Well developed, well nourished, in no acute distress HEENT:  Normocephalic and atramatic Neck:  No JVD.  Lungs: Clear bilaterally to auscultation and percussion. Heart: HRRR . Normal S1 and S2 without gallops or murmurs.  Abdomen: Bowel sounds are positive, abdomen soft and non-tender  Msk:  Back normal, normal gait. Normal strength and tone for age. Extremities: No clubbing, cyanosis or edema.   Neuro: Alert and oriented X 3. Psych:  Good affect, responds appropriately  Labs:   Lab Results  Component Value Date   WBC 15.5 (H) 05/19/2016   HGB 12.4 (L) 05/19/2016   HCT 37.8 (L) 05/19/2016   MCV 88.8 05/19/2016   PLT 351 05/19/2016    Recent Labs Lab 05/19/16 2034  NA 136  K 3.8  CL 104  CO2 27  BUN 35*  CREATININE 0.84  CALCIUM 9.3  PROT 5.7*  BILITOT 0.5   ALKPHOS 59  ALT 24  AST 21  GLUCOSE 144*   Lab Results  Component Value Date   TROPONINI <0.03 05/19/2016   No results found for: CHOL No results found for: HDL No results found for: LDLCALC No results found for: TRIG No results found for: CHOLHDL No results found for: LDLDIRECT    Radiology: Dg Chest 1 View  Result Date: 05/20/2016 CLINICAL DATA:  Shortness of breath. EXAM: CHEST 1 VIEW COMPARISON:  Chest x-rays dated 05/19/2016 and 09/13/2015. FINDINGS: Study is hypoinspiratory. Given the low lung volumes, lungs are clear. No evidence of pneumonia or pulmonary edema. No pleural effusion or pneumothorax seen. Cardiomediastinal silhouette is stable. Osseous and soft tissue structures about the chest are unremarkable. IMPRESSION: Low lung volumes.  No active disease.  Stable mild cardiomegaly. Electronically Signed  By: Franki Cabot M.D.   On: 05/20/2016 18:11   Dg Cervical Spine 1 View  Result Date: 05/09/2016 CLINICAL DATA:  C3-C6 lateral mass fusion and laminectomy EXAM: DG C-ARM 61-120 MIN; DG CERVICAL SPINE - 1 VIEW COMPARISON:  MR cervical spine 09/13/2015, cervical spine radiographs 09/07/2015 FLUOROSCOPY TIME:  0 minutes 8 seconds Images obtained: 1 FINDINGS: BILATERAL pedicle screws and posterior bars are identified at the mid to inferior cervical region. However, due to light technique, unable to establish bony landmarks or adequately visualize osseous structures. IMPRESSION: Nondiagnostic exam. Electronically Signed   By: Lavonia Dana M.D.   On: 05/09/2016 17:18   Dg Cervical Spine 2 Or 3 Views  Result Date: 05/11/2016 CLINICAL DATA:  S/p cervical vertebral fusion 05/09/16; best obtainable images; pt had very limited ROM and was in a collar EXAM: CERVICAL SPINE - 2-3 VIEW COMPARISON:  Operative images, 05/09/2016 FINDINGS: Pedicle screws interconnecting rods extend from C3 through C6. Bilateral laminectomies with resection of spinous processes have been performed at these levels.  The orthopedic hardware appears well-seated in well-positioned. Slight anterolisthesis noted of C4 on C5. IMPRESSION: 1. Status post posterior cervical spine fusion from C3 through C6. Orthopedic hardware is well positioned. Electronically Signed   By: Lajean Manes M.D.   On: 05/11/2016 09:27   Mr Cervical Spine Wo Contrast  Result Date: 05/11/2016 CLINICAL DATA:  Neck pain after posterior cervical fusion. Evaluate for epidural hematoma. EXAM: MRI CERVICAL SPINE WITHOUT CONTRAST TECHNIQUE: Multiplanar, multisequence MR imaging of the cervical spine was performed. No intravenous contrast was administered. COMPARISON:  Plain films 05/11/2016.  Preoperative MRI 09/13/2015. FINDINGS: Alignment: Mild straightening of the normal cervical lordosis, but no significant subluxation. 2 mm anterolisthesis C7-T1 is facet mediated. Vertebrae: No worrisome osseous findings. Cord: Mild stenosis at C2-C3 and at C6-C7. No definite cord flattening. No abnormal cord signal. Posterior Fossa, vertebral arteries, paraspinal tissues: Wide posterior decompression status post C3-C6 laminectomy. Abnormal dorsal fluid collections are isointense with CSF, and have 2 components. The larger component is directly dorsal to the dura, and measures 34 x 22 x 49 mm. A second collection, continuous with the first, dorsal to the C2 posterior elements, measures 16 x 23 x 20 mm. Associated air in the soft tissues is postoperative in nature. This fluid accumulation may represent simple postoperative seroma, but in the appropriate clinical setting, a CSF leak could have a similar appearance. Vertebral arteries are patent. No tonsillar herniation. Disc levels: Multilevel spondylosis poorly evaluated in the setting of posterior hardware, with minor disc protrusions at multiple levels, similar to priors. IMPRESSION: No epidural hematoma or cord compression/cord injury at the site of previous posterior decompression and lateral mass C3-C6 fusion. Dorsal  fluid collections, isointense with CSF, which in the appropriate clinical setting could represent a CSF leak, versus postoperative seroma. Mild residual stenosis at C2-3 and C6-7, similar to priors. Electronically Signed   By: Staci Righter M.D.   On: 05/11/2016 14:44   Ct Abdomen Pelvis W Contrast  Result Date: 05/20/2016 CLINICAL DATA:  Generalized abdominal pain improved with passing flatus. History of neck fusion 2 weeks ago. EXAM: CT ABDOMEN AND PELVIS WITH CONTRAST TECHNIQUE: Multidetector CT imaging of the abdomen and pelvis was performed using the standard protocol following bolus administration of intravenous contrast. CONTRAST:  143mL ISOVUE-300 IOPAMIDOL (ISOVUE-300) INJECTION 61% COMPARISON:  CT abdomen and pelvis Jan 16, 2016 FINDINGS: LOWER CHEST: Small bilateral pleural effusions and bibasilar atelectasis. Heart size is normal. Mild coronary artery calcifications. No pericardial  effusions. HEPATOBILIARY: Liver demonstrates punctate calcification LEFT lobe compatible with granuloma. The hyper enhancing lesions in the liver from Jan 16, 2016 are not apparent on this examination. Normal gallbladder. Nonobstructing LEFT nephrolithiasis. PANCREAS: Normal. SPLEEN: Normal. ADRENALS/URINARY TRACT: Kidneys are orthotopic, demonstrating symmetric enhancement. 3 mm LEFT lower pole nephrolithiasis. LEFT renal cysts measure up to 2.4 cm. Too small to characterize hypodensities in the kidneys bilaterally. No hydronephrosis or solid renal masses. The unopacified ureters are normal in course and caliber. Delayed imaging through the kidneys demonstrates symmetric prompt contrast excretion within the proximal urinary collecting system. Urinary bladder is partially distended and unremarkable. Normal adrenal glands. STOMACH/BOWEL: Markedly gas and contrast distended stomach. The small and large bowel are normal in course and caliber without inflammatory changes. Mild descending and sigmoid colon diverticulosis.  Moderate amount of retained large bowel stool. Normal appendix. VASCULAR/LYMPHATIC: Aortoiliac vessels are normal in course and caliber, moderate calcific atherosclerosis. No lymphadenopathy by CT size criteria. REPRODUCTIVE: Prostate size is normal. OTHER: No intraperitoneal free fluid or free air. MUSCULOSKELETAL: Nonacute. Large fat containing LEFT inguinal hernia. Degenerative change of lumbar spine resulting in severe bilateral L5-S1 neural foraminal narrowing, moderate to severe RIGHT L3-4 and bilateral L4-5 neural foraminal narrowing. IMPRESSION: Distended stomach. Decompressed small large bowel, moderate amount of retained large bowel stool. 3 mm nonobstructing LEFT nephrolithiasis. Electronically Signed   By: Elon Alas M.D.   On: 05/20/2016 00:49   Dg Chest Port 1 View  Result Date: 05/20/2016 CLINICAL DATA:  Abdominal pain, hypoxia. EXAM: PORTABLE CHEST 1 VIEW COMPARISON:  Chest radiograph September 13, 2015 FINDINGS: The cardiac silhouette is mildly enlarged, even with consideration to this low inspiratory portable examination with crowded vascular markings. Bibasilar strandy densities. No pleural effusion or focal consolidation. No pneumothorax. New cervical spine hardware. Multiple loops of gas distended bowel partially imaged. Osteopenia. IMPRESSION: Stable cardiomegaly and bibasilar atelectasis. Gas distended bowel incompletely imaged, recommend dedicated chest radiograph. Electronically Signed   By: Elon Alas M.D.   On: 05/20/2016 00:13   Dg C-arm 1-60 Min  Result Date: 05/09/2016 CLINICAL DATA:  C3-C6 lateral mass fusion and laminectomy EXAM: DG C-ARM 61-120 MIN; DG CERVICAL SPINE - 1 VIEW COMPARISON:  MR cervical spine 09/13/2015, cervical spine radiographs 09/07/2015 FLUOROSCOPY TIME:  0 minutes 8 seconds Images obtained: 1 FINDINGS: BILATERAL pedicle screws and posterior bars are identified at the mid to inferior cervical region. However, due to light technique, unable to  establish bony landmarks or adequately visualize osseous structures. IMPRESSION: Nondiagnostic exam. Electronically Signed   By: Lavonia Dana M.D.   On: 05/09/2016 17:18    EKG: Normal sinus rhythm  ASSESSMENT AND PLAN:    1. Paroxysmal atrial fibrillation, likely due to a reversible event, in the setting of abdominal pain and ileus, currently in sinus rhythm  Recommendations  1. Agree with current therapy 2. Continue Cardizem for rate and rhythm control 3. Defer chronic anticoagulation 4. Review 2-D echocardiogram  Signed: Harsha Yusko MD,PhD, [facility] 05/21/2016, 8:21 AM

## 2016-05-21 NOTE — Discharge Summary (Signed)
Benjamin CARRAZCO Sr., 70 y.o., DOB September 01, 1945, MRN [id number]. Admission date: 05/19/2016 Discharge Date 05/21/2016 Primary MD Marguerita Merles, MD Admitting Physician Harrie Foreman, MD  Admission Diagnosis  Dehydration [E86.0] Generalized abdominal pain [R10.84] Sepsis ([place]) [A41.9] Diarrhea, unspecified type [R19.7]  Discharge Diagnosis   Active Problems:   Sepsis ([place]) rulled out   Abdominal pain- due to constipation and gas   Paroxysmal atrial fibrillation   Shortness of breath no evidence of CHF   hypertension Gout GERD        Hospital Course  The patient with past medical history significant for cervical stenosis status post surgical repair 7 days ago presents emergency department complaining of abdominal pain and diarrhea. Patient was seen in the ER and was noted to have tachycardia and leukocytosis. Therefore was admitted for sepsis however there was no source identified. Patient did have abdominal pain and had a CT scan which showed gaseous distention of the abdomen and constipation. Patient was treated for his constipation and gas. With resolution of his abdominal pain. In terms of leukocytosis he is on prednisone after his neck surgery likely the cause. No source of infection is identified. He was given IV fluids he started to complain of shortness of breath therefore they were discontinued had a chest x-ray which showed no pneumonia he received a dose of Lasix with resolution of his shortness of breath. He does need to remain on oxygen until able to wean off also recommend incentive spirometry. If he experiences any fevers or signs of infection he needs to be evaluated at the original facility where his next surgery was done since we don't have neurosurgery here.           Consults   cardiology Significant Tests:  See full reports for all details     Dg Chest 1 View  Result Date: 05/20/2016 CLINICAL DATA:  Shortness of breath. EXAM: CHEST 1 VIEW COMPARISON:  Chest  x-rays dated 05/19/2016 and 09/13/2015. FINDINGS: Study is hypoinspiratory. Given the low lung volumes, lungs are clear. No evidence of pneumonia or pulmonary edema. No pleural effusion or pneumothorax seen. Cardiomediastinal silhouette is stable. Osseous and soft tissue structures about the chest are unremarkable. IMPRESSION: Low lung volumes.  No active disease.  Stable mild cardiomegaly. Electronically Signed   By: Franki Cabot M.D.   On: 05/20/2016 18:11   Dg Cervical Spine 1 View  Result Date: 05/09/2016 CLINICAL DATA:  C3-C6 lateral mass fusion and laminectomy EXAM: DG C-ARM 61-120 MIN; DG CERVICAL SPINE - 1 VIEW COMPARISON:  MR cervical spine 09/13/2015, cervical spine radiographs 09/07/2015 FLUOROSCOPY TIME:  0 minutes 8 seconds Images obtained: 1 FINDINGS: BILATERAL pedicle screws and posterior bars are identified at the mid to inferior cervical region. However, due to light technique, unable to establish bony landmarks or adequately visualize osseous structures. IMPRESSION: Nondiagnostic exam. Electronically Signed   By: Lavonia Dana M.D.   On: 05/09/2016 17:18   Dg Cervical Spine 2 Or 3 Views  Result Date: 05/11/2016 CLINICAL DATA:  S/p cervical vertebral fusion 05/09/16; best obtainable images; pt had very limited ROM and was in a collar EXAM: CERVICAL SPINE - 2-3 VIEW COMPARISON:  Operative images, 05/09/2016 FINDINGS: Pedicle screws interconnecting rods extend from C3 through C6. Bilateral laminectomies with resection of spinous processes have been performed at these levels. The orthopedic hardware appears well-seated in well-positioned. Slight anterolisthesis noted of C4 on C5. IMPRESSION: 1. Status post posterior cervical spine fusion from C3 through C6. Orthopedic hardware is well  positioned. Electronically Signed   By: Lajean Manes M.D.   On: 05/11/2016 09:27   Mr Cervical Spine Wo Contrast  Result Date: 05/11/2016 CLINICAL DATA:  Neck pain after posterior cervical fusion. Evaluate for  epidural hematoma. EXAM: MRI CERVICAL SPINE WITHOUT CONTRAST TECHNIQUE: Multiplanar, multisequence MR imaging of the cervical spine was performed. No intravenous contrast was administered. COMPARISON:  Plain films 05/11/2016.  Preoperative MRI 09/13/2015. FINDINGS: Alignment: Mild straightening of the normal cervical lordosis, but no significant subluxation. 2 mm anterolisthesis C7-T1 is facet mediated. Vertebrae: No worrisome osseous findings. Cord: Mild stenosis at C2-C3 and at C6-C7. No definite cord flattening. No abnormal cord signal. Posterior Fossa, vertebral arteries, paraspinal tissues: Wide posterior decompression status post C3-C6 laminectomy. Abnormal dorsal fluid collections are isointense with CSF, and have 2 components. The larger component is directly dorsal to the dura, and measures 34 x 22 x 49 mm. A second collection, continuous with the first, dorsal to the C2 posterior elements, measures 16 x 23 x 20 mm. Associated air in the soft tissues is postoperative in nature. This fluid accumulation may represent simple postoperative seroma, but in the appropriate clinical setting, a CSF leak could have a similar appearance. Vertebral arteries are patent. No tonsillar herniation. Disc levels: Multilevel spondylosis poorly evaluated in the setting of posterior hardware, with minor disc protrusions at multiple levels, similar to priors. IMPRESSION: No epidural hematoma or cord compression/cord injury at the site of previous posterior decompression and lateral mass C3-C6 fusion. Dorsal fluid collections, isointense with CSF, which in the appropriate clinical setting could represent a CSF leak, versus postoperative seroma. Mild residual stenosis at C2-3 and C6-7, similar to priors. Electronically Signed   By: Staci Righter M.D.   On: 05/11/2016 14:44   Ct Abdomen Pelvis W Contrast  Result Date: 05/20/2016 CLINICAL DATA:  Generalized abdominal pain improved with passing flatus. History of neck fusion 2  weeks ago. EXAM: CT ABDOMEN AND PELVIS WITH CONTRAST TECHNIQUE: Multidetector CT imaging of the abdomen and pelvis was performed using the standard protocol following bolus administration of intravenous contrast. CONTRAST:  160mL ISOVUE-300 IOPAMIDOL (ISOVUE-300) INJECTION 61% COMPARISON:  CT abdomen and pelvis Jan 16, 2016 FINDINGS: LOWER CHEST: Small bilateral pleural effusions and bibasilar atelectasis. Heart size is normal. Mild coronary artery calcifications. No pericardial effusions. HEPATOBILIARY: Liver demonstrates punctate calcification LEFT lobe compatible with granuloma. The hyper enhancing lesions in the liver from Jan 16, 2016 are not apparent on this examination. Normal gallbladder. Nonobstructing LEFT nephrolithiasis. PANCREAS: Normal. SPLEEN: Normal. ADRENALS/URINARY TRACT: Kidneys are orthotopic, demonstrating symmetric enhancement. 3 mm LEFT lower pole nephrolithiasis. LEFT renal cysts measure up to 2.4 cm. Too small to characterize hypodensities in the kidneys bilaterally. No hydronephrosis or solid renal masses. The unopacified ureters are normal in course and caliber. Delayed imaging through the kidneys demonstrates symmetric prompt contrast excretion within the proximal urinary collecting system. Urinary bladder is partially distended and unremarkable. Normal adrenal glands. STOMACH/BOWEL: Markedly gas and contrast distended stomach. The small and large bowel are normal in course and caliber without inflammatory changes. Mild descending and sigmoid colon diverticulosis. Moderate amount of retained large bowel stool. Normal appendix. VASCULAR/LYMPHATIC: Aortoiliac vessels are normal in course and caliber, moderate calcific atherosclerosis. No lymphadenopathy by CT size criteria. REPRODUCTIVE: Prostate size is normal. OTHER: No intraperitoneal free fluid or free air. MUSCULOSKELETAL: Nonacute. Large fat containing LEFT inguinal hernia. Degenerative change of lumbar spine resulting in severe  bilateral L5-S1 neural foraminal narrowing, moderate to severe RIGHT L3-4 and bilateral L4-5 neural  foraminal narrowing. IMPRESSION: Distended stomach. Decompressed small large bowel, moderate amount of retained large bowel stool. 3 mm nonobstructing LEFT nephrolithiasis. Electronically Signed   By: Elon Alas M.D.   On: 05/20/2016 00:49   Dg Chest Port 1 View  Result Date: 05/20/2016 CLINICAL DATA:  Abdominal pain, hypoxia. EXAM: PORTABLE CHEST 1 VIEW COMPARISON:  Chest radiograph September 13, 2015 FINDINGS: The cardiac silhouette is mildly enlarged, even with consideration to this low inspiratory portable examination with crowded vascular markings. Bibasilar strandy densities. No pleural effusion or focal consolidation. No pneumothorax. New cervical spine hardware. Multiple loops of gas distended bowel partially imaged. Osteopenia. IMPRESSION: Stable cardiomegaly and bibasilar atelectasis. Gas distended bowel incompletely imaged, recommend dedicated chest radiograph. Electronically Signed   By: Elon Alas M.D.   On: 05/20/2016 00:13   Dg C-arm 1-60 Min  Result Date: 05/09/2016 CLINICAL DATA:  C3-C6 lateral mass fusion and laminectomy EXAM: DG C-ARM 61-120 MIN; DG CERVICAL SPINE - 1 VIEW COMPARISON:  MR cervical spine 09/13/2015, cervical spine radiographs 09/07/2015 FLUOROSCOPY TIME:  0 minutes 8 seconds Images obtained: 1 FINDINGS: BILATERAL pedicle screws and posterior bars are identified at the mid to inferior cervical region. However, due to light technique, unable to establish bony landmarks or adequately visualize osseous structures. IMPRESSION: Nondiagnostic exam. Electronically Signed   By: Lavonia Dana M.D.   On: 05/09/2016 17:18       Today   Subjective:   Benjamin Turner  feeling better denies any chest pain or shortness of breath  Objective:   Blood pressure 125/72, pulse 83, temperature 97.8 F (36.6 C), temperature source Oral, resp. rate 16, height 5\' 8"  (1.727 m),  weight 177 lb 1.6 oz (80.3 kg), SpO2 97 %.  .  Intake/Output Summary (Last 24 hours) at 05/21/16 0935 Last data filed at 05/21/16 0437  Gross per 24 hour  Intake             1778 ml  Output             4250 ml  Net            -2472 ml    Exam VITAL SIGNS: Blood pressure 125/72, pulse 83, temperature 97.8 F (36.6 C), temperature source Oral, resp. rate 16, height 5\' 8"  (1.727 m), weight 177 lb 1.6 oz (80.3 kg), SpO2 97 %.  GENERAL:  70 y.o.-year-old patient lying in the bed with no acute distress.  EYES: Pupils equal, round, reactive to light and accommodation. No scleral icterus. Extraocular muscles intact.  HEENT: Head atraumatic, normocephalic. Oropharynx and nasopharynx clear.  NECK:  Supple, no jugular venous distention. No thyroid enlargement, no tenderness.  LUNGS: Normal breath sounds bilaterally, no wheezing, rales,rhonchi or crepitation. No use of accessory muscles of respiration.  CARDIOVASCULAR: S1, S2 normal. No murmurs, rubs, or gallops.  ABDOMEN: Soft, nontender, nondistended. Bowel sounds present. No organomegaly or mass.  EXTREMITIES: No pedal edema, cyanosis, or clubbing.  NEUROLOGIC: Cranial nerves II through XII are intact. Muscle strength 5/5 in all extremities. Sensation intact. Gait not checked.  PSYCHIATRIC: The patient is alert and oriented x 3.  SKIN: No obvious rash, lesion, or ulcer.   Data Review     CBC w Diff: Lab Results  Component Value Date   WBC 15.5 (H) 05/19/2016   HGB 12.4 (L) 05/19/2016   HCT 37.8 (L) 05/19/2016   PLT 351 05/19/2016   LYMPHOPCT 22 01/14/2016   MONOPCT 9 01/14/2016   EOSPCT 3 01/14/2016   BASOPCT 1 01/14/2016  CMP: Lab Results  Component Value Date   NA 136 05/19/2016   K 3.8 05/19/2016   CL 104 05/19/2016   CO2 27 05/19/2016   BUN 35 (H) 05/19/2016   CREATININE 0.84 05/19/2016   PROT 5.7 (L) 05/19/2016   ALBUMIN 2.9 (L) 05/19/2016   BILITOT 0.5 05/19/2016   ALKPHOS 59 05/19/2016   AST 21 05/19/2016   ALT 24  05/19/2016  .  Micro Results Recent Results (from the past 240 hour(s))  Urine culture     Status: None   Collection Time: 05/19/16 11:13 PM  Result Value Ref Range Status   Specimen Description URINE, RANDOM  Final   Special Requests NONE  Final   Culture NO GROWTH Performed at [facility]   Final   Report Status 05/21/2016 FINAL  Final  C difficile quick scan w PCR reflex     Status: None   Collection Time: 05/20/16 12:15 PM  Result Value Ref Range Status   C Diff antigen NEGATIVE NEGATIVE Final   C Diff toxin NEGATIVE NEGATIVE Final   C Diff interpretation No C. difficile detected.  Final        Code Status Orders        Start     Ordered   05/20/16 0659  Full code  Continuous     05/20/16 0659    Code Status History    Date Active Date Inactive Code Status Order ID Comments User Context   05/20/2016  6:59 AM 05/21/2016  6:51 AM Full Code [id number]  Harrie Foreman, MD Inpatient   05/09/2016  6:47 PM 05/15/2016  9:00 PM Full Code [id number]  Consuella Lose, MD Inpatient   01/14/2016 10:32 PM 01/16/2016  7:57 PM Full Code [id number]  Lance Coon, MD Inpatient   09/14/2015 10:02 PM 09/16/2015  7:42 PM Full Code [id number]  Toy Baker, MD Inpatient          Follow-up Information    neurosurgon .   Why:  as was  previous scheduled          Discharge Medications     Medication List    TAKE these medications   diazepam 5 MG tablet Commonly known as:  VALIUM Take 1 tablet (5 mg total) by mouth every 8 (eight) hours.   diclofenac 75 MG EC tablet Commonly known as:  VOLTAREN Take 75 mg by mouth 2 (two) times daily.   diltiazem 120 MG 24 hr capsule Commonly known as:  CARDIZEM CD Take 1 capsule (120 mg total) by mouth daily.   docusate sodium 100 MG capsule Commonly known as:  COLACE Take 1 capsule (100 mg total) by mouth 2 (two) times daily.   fluticasone 50 MCG/ACT nasal spray Commonly known as:  FLONASE Place 2 sprays into both  nostrils daily as needed for allergies or rhinitis.   gabapentin 300 MG capsule Commonly known as:  NEURONTIN Take 1 capsule (300 mg total) by mouth 3 (three) times daily.   LINZESS 290 MCG Caps capsule Generic drug:  linaclotide Take 290 mcg by mouth daily.   lisinopril 20 MG tablet Commonly known as:  PRINIVIL,ZESTRIL Take 1 tablet (20 mg total) by mouth daily.   omeprazole 40 MG capsule Commonly known as:  PRILOSEC Take 40 mg by mouth daily.   ondansetron 4 MG disintegrating tablet Commonly known as:  ZOFRAN ODT Take 1 tablet (4 mg total) by mouth every 8 (eight) hours as needed for nausea or vomiting.   oxyCODONE-acetaminophen  5-325 MG tablet Commonly known as:  PERCOCET/ROXICET Take 1-2 tablets by mouth every 8 (eight) hours as needed for severe pain.   polyethylene glycol packet Commonly known as:  MIRALAX Take 17 g by mouth daily.   predniSONE 10 MG tablet Commonly known as:  DELTASONE Take 10 mg by mouth daily with breakfast.   scopolamine 1 MG/3DAYS Commonly known as:  TRANSDERM-SCOP Place 1 patch onto the skin every 3 (three) days.   simethicone 80 MG chewable tablet Commonly known as:  MYLICON Chew 1 tablet (80 mg total) by mouth 4 (four) times daily as needed for flatulence.   tamsulosin 0.4 MG Caps capsule Commonly known as:  FLOMAX Take 1 capsule (0.4 mg total) by mouth daily.          Total Time in preparing paper work, data evaluation and todays exam - 35 minutes  Dustin Flock M.D on 05/21/2016 at 9:35 AM  [place]  [phone]

## 2016-05-21 NOTE — Progress Notes (Signed)
  Echocardiogram 2D Echocardiogram has been performed.  Darlina Sicilian M 05/21/2016, 10:07 AM

## 2016-05-21 NOTE — Evaluation (Signed)
Physical Therapy Evaluation Patient Details Name: Benjamin VANDEVOORT Sr. MRN: [id number] DOB: 12/12/1945 Today's Date: 05/21/2016   History of Present Illness  Pt. 70 y.o. male who presented to ED with abdominal pain and ileus, admitted for abdominal pain, diarrhea, and dehydration. Pt. hx. of cervical fusion (05/09/16).  Clinical Impression  Pt. Supine in bed upon arrival, awake and alert. Prior to activity HR 106 spO2 91% Pt. Demonstrates some weakness B LE 4/5, B UE 4+/5 symmetrical except grip strength R<L. Pt. Demonstrates AROM deficits B UE <90 degrees shoulder flexion/abduction and significant thoracic kyphosis, cervical flexion and cervical AROM deficits unable to achieve neutral position from cervical flexion. Pt. Able to complete LE and cervical exercises with verbal cueing and sit at EOB from supine with CGA. Pt. Transfers Sit<>stand with use of RW min A with multiple attempts to stand. He was able ambulate approx. 3 feet from the EOB to chair min A and verbal cues with RW for B UE support, very slow step to pattern, small step length/height but no evidence of buckling or LOB. Pt. Also demonstrates decreased LE functional strength and endurance completing 3 stands in 30 seconds indicating that he at an increased risk of falls. With activity HR increased to 110-120's with a peak of 136, spO2 > 94%. Would benefit from skilled PT to address above deficits and promote optimal return to PLOF Recommend SNF placement upon d/c to follow up with further skilled PT needs.    Follow Up Recommendations SNF    Equipment Recommendations  Rolling walker with 5" wheels    Recommendations for Other Services       Precautions / Restrictions Precautions Precautions: Fall Required Braces or Orthoses: Cervical Brace (Pt. has cervical collar in room, not required to wear at this time but prefers for comfort at times) Restrictions Weight Bearing Restrictions: No      Mobility  Bed Mobility Overal bed  mobility: Needs Assistance Bed Mobility: Supine to Sit     Supine to sit: Min guard     General bed mobility comments: Pt able to initiate LE and trunk movements, use of handrail for B UE support and HOB elevated, cues for positioning at EOB.  Transfers Overall transfer level: Needs assistance Equipment used: Rolling walker (2 wheeled) Transfers: Sit to/from Stand Sit to Stand: Min assist         General transfer comment: Verbal cues for hand placement, pt. required multiple attempts before successful standing from EOB.   Ambulation/Gait Ambulation/Gait assistance: Min assist Ambulation Distance (Feet): 3 Feet Assistive device: Rolling walker (2 wheeled)       General Gait Details: Pt. demonstrates step to pattern, small step length/height B, requires use of RW for B UE support. min A and verbal cues for stepping and RW negotiation  Stairs            Wheelchair Mobility    Modified Rankin (Stroke Patients Only)       Balance Overall balance assessment: Needs assistance Sitting-balance support: Feet supported;No upper extremity supported Sitting balance-Leahy Scale: Fair     Standing balance support: Bilateral upper extremity supported Standing balance-Leahy Scale: Fair Standing balance comment: Pt. demonstrates increased lateral sway when performing standing marching task using RW for B UE support, pt. states " ohh my balance is bad"                  Standardized Balance Assessment Standardized Balance Assessment :  (Pt. performed 3 STS in 30 seconds. )  Pertinent Vitals/Pain Pain Assessment: No/denies pain (if i don't move it doesn't hurt) Pain Location: neck  Pain Intervention(s): Limited activity within patient's tolerance;Monitored during session    [place] expects to be discharged to:: Skilled nursing facility Living Arrangements: Spouse/significant other Available Help at Discharge: Family;Available 24  hours/day Type of Home: Mobile home Home Access: Stairs to enter Entrance Stairs-Rails: Right;Left;Can reach both Entrance Stairs-Number of Steps: 4 Home Layout: One level Home Equipment: Cane - single point      Prior Function Level of Independence: Independent with assistive device(s)   Gait / Transfers Assistance Needed: Pt. was previously not using AD, since cervical fusion has been using RW at rehab facility           Hand Dominance   Dominant Hand: Right    Extremity/Trunk Assessment   Upper Extremity Assessment: Overall WFL for tasks assessed;Generalized weakness (B UE strength grossly 4+/5 symmetrical. pt. reports his R arm feels weaker but testing did not reveal asymmetry. AROM limited <90 shoulder flex/abduction )           Lower Extremity Assessment: Overall WFL for tasks assessed (B LE Grossly 4/5 symmetrical )      Cervical / Trunk Assessment: Kyphotic  Communication   Communication: No difficulties  Cognition Arousal/Alertness: Awake/alert Behavior During Therapy: WFL for tasks assessed/performed Overall Cognitive Status: Within Functional Limits for tasks assessed                      General Comments      Exercises Other Exercises Other Exercises: Supine exercise in bed for LE x10; ankle pumps, heel slides, SLR, hip ab/add. sitting cervical AROM exercise x10 each flex/ext and rotation B. standing exercise x10 B marching with use RW for B UE support min A.    Assessment/Plan    PT Assessment Patient needs continued PT services  PT Problem List Decreased strength;Decreased range of motion;Decreased activity tolerance;Decreased balance;Decreased knowledge of use of DME;Decreased mobility          PT Treatment Interventions DME instruction;Gait training;Therapeutic exercise;Therapeutic activities;Patient/family education;Stair training;Functional mobility training;Balance training    PT Goals (Current goals can be found in the Care Plan  section)  Acute Rehab PT Goals Patient Stated Goal: Pt. would like to return to rehab  PT Goal Formulation: With patient Time For Goal Achievement: 06/04/16 Potential to Achieve Goals: Good    Frequency Min 2X/week   Barriers to discharge Inaccessible home environment      Co-evaluation               End of Session Equipment Utilized During Treatment: Gait belt Activity Tolerance: Patient limited by fatigue;Treatment limited secondary to medical complications (Comment) (Pt. HR increased to 136 with standing activity at bedside, pt. reports fatigue after exercises and transfer) Patient left: in chair;with chair alarm set;with call bell/phone within reach;with family/visitor present (wife present)           Time: [id number] PT Time Calculation (min) (ACUTE ONLY): 23 min   Charges:         PT G Codes:        Benjamin Turner, SPT  05/21/16,2:26 PM

## 2016-05-24 LAB — CULTURE, BLOOD (ROUTINE X 2)
Culture: NO GROWTH
Culture: NO GROWTH

## 2016-06-06 ENCOUNTER — Inpatient Hospital Stay (HOSPITAL_COMMUNITY)
Admission: EM | Admit: 2016-06-06 | Discharge: 2016-06-12 | DRG: 948 | Disposition: A | Discharge status: Skilled Nursing Facility | Payer: MEDICARE | Attending: Family Medicine | Admitting: Family Medicine

## 2016-06-06 ENCOUNTER — Encounter (HOSPITAL_COMMUNITY): Payer: Self-pay | Admitting: Emergency Medicine

## 2016-06-06 DIAGNOSIS — Z85828 Personal history of other malignant neoplasm of skin: Secondary | ICD-10-CM | POA: Diagnosis not present

## 2016-06-06 DIAGNOSIS — R531 Weakness: Secondary | ICD-10-CM | POA: Diagnosis not present

## 2016-06-06 DIAGNOSIS — N400 Enlarged prostate without lower urinary tract symptoms: Secondary | ICD-10-CM | POA: Diagnosis not present

## 2016-06-06 DIAGNOSIS — Z8249 Family history of ischemic heart disease and other diseases of the circulatory system: Secondary | ICD-10-CM

## 2016-06-06 DIAGNOSIS — W19XXXA Unspecified fall, initial encounter: Secondary | ICD-10-CM

## 2016-06-06 DIAGNOSIS — W050XXA Fall from non-moving wheelchair, initial encounter: Secondary | ICD-10-CM | POA: Diagnosis present

## 2016-06-06 DIAGNOSIS — S82434A Nondisplaced oblique fracture of shaft of right fibula, initial encounter for closed fracture: Secondary | ICD-10-CM | POA: Diagnosis not present

## 2016-06-06 DIAGNOSIS — K5900 Constipation, unspecified: Secondary | ICD-10-CM | POA: Diagnosis not present

## 2016-06-06 DIAGNOSIS — S82401A Unspecified fracture of shaft of right fibula, initial encounter for closed fracture: Secondary | ICD-10-CM

## 2016-06-06 DIAGNOSIS — K219 Gastro-esophageal reflux disease without esophagitis: Secondary | ICD-10-CM | POA: Diagnosis present

## 2016-06-06 DIAGNOSIS — M4712 Other spondylosis with myelopathy, cervical region: Secondary | ICD-10-CM

## 2016-06-06 DIAGNOSIS — Z885 Allergy status to narcotic agent status: Secondary | ICD-10-CM | POA: Diagnosis not present

## 2016-06-06 DIAGNOSIS — Y9389 Activity, other specified: Secondary | ICD-10-CM

## 2016-06-06 DIAGNOSIS — G9589 Other specified diseases of spinal cord: Secondary | ICD-10-CM | POA: Diagnosis not present

## 2016-06-06 DIAGNOSIS — M40204 Unspecified kyphosis, thoracic region: Secondary | ICD-10-CM | POA: Diagnosis not present

## 2016-06-06 DIAGNOSIS — M4802 Spinal stenosis, cervical region: Secondary | ICD-10-CM | POA: Diagnosis present

## 2016-06-06 DIAGNOSIS — R29898 Other symptoms and signs involving the musculoskeletal system: Secondary | ICD-10-CM | POA: Diagnosis present

## 2016-06-06 DIAGNOSIS — Y92009 Unspecified place in unspecified non-institutional (private) residence as the place of occurrence of the external cause: Secondary | ICD-10-CM | POA: Diagnosis not present

## 2016-06-06 DIAGNOSIS — G8918 Other acute postprocedural pain: Secondary | ICD-10-CM | POA: Diagnosis not present

## 2016-06-06 DIAGNOSIS — R0602 Shortness of breath: Secondary | ICD-10-CM

## 2016-06-06 DIAGNOSIS — I10 Essential (primary) hypertension: Secondary | ICD-10-CM | POA: Diagnosis not present

## 2016-06-06 DIAGNOSIS — Z981 Arthrodesis status: Secondary | ICD-10-CM

## 2016-06-06 NOTE — ED Triage Notes (Signed)
Per EMS: Pt had spinal surgery last month here at [facility]. Since then family has been reporting the Pt not being able to get around without complete assistance. Family also says he is speaking slower. Pt comes in today b/c family was trying to move him from a recliner to a wheelchair and the pt fell. Pt did not hit his head, but reports hitting rib on something when he fell. Pt is alert and oriented.   BP 148/65 HR 70 RR 20 SPO2 98% CBG 90

## 2016-06-06 NOTE — ED Provider Notes (Signed)
By signing my name below, I, Benjamin Turner, attest that this documentation has been prepared under the direction and in the presence of [place], DO . Electronically Signed: Higinio Turner, Scribe. 06/07/2016. 12:53 AM.  TIME SEEN: 11:54 PM   CHIEF COMPLAINT: Fall   HPI:   Benjamin SCHRAER Sr. is a 70 y.o. male with history of hypertension and cervical myelopathy status post recent laminectomy who presents to the Emergency Department accompanied by his family for an evaluation s/p a fall that occurred this evening. Pt's wife reports pt has been in rehabilitation until he left this morning. They noticed that he seemed weaker than normal on the car ride home. She states pt experienced weakness before his surgery but states it has worsened since then. She notes she was trying to move pt from his recliner into his wheelchair today when he suddenly fell. States he feels like his legs are giving out. He reports that before surgery he was able to ambulate and did not feel like his legs are weak but feels like an eschar and worsened surgery and acutely worse today. He now complains of worsening pain to his right arm, rib and ankle from the fall. He believes he struck his rib on something as he was falling. He denies hitting his head or loss of consciousness. Pt also states he is experiencing a sensation of "pins and needles" in his right arm; however, this began before his surgery on 05/09/16 and is unchanged. No new numbness or tingling. No bowel or bladder incontinence, urinary retention. Wife reports she does not feel like he can be taken care of at home. They state that they cannot get the wheelchair through the house. He denies chest pain, cough, vomiting, diarrhea, fever, cough and decreased appetite. Has been having normal bowel movements. Pt is not currently taking blood thinners.     Op Note 05/09/16:  PREOP DIAGNOSIS:  1. 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 C3-C6 laminectomy for decompression of spinal cord 2. Posterior segmental instrumentation with lateral mass screws, C3-C6 [employer]tronic 3. Posterolateral arthrodesis, C3-4, C4-5, C5-6 4. Use of non-structural bone allograft - Medtronic Magnifuse  SURGEON: Dr. Consuella Lose, MD  ROS: See HPI Constitutional: no fever  Eyes: no drainage  ENT: no runny nose   Cardiovascular:  no chest pain  Resp: no SOB  GI: no vomiting GU: no dysuria Integumentary: no rash  Allergy: no hives  Musculoskeletal: no leg swelling  Neurological: no slurred speech ROS otherwise negative  PAST MEDICAL HISTORY/PAST SURGICAL HISTORY:  Past Medical History:  Diagnosis Date  . Arthritis   . Blood clot associated with vein wall inflammation   . BPH (benign prostatic hypertrophy)   . Cancer ([place])   . Cervical pain (neck)   . GERD (gastroesophageal reflux disease)   . Gout   . Headache   . Heart murmur   . Hemorrhoids   . Hypertension   . Insomnia   . Nocturia associated with benign prostatic hypertrophy   . Numbness and tingling of right arm   . PONV (postoperative nausea and vomiting)    Problems tiliting and turning head  . Shortness of breath dyspnea    with exertion  . Squamous cell skin cancer    right ankle  . Stenosis of cervical spine with myelopathy   . Vertigo     MEDICATIONS:  Prior to Admission medications   Medication Sig Start Date End Date Taking? Authorizing  Provider  diazepam (VALIUM) 5 MG tablet Take 1 tablet (5 mg total) by mouth every 8 (eight) hours. 05/21/16   Dustin Flock, MD  diclofenac (VOLTAREN) 75 MG EC tablet Take 75 mg by mouth 2 (two) times daily.    Historical Provider, MD  diltiazem (CARDIZEM CD) 120 MG 24 hr capsule Take 1 capsule (120 mg total) by mouth daily. 05/21/16   Dustin Flock, MD  docusate sodium (COLACE) 100 MG capsule Take 1 capsule (100 mg total) by mouth 2 (two) times daily. 05/21/16   Dustin Flock, MD  fluticasone  (FLONASE) 50 MCG/ACT nasal spray Place 2 sprays into both nostrils daily as needed for allergies or rhinitis.     Historical Provider, MD  gabapentin (NEURONTIN) 300 MG capsule Take 1 capsule (300 mg total) by mouth 3 (three) times daily. 05/15/16   Consuella Lose, MD  LINZESS 290 MCG CAPS capsule Take 290 mcg by mouth daily.    Historical Provider, MD  lisinopril (PRINIVIL,ZESTRIL) 20 MG tablet Take 1 tablet (20 mg total) by mouth daily. 01/16/16   Henreitta Leber, MD  omeprazole (PRILOSEC) 40 MG capsule Take 40 mg by mouth daily.    Historical Provider, MD  ondansetron (ZOFRAN ODT) 4 MG disintegrating tablet Take 1 tablet (4 mg total) by mouth every 8 (eight) hours as needed for nausea or vomiting. 12/03/15   Eula Listen, MD  oxyCODONE-acetaminophen (PERCOCET/ROXICET) 5-325 MG tablet Take 1-2 tablets by mouth every 8 (eight) hours as needed for severe pain. 05/21/16   Dustin Flock, MD  polyethylene glycol [facility]) packet Take 17 g by mouth daily. 05/21/16   Dustin Flock, MD  scopolamine (TRANSDERM-SCOP) 1 MG/3DAYS Place 1 patch onto the skin every 3 (three) days.    Historical Provider, MD  simethicone (MYLICON) 80 MG chewable tablet Chew 1 tablet (80 mg total) by mouth 4 (four) times daily as needed for flatulence. 05/21/16   Dustin Flock, MD  tamsulosin (FLOMAX) 0.4 MG CAPS capsule Take 1 capsule (0.4 mg total) by mouth daily. 01/16/16   Henreitta Leber, MD    ALLERGIES:  Allergies  Allergen Reactions  . Codeine Nausea And Vomiting    SOCIAL HISTORY:  Social History  Substance Use Topics  . Smoking status: Never Smoker  . Smokeless tobacco: Never Used  . Alcohol use No     Comment: rare beer    FAMILY HISTORY: Family History  Problem Relation Age of Onset  . CAD Father     EXAM: BP 142/89 (BP Location: Left Arm)   Pulse 88   Temp 97.8 F (36.6 C)   Resp 16   Ht 5\' 8"  (1.727 m)   Wt 170 lb (77.1 kg)   SpO2 100%   BMI 25.85 kg/m  CONSTITUTIONAL: Alert and  oriented and responds appropriately to questions. well-nourished; GCS 15, Elderly, chronically ill-appearing HEAD: Normocephalic; atraumatic EYES: Conjunctivae clear, PERRL, EOMI ENT: normal nose; no rhinorrhea; moist mucous membranes; pharynx without lesions noted; no dental injury; no septal hematoma NECK: Supple, no meningismus, no LAD; no midline spinal tenderness, step-off or deformity, patient keeps his neck flexed, he has surgical incision sites with associated scabs to the posterior cervical spine without stranding erythema or warmth, no drainage CARD: RRR; S1 and S2 appreciated; no murmurs, no clicks, no rubs, no gallops RESP: Normal chest excursion without splinting or tachypnea; breath sounds clear and equal bilaterally; no wheezes, no rhonchi, no rales; no hypoxia or respiratory distress CHEST:  chest wall stable, no crepitus or ecchymosis  or deformity, tender to palpation over the right lateral ribs ABD/GI: Normal bowel sounds; non-distended; soft, non-tender, no rebound, no guarding PELVIS:  stable, nontender to palpation BACK:  The back appears normal and is non-tender to palpation, there is no CVA tenderness; no midline spinal tenderness, step-off or deformity EXT: Patient is tender palpation over the right lateral malleolus with associated swelling and ecchymosis. Normal ROM in all joints; otherwise extremity is our non-tender to palpation; no edema; normal capillary refill; no cyanosis, compartments are soft, no joint effusion, no lacerations, 2+ radial and DP pulses bilaterally, no tenderness over the right arm SKIN: Normal color for age and race; warm NEURO: Moves all extremities equally, diminished sensation throughout the right upper extremity which he reports is chronic and unchanged, otherwise sensation to light touch intact diffusely, cranial nerves II through XII intact, patient is hyperreflexive in both his upper and lower extremities, no clonus, strength is 4/5 in bilateral  lower extremities and right upper extremity and 5/5 in his left upper extremity PSYCH: The patient's mood and manner are appropriate. Grooming and personal hygiene are appropriate.  MEDICAL DECISION MAKING: Patient here after a fall. Family reports that he has been weak since surgery and he feels like he is having weakness in his legs. He does appear to have diminished strength in his legs and has increased reflexes. Unclear what his baseline is. No new sensory deficits. Has recently had surgery for cervical myelopathy. I feel he will need an MRI of his cervical spine to see if there is any change. I doubt that there is any infection. He has no fevers, chills, redness or warmth or tenderness in this area. No drainage from his surgical sites.  Discussed with family that this could be deconditioning from recent surgery, hospitalization. Also check basic blood work to see if there is any sign of anemia, electrolyte abnormality, infection.   We'll obtain x-rays of his right ribs, right ankle to see if there is any sign of trauma from his fall. He did not hit his head or lose consciousness.   I have discussed with family at length that if we do not find anything acute, we may need to consult case management and social work's family does not feel that they can take the patient home and care for him at home.  ED PROGRESS: 1:50 AM  Pt's labs are unremarkable. Ribs show no sign of fracture. No infiltrate or edema seen on chest x-ray. He does have a nondisplaced oblique distal fibular fracture. Will place him in a posterior splint to keep him nonweightbearing. Family has been updated with this Turner. MRI, urinalysis pending.   4:40 AM  Pt's urine shows no sign of infection. I was just informed by nursing staff that patient was unable to obtain an MRI because he could not extend his neck and kept in in flexion. Discussed with MRI technician Ryan. We'll discuss with neurosurgery on call for further  recommendations. I feel he will need some type of imaging and it may need to be done under anesthesia.   5:00 AM  D/w Dr. Vertell Limber with [place] for further recommendations.     7:50 AM  Pt awaiting CT scan results. Signed out to Dr. Laneta Simmers to follow up on imaging.  Will consult NSG when results back.  Family is aware of this Turner. If no medical indication for admission to the hospital, we will get case management involved for help with placement. Family is aware that they may have to  pay for resources out of pocket.    EKG Interpretation  Date/Time:  Friday June 07 2016 00:41:01 EDT Ventricular Rate:  91 PR Interval:    QRS Duration: 96 QT Interval:  328 QTC Calculation: 404 R Axis:   14 Text Interpretation:  Sinus rhythm Inferior infarct, old No significant change since last tracing Confirmed by Terecia Plaut,  DO, Bram Hottel ([id number]) on 06/07/2016 12:48:50 AM       SPLINT APPLICATION Date/Time: [id number] Authorized by: Nyra Jabs Consent: Verbal consent obtained. Risks and benefits: risks, benefits and alternatives were discussed Consent given by: patient Splint applied by: orthopedic technician Location details: Right ankle  Splint type: Posterior splint  Supplies used: Fiberglass Post-procedure: The splinted body part was neurovascularly unchanged following the procedure. Patient tolerance: Patient tolerated the procedure well with no immediate complications.    I personally performed the services described in this documentation, which was scribed in my presence. The recorded information has been reviewed and is accurate.     [place], DO 06/07/16 [phone]

## 2016-06-07 ENCOUNTER — Encounter (HOSPITAL_COMMUNITY): Payer: Self-pay | Admitting: Family Medicine

## 2016-06-07 ENCOUNTER — Emergency Department (HOSPITAL_COMMUNITY): Payer: MEDICARE

## 2016-06-07 DIAGNOSIS — S82434A Nondisplaced oblique fracture of shaft of right fibula, initial encounter for closed fracture: Secondary | ICD-10-CM | POA: Diagnosis not present

## 2016-06-07 DIAGNOSIS — Y9389 Activity, other specified: Secondary | ICD-10-CM | POA: Diagnosis not present

## 2016-06-07 DIAGNOSIS — Z885 Allergy status to narcotic agent status: Secondary | ICD-10-CM | POA: Diagnosis not present

## 2016-06-07 DIAGNOSIS — W050XXA Fall from non-moving wheelchair, initial encounter: Secondary | ICD-10-CM | POA: Diagnosis not present

## 2016-06-07 DIAGNOSIS — K5900 Constipation, unspecified: Secondary | ICD-10-CM | POA: Diagnosis not present

## 2016-06-07 DIAGNOSIS — N400 Enlarged prostate without lower urinary tract symptoms: Secondary | ICD-10-CM | POA: Diagnosis present

## 2016-06-07 DIAGNOSIS — Z85828 Personal history of other malignant neoplasm of skin: Secondary | ICD-10-CM | POA: Diagnosis not present

## 2016-06-07 DIAGNOSIS — Z981 Arthrodesis status: Secondary | ICD-10-CM | POA: Diagnosis not present

## 2016-06-07 DIAGNOSIS — R29898 Other symptoms and signs involving the musculoskeletal system: Secondary | ICD-10-CM | POA: Diagnosis present

## 2016-06-07 DIAGNOSIS — M40204 Unspecified kyphosis, thoracic region: Secondary | ICD-10-CM | POA: Diagnosis not present

## 2016-06-07 DIAGNOSIS — M4802 Spinal stenosis, cervical region: Secondary | ICD-10-CM | POA: Diagnosis not present

## 2016-06-07 DIAGNOSIS — I10 Essential (primary) hypertension: Secondary | ICD-10-CM | POA: Diagnosis not present

## 2016-06-07 DIAGNOSIS — Z8249 Family history of ischemic heart disease and other diseases of the circulatory system: Secondary | ICD-10-CM | POA: Diagnosis not present

## 2016-06-07 DIAGNOSIS — G9589 Other specified diseases of spinal cord: Secondary | ICD-10-CM | POA: Diagnosis not present

## 2016-06-07 DIAGNOSIS — G8918 Other acute postprocedural pain: Secondary | ICD-10-CM | POA: Diagnosis not present

## 2016-06-07 DIAGNOSIS — Y92009 Unspecified place in unspecified non-institutional (private) residence as the place of occurrence of the external cause: Secondary | ICD-10-CM | POA: Diagnosis not present

## 2016-06-07 DIAGNOSIS — K219 Gastro-esophageal reflux disease without esophagitis: Secondary | ICD-10-CM | POA: Diagnosis not present

## 2016-06-07 DIAGNOSIS — R531 Weakness: Secondary | ICD-10-CM | POA: Diagnosis not present

## 2016-06-07 LAB — CBC WITH DIFFERENTIAL/PLATELET
BASOS ABS: 0.1 10*3/uL (ref 0.0–0.1)
BASOS PCT: 1 %
Eosinophils Absolute: 1.5 10*3/uL — ABNORMAL HIGH (ref 0.0–0.7)
Eosinophils Relative: 18 %
HEMATOCRIT: 35.4 % — AB (ref 39.0–52.0)
HEMOGLOBIN: 11.4 g/dL — AB (ref 13.0–17.0)
LYMPHS PCT: 19 %
Lymphs Abs: 1.6 10*3/uL (ref 0.7–4.0)
MCH: 28.4 pg (ref 26.0–34.0)
MCHC: 32.2 g/dL (ref 30.0–36.0)
MCV: 88.3 fL (ref 78.0–100.0)
MONO ABS: 0.7 10*3/uL (ref 0.1–1.0)
Monocytes Relative: 8 %
NEUTROS ABS: 4.6 10*3/uL (ref 1.7–7.7)
NEUTROS PCT: 54 %
Platelets: 224 10*3/uL (ref 150–400)
RBC: 4.01 MIL/uL — ABNORMAL LOW (ref 4.22–5.81)
RDW: 14.6 % (ref 11.5–15.5)
WBC: 8.4 10*3/uL (ref 4.0–10.5)

## 2016-06-07 LAB — COMPREHENSIVE METABOLIC PANEL
ALBUMIN: 3 g/dL — AB (ref 3.5–5.0)
ALK PHOS: 80 U/L (ref 38–126)
ALT: 13 U/L — AB (ref 17–63)
AST: 13 U/L — AB (ref 15–41)
Anion gap: 4 — ABNORMAL LOW (ref 5–15)
BILIRUBIN TOTAL: 0.4 mg/dL (ref 0.3–1.2)
BUN: 17 mg/dL (ref 6–20)
CALCIUM: 9.5 mg/dL (ref 8.9–10.3)
CO2: 36 mmol/L — ABNORMAL HIGH (ref 22–32)
CREATININE: 0.64 mg/dL (ref 0.61–1.24)
Chloride: 101 mmol/L (ref 101–111)
GFR calc Af Amer: >60 mL/min (ref >60)
GLUCOSE: 110 mg/dL — AB (ref 65–99)
Potassium: 3.7 mmol/L (ref 3.5–5.1)
Sodium: 141 mmol/L (ref 135–145)
TOTAL PROTEIN: 5.4 g/dL — AB (ref 6.5–8.1)

## 2016-06-07 LAB — URINALYSIS, ROUTINE W REFLEX MICROSCOPIC
BILIRUBIN URINE: NEGATIVE
GLUCOSE, UA: NEGATIVE mg/dL
HGB URINE DIPSTICK: NEGATIVE
KETONES UR: NEGATIVE mg/dL
Leukocytes, UA: NEGATIVE
Nitrite: NEGATIVE
PROTEIN: NEGATIVE mg/dL
Specific Gravity, Urine: 1.013 (ref 1.005–1.030)
pH: 7 (ref 5.0–8.0)

## 2016-06-07 LAB — I-STAT TROPONIN, ED: Troponin i, poc: 0 ng/mL (ref 0.00–0.08)

## 2016-06-07 MED ORDER — HYDROMORPHONE HCL 1 MG/ML IJ SOLN
1.0000 mg | Freq: Once | INTRAMUSCULAR | Status: AC | PRN
  Administered 2016-06-08: 1 mg via INTRAVENOUS
  Filled 2016-06-07: qty 1

## 2016-06-07 MED ORDER — LISINOPRIL 20 MG PO TABS
20.0000 mg | ORAL_TABLET | Freq: Every day | ORAL | Status: DC
  Administered 2016-06-07 – 2016-06-12 (×5): 20 mg via ORAL
  Filled 2016-06-07 (×5): qty 1

## 2016-06-07 MED ORDER — ONDANSETRON HCL 4 MG/2ML IJ SOLN
4.0000 mg | Freq: Once | INTRAMUSCULAR | Status: AC | PRN
  Administered 2016-06-08: 4 mg via INTRAVENOUS
  Filled 2016-06-07: qty 2

## 2016-06-07 MED ORDER — LINACLOTIDE 290 MCG PO CAPS
290.0000 ug | ORAL_CAPSULE | Freq: Every day | ORAL | Status: DC | PRN
  Administered 2016-06-09: 290 ug via ORAL
  Filled 2016-06-07 (×3): qty 1

## 2016-06-07 MED ORDER — POLYETHYLENE GLYCOL 3350 17 G PO PACK
17.0000 g | PACK | Freq: Every day | ORAL | Status: DC | PRN
  Administered 2016-06-09: 17 g via ORAL
  Filled 2016-06-07: qty 1

## 2016-06-07 MED ORDER — TAMSULOSIN HCL 0.4 MG PO CAPS
0.4000 mg | ORAL_CAPSULE | Freq: Every day | ORAL | Status: DC
  Administered 2016-06-07 – 2016-06-12 (×6): 0.4 mg via ORAL
  Filled 2016-06-07 (×6): qty 1

## 2016-06-07 MED ORDER — DILTIAZEM HCL ER COATED BEADS 120 MG PO CP24
120.0000 mg | ORAL_CAPSULE | Freq: Every day | ORAL | Status: DC
  Administered 2016-06-07 – 2016-06-12 (×5): 120 mg via ORAL
  Filled 2016-06-07 (×6): qty 1

## 2016-06-07 MED ORDER — OXYCODONE-ACETAMINOPHEN 5-325 MG PO TABS
2.0000 | ORAL_TABLET | Freq: Once | ORAL | Status: AC
  Administered 2016-06-07: 2 via ORAL
  Filled 2016-06-07: qty 2

## 2016-06-07 MED ORDER — ACETAMINOPHEN 650 MG RE SUPP: 650.0000 mg | Freq: Four times a day (QID) | RECTAL | Status: DC | PRN

## 2016-06-07 MED ORDER — OXYCODONE-ACETAMINOPHEN 5-325 MG PO TABS
1.0000 | ORAL_TABLET | Freq: Three times a day (TID) | ORAL | Status: DC | PRN
  Administered 2016-06-07 – 2016-06-09 (×4): 2 via ORAL
  Administered 2016-06-10 (×2): 1 via ORAL
  Administered 2016-06-11: 2 via ORAL
  Filled 2016-06-07: qty 1
  Filled 2016-06-07 (×2): qty 2
  Filled 2016-06-07: qty 1
  Filled 2016-06-07 (×4): qty 2

## 2016-06-07 MED ORDER — FLUTICASONE PROPIONATE 50 MCG/ACT NA SUSP
2.0000 | Freq: Every day | NASAL | Status: DC | PRN
  Filled 2016-06-07: qty 16

## 2016-06-07 MED ORDER — ONDANSETRON HCL 4 MG/2ML IJ SOLN: 4.0000 mg | Freq: Four times a day (QID) | INTRAMUSCULAR | Status: DC | PRN

## 2016-06-07 MED ORDER — FENTANYL CITRATE (PF) 100 MCG/2ML IJ SOLN
50.0000 ug | Freq: Once | INTRAMUSCULAR | Status: AC
  Administered 2016-06-07: 50 ug via INTRAVENOUS
  Filled 2016-06-07: qty 2

## 2016-06-07 MED ORDER — ZOLPIDEM TARTRATE 5 MG PO TABS: 5.0000 mg | ORAL_TABLET | Freq: Every evening | ORAL | Status: DC | PRN

## 2016-06-07 MED ORDER — PANTOPRAZOLE SODIUM 40 MG PO TBEC
80.0000 mg | DELAYED_RELEASE_TABLET | Freq: Every day | ORAL | Status: DC
  Administered 2016-06-07 – 2016-06-12 (×6): 80 mg via ORAL
  Filled 2016-06-07 (×8): qty 2

## 2016-06-07 MED ORDER — ONDANSETRON HCL 4 MG PO TABS
4.0000 mg | ORAL_TABLET | Freq: Four times a day (QID) | ORAL | Status: DC | PRN
  Filled 2016-06-07: qty 1

## 2016-06-07 MED ORDER — LORAZEPAM 2 MG/ML IJ SOLN
0.5000 mg | Freq: Once | INTRAMUSCULAR | Status: AC
  Administered 2016-06-07: 0.5 mg via INTRAVENOUS
  Filled 2016-06-07: qty 1

## 2016-06-07 MED ORDER — ACETAMINOPHEN 325 MG PO TABS
650.0000 mg | ORAL_TABLET | Freq: Four times a day (QID) | ORAL | Status: DC | PRN
  Administered 2016-06-10 – 2016-06-11 (×2): 650 mg via ORAL
  Filled 2016-06-07 (×2): qty 2

## 2016-06-07 MED ORDER — ENSURE ENLIVE PO LIQD
237.0000 mL | Freq: Two times a day (BID) | ORAL | Status: DC
  Administered 2016-06-08 – 2016-06-11 (×5): 237 mL via ORAL

## 2016-06-07 MED ORDER — HEPARIN SODIUM (PORCINE) 5000 UNIT/ML IJ SOLN
5000.0000 [IU] | Freq: Three times a day (TID) | INTRAMUSCULAR | Status: DC
  Administered 2016-06-07 – 2016-06-12 (×15): 5000 [IU] via SUBCUTANEOUS
  Filled 2016-06-07 (×15): qty 1

## 2016-06-07 NOTE — ED Notes (Signed)
Pt transported to MRI 

## 2016-06-07 NOTE — ED Notes (Signed)
Per Dr. Laneta Simmers wait to call report on pt, so neurosurgery can assess at beside.

## 2016-06-07 NOTE — ED Notes (Signed)
Pt transported to xray 

## 2016-06-07 NOTE — ED Notes (Signed)
Pt here for fall at home. Pt was having issues ambulating after returning home from SNF. Pt c/o neck pain after fall. Pt had back surgery one month ago. Pt is a&ox4. VSS. Family at bedside.

## 2016-06-07 NOTE — Progress Notes (Signed)
MRI Under Anesthesia to be done Monday October 16 @ 12 noon, pt needs to be NPO Sunday October 15 at midnight

## 2016-06-07 NOTE — NC FL2 (Signed)
[place] LEVEL OF CARE SCREENING TOOL     IDENTIFICATION  Patient Name: Benjamin MARKUNAS Sr. Birthdate: 11-08-1945 Sex: male Admission Date (Current Location): 06/06/2016  [facility] and [place] Number:  Herbalist and Address:  [facility], [place] [address], [place], [place] [address]      Provider Number: [id number]  Attending Physician Name and Address:  Leo Grosser, MD  Relative Name and Phone Number:  Hanh, [place] - [phone] (h); [phone] (mobile)     Current Level of Care: Hospital Recommended Level of Care: [place] Prior Approval Number:    Date Approved/Denied:   PASRR Number: [id number] (Eff. 05/13/16)  Discharge Plan: SNF    Current Diagnoses: Patient Active Problem List   Diagnosis Date Noted  . Lower extremity weakness 06/07/2016  . BPH (benign prostatic hyperplasia) 06/07/2016  . Constipation 06/07/2016  . Post-op pain 06/07/2016  . GERD (gastroesophageal reflux disease) 06/07/2016  . Sepsis ([place]) 05/20/2016  . Cervical spondylosis with myelopathy 05/09/2016  . Malnutrition of moderate degree 01/16/2016  . Weakness 01/14/2016  . Headache 01/14/2016  . Spinal cord compression ([place]) 09/14/2015  . Paresthesia of arm 09/14/2015  . Hyponatremia 09/14/2015  . Essential hypertension 09/14/2015  . Skin cancer 09/14/2015  . Debility 09/14/2015  . Right arm weakness 09/14/2015  . Nonspecific abnormal electrocardiogram (ECG) (EKG) 09/14/2015  . Bradycardia 09/14/2015    Orientation RESPIRATION BLADDER Height & Weight     Self, Place, Situation  Normal Continent Weight: 170 lb (77.1 kg) Height:  5\' 8"  (172.7 cm)  BEHAVIORAL SYMPTOMS/MOOD NEUROLOGICAL BOWEL NUTRITION STATUS      Continent Diet (Regular)  AMBULATORY STATUS COMMUNICATION OF NEEDS Skin   Extensive Assist Verbally Normal                       Personal Care Assistance Level of Assistance  Bathing,  Feeding, Dressing Bathing Assistance: Maximum assistance Feeding assistance: Independent Dressing Assistance: Maximum assistance     Functional Limitations Info  Sight, Hearing, Speech Sight Info: Adequate Hearing Info: Impaired Speech Info: Adequate    SPECIAL CARE FACTORS FREQUENCY                       Contractures      Additional Factors Info  Code Status, Allergies Code Status Info: Full Allergies Info: Codeine           Current Medications (06/07/2016):  This is the current hospital active medication list No current facility-administered medications for this encounter.    Current Outpatient Prescriptions  Medication Sig Dispense Refill  . diazepam (VALIUM) 5 MG tablet Take 1 tablet (5 mg total) by mouth every 8 (eight) hours. 30 tablet 0  . diclofenac (VOLTAREN) 75 MG EC tablet Take 75 mg by mouth 2 (two) times daily.    Marland Kitchen diltiazem (CARDIZEM CD) 120 MG 24 hr capsule Take 1 capsule (120 mg total) by mouth daily.    . diphenhydrAMINE (BENADRYL) 25 MG tablet Take 25 mg by mouth every 6 (six) hours as needed for itching.    . docusate sodium (COLACE) 100 MG capsule Take 1 capsule (100 mg total) by mouth 2 (two) times daily. 10 capsule 0  . fluticasone (FLONASE) 50 MCG/ACT nasal spray Place 2 sprays into both nostrils daily as needed for allergies or rhinitis.     Marland Kitchen gabapentin (NEURONTIN) 300 MG capsule Take 1 capsule (300 mg total) by  mouth 3 (three) times daily. 90 capsule 0  . LINZESS 290 MCG CAPS capsule Take 290 mcg by mouth daily as needed (constipation).     Marland Kitchen lisinopril (PRINIVIL,ZESTRIL) 20 MG tablet Take 1 tablet (20 mg total) by mouth daily. 60 tablet 1  . omeprazole (PRILOSEC) 40 MG capsule Take 40 mg by mouth daily.    . ondansetron (ZOFRAN ODT) 4 MG disintegrating tablet Take 1 tablet (4 mg total) by mouth every 8 (eight) hours as needed for nausea or vomiting. 10 tablet 0  . oxyCODONE-acetaminophen (PERCOCET/ROXICET) 5-325 MG tablet Take 1-2 tablets  by mouth every 8 (eight) hours as needed for severe pain. 30 tablet 0  . polyethylene glycol (MIRALAX) packet Take 17 g by mouth daily. (Patient taking differently: Take 17 g by mouth daily as needed for mild constipation. ) 14 each 0  . scopolamine (TRANSDERM-SCOP) 1 MG/3DAYS Place 1 patch onto the skin every 3 (three) days.    . simethicone (MYLICON) 80 MG chewable tablet Chew 1 tablet (80 mg total) by mouth 4 (four) times daily as needed for flatulence. 30 tablet 0  . tamsulosin (FLOMAX) 0.4 MG CAPS capsule Take 1 capsule (0.4 mg total) by mouth daily. 30 capsule 1     Discharge Medications: Please see discharge summary for a list of discharge medications.  Relevant Imaging Results:  Relevant Lab Results:   Additional Information ss# [id number]  Sable Feil, LCSW

## 2016-06-07 NOTE — ED Provider Notes (Signed)
Care assumed from Dr. Leonides Schanz at 0800 with plan for neurosurgery discussion following CT for progressive b/l lower extremity and RUE weakness following cervical laminectomy and fusion.   Results:  BP 163/92   Pulse 105   Temp 97.8 F (36.6 C)   Resp 22   Ht 5\' 8"  (1.727 m)   Wt 170 lb (77.1 kg)   SpO2 98%   BMI 25.85 kg/m   Results for orders placed or performed during the hospital encounter of 06/06/16  CBC with Differential  Result Value Ref Range   WBC 8.4 4.0 - 10.5 K/uL   RBC 4.01 (L) 4.22 - 5.81 MIL/uL   Hemoglobin 11.4 (L) 13.0 - 17.0 g/dL   HCT 35.4 (L) 39.0 - 52.0 %   MCV 88.3 78.0 - 100.0 fL   MCH 28.4 26.0 - 34.0 pg   MCHC 32.2 30.0 - 36.0 g/dL   RDW 14.6 11.5 - 15.5 %   Platelets 224 150 - 400 K/uL   Neutrophils Relative % 54 %   Neutro Abs 4.6 1.7 - 7.7 K/uL   Lymphocytes Relative 19 %   Lymphs Abs 1.6 0.7 - 4.0 K/uL   Monocytes Relative 8 %   Monocytes Absolute 0.7 0.1 - 1.0 K/uL   Eosinophils Relative 18 %   Eosinophils Absolute 1.5 (H) 0.0 - 0.7 K/uL   Basophils Relative 1 %   Basophils Absolute 0.1 0.0 - 0.1 K/uL  Comprehensive metabolic panel  Result Value Ref Range   Sodium 141 135 - 145 mmol/L   Potassium 3.7 3.5 - 5.1 mmol/L   Chloride 101 101 - 111 mmol/L   CO2 36 (H) 22 - 32 mmol/L   Glucose, Bld 110 (H) 65 - 99 mg/dL   BUN 17 6 - 20 mg/dL   Creatinine, Ser 0.64 0.61 - 1.24 mg/dL   Calcium 9.5 8.9 - 10.3 mg/dL   Total Protein 5.4 (L) 6.5 - 8.1 g/dL   Albumin 3.0 (L) 3.5 - 5.0 g/dL   AST 13 (L) 15 - 41 U/L   ALT 13 (L) 17 - 63 U/L   Alkaline Phosphatase 80 38 - 126 U/L   Total Bilirubin 0.4 0.3 - 1.2 mg/dL   GFR calc non Af Amer >60 >60 mL/min   GFR calc Af Amer >60 >60 mL/min   Anion gap 4 (L) 5 - 15  Urinalysis, Routine w reflex microscopic (not at [facility])  Result Value Ref Range   Color, Urine YELLOW YELLOW   APPearance CLEAR CLEAR   Specific Gravity, Urine 1.013 1.005 - 1.030   pH 7.0 5.0 - 8.0   Glucose, UA NEGATIVE NEGATIVE mg/dL    Hgb urine dipstick NEGATIVE NEGATIVE   Bilirubin Urine NEGATIVE NEGATIVE   Ketones, ur NEGATIVE NEGATIVE mg/dL   Protein, ur NEGATIVE NEGATIVE mg/dL   Nitrite NEGATIVE NEGATIVE   Leukocytes, UA NEGATIVE NEGATIVE  I-stat troponin, ED  Result Value Ref Range   Troponin i, poc 0.00 0.00 - 0.08 ng/mL   Comment 3            Dg Ribs Unilateral W/chest Right  Result Date: 06/07/2016 CLINICAL DATA:  Fall with right rib pain.  Generalized weakness. EXAM: RIGHT RIBS AND CHEST - 3+ VIEW COMPARISON:  Chest radiograph 05/20/2016 FINDINGS: No fracture or other bone lesions are seen involving the ribs. There is no evidence of pneumothorax or pleural effusion. Low lung volumes as before. Bibasilar atelectasis is similar. Stable mediastinal contours. IMPRESSION: No evidence of right rib fracture  or acute abnormality. Low lung volumes with bibasilar atelectasis. Electronically Signed   By: Jeb Levering M.D.   On: 06/07/2016 01:37   Dg Ankle Complete Right  Result Date: 06/07/2016 CLINICAL DATA:  Right ankle pain after fall. EXAM: RIGHT ANKLE - COMPLETE 3+ VIEW COMPARISON:  None. FINDINGS: There is an oblique nondisplaced fracture of the distal fibula just proximal to the ankle mortise. No additional fracture. The mortise is preserved. Degenerative change noted in the included hind and midfoot. Plantar calcaneal spur. Vascular calcifications are seen. There is soft tissue edema. IMPRESSION: Oblique nondisplaced distal fibular fracture, proximal to the ankle mortise. Electronically Signed   By: Jeb Levering M.D.   On: 06/07/2016 01:39   Ct Cervical Spine Wo Contrast  Result Date: 06/07/2016 CLINICAL DATA:  Fall yesterday. Recent history of cervical spine fusion. Posterior neck pain. EXAM: CT CERVICAL SPINE WITHOUT CONTRAST TECHNIQUE: Multidetector CT imaging of the cervical spine was performed without intravenous contrast. Multiplanar CT image reconstructions were also generated. COMPARISON:  Cervical  spine MRI 05/11/2016 FINDINGS: Alignment: No traumatic malalignment. The anterior arch of C1 remains high relative to the dens, and there is chronic posterior subluxation at the left C1-2 lateral articulation. Skull base and vertebrae: No acute fracture. C3 to C6 decompressive laminectomies with lateral mass screws and rods. No evidence of hardware displacement. Neighboring bone graft does not appear displaced. Soft tissues and spinal canal: Fluid in the laminectomy defect is poorly seen compared to previous MRI. No gross canal hematoma. No prevertebral edema. Disc levels:  Degenerative changes as recently characterized by MRI. Upper chest: No evidence of active disease. IMPRESSION: 1. No evidence of acute injury. 2. Uncomplicated appearance of recent C3-C6 posterior decompression. Electronically Signed   By: Monte Fantasia M.D.   On: 06/07/2016 07:54    Radiology and laboratory examinations were reviewed by me and used in medical decision making if performed.   MDM:  Patient with unremarkable CT of the neck. Discussed with Dr Kathyrn Sheriff as Pt could not tolerate MR early d/t inability to lie flat with kyphosis, pain and positioning difficulties. Multiple discussions with Dr Merdis Delay of [facility], hospitalist, care management. Patient will require MR to determine if he has ongoing spinal stenosis and spinal cord compression but will require general anaesthesia to achieve this. Pt should have this as soon as possible to determine the need for possible surgical intervention. Discussed with hospitalist and case management regarding admission for necessary study and for severe deterioration in functional status that has accompanied his symptoms.   Diagnoses that have been ruled out:  None  Diagnoses that are still under consideration:  None  Final diagnoses:  Fall, initial encounter  Weakness of both lower extremities  Closed fracture of shaft of right fibula, unspecified fracture morphology, initial encounter       Leo Grosser, MD 06/07/16 [phone]

## 2016-06-07 NOTE — ED Notes (Signed)
Patient transported to CT 

## 2016-06-07 NOTE — Progress Notes (Signed)
Orthopedic Tech Progress Note Patient Details:  Benjamin VANDERWEIDE Sr. December 01, 1945 [id number]  Ortho Devices Type of Ortho Device: Post (short leg) splint Ortho Device/Splint Location: rle Ortho Device/Splint Interventions: Ordered, Application   Karolee Stamps 06/07/2016, 2:57 AM

## 2016-06-07 NOTE — ED Notes (Signed)
RN called CT for status, he is next for CT, informed MD and family

## 2016-06-07 NOTE — Discharge Planning (Signed)
Pt previously d/c'ed  to [facility] on 9/26; from home with spouse.

## 2016-06-07 NOTE — H&P (Addendum)
[facility] History and Physical  Benjamin MATHAI Sr. [id number] DOB: 09-27-1945 DOA: 06/06/2016  Referring physician:  PCP: Marguerita Merles, MD   Chief Complaint: Fall at home.  HPI: Benjamin GRZESKOWIAK Sr. is a 70 y.o. male with pmhx significant for BPH and cancer.  Pt fell at home while moving from chair to wheelchair. Unable to get up. EMS called. Recently home from rehab.  ED Course: Post splint on R leg for fibula fracture. Per neuro admit for MRI and surgery if needed.  Review of Systems:  As per HPI otherwise 10 point review of systems negative.    Past Medical History:  Diagnosis Date  . Arthritis   . Blood clot associated with vein wall inflammation   . BPH (benign prostatic hypertrophy)   . Cancer ([place])   . Cervical pain (neck)   . GERD (gastroesophageal reflux disease)   . Gout   . Headache   . Heart murmur   . Hemorrhoids   . Hypertension   . Insomnia   . Nocturia associated with benign prostatic hypertrophy   . Numbness and tingling of right arm   . PONV (postoperative nausea and vomiting)    Problems tiliting and turning head  . Shortness of breath dyspnea    with exertion  . Squamous cell skin cancer    right ankle  . Stenosis of cervical spine with myelopathy   . Vertigo    Past Surgical History:  Procedure Laterality Date  . COLONOSCOPY WITH PROPOFOL N/A 02/19/2016   Procedure: COLONOSCOPY WITH PROPOFOL;  Surgeon: Manya Silvas, MD;  Location: [facility] ENDOSCOPY;  Service: Endoscopy;  Laterality: N/A;  . ESOPHAGOGASTRODUODENOSCOPY (EGD) WITH PROPOFOL  02/19/2016   Procedure: ESOPHAGOGASTRODUODENOSCOPY (EGD) WITH PROPOFOL;  Surgeon: Manya Silvas, MD;  Location: [facility] ENDOSCOPY;  Service: Endoscopy;;  . LESION REMOVAL Right 10/26/2015   Procedure: EXCISION RIGHT ANKLE SKIN ;  Surgeon: Wallace Going, DO;  Location: [place];  Service: Plastics;  Laterality: Right;  . POSTERIOR CERVICAL FUSION/FORAMINOTOMY N/A 05/09/2016   Procedure: CERVICAL THREE-FOUR, CERVICAL FOUR-FIVE, CERIVCAL FIVE-SIX LAMINECTOMY WITH LATERAL MASS FUSION;  Surgeon: Consuella Lose, MD;  Location: [place] NEURO ORS;  Service: Neurosurgery;  Laterality: N/A;  LAMINECTOMY, LATERAL MASS FUSION C3-C6  . SKIN GRAFT Right 11/08/2015   Procedure: IRRIGATION AND DEBRIDEMENT AND SPLIT THICKNESS SKIN GRAFT RIGHT ANKLE ;  Surgeon: Wallace Going, DO;  Location: [place];  Service: Plastics;  Laterality: Right;   Social History:  reports that he has never smoked. He has never used smokeless tobacco. He reports that he does not drink alcohol or use drugs.  Allergies  Allergen Reactions  . Codeine Nausea And Vomiting    Family History  Problem Relation Age of Onset  . CAD Father      Prior to Admission medications   Medication Sig Start Date End Date Taking? Authorizing Provider  diazepam (VALIUM) 5 MG tablet Take 1 tablet (5 mg total) by mouth every 8 (eight) hours. 05/21/16  Yes Dustin Flock, MD  diclofenac (VOLTAREN) 75 MG EC tablet Take 75 mg by mouth 2 (two) times daily.   Yes Historical Provider, MD  diltiazem (CARDIZEM CD) 120 MG 24 hr capsule Take 1 capsule (120 mg total) by mouth daily. 05/21/16  Yes Dustin Flock, MD  diphenhydrAMINE (BENADRYL) 25 MG tablet Take 25 mg by mouth every 6 (six) hours as needed for itching.   Yes Historical Provider, MD  docusate sodium (COLACE) 100 MG capsule  Take 1 capsule (100 mg total) by mouth 2 (two) times daily. 05/21/16  Yes Dustin Flock, MD  fluticasone (FLONASE) 50 MCG/ACT nasal spray Place 2 sprays into both nostrils daily as needed for allergies or rhinitis.    Yes Historical Provider, MD  gabapentin (NEURONTIN) 300 MG capsule Take 1 capsule (300 mg total) by mouth 3 (three) times daily. 05/15/16  Yes Consuella Lose, MD  LINZESS 290 MCG CAPS capsule Take 290 mcg by mouth daily as needed (constipation).    Yes Historical Provider, MD  lisinopril (PRINIVIL,ZESTRIL) 20 MG tablet  Take 1 tablet (20 mg total) by mouth daily. 01/16/16  Yes Henreitta Leber, MD  omeprazole (PRILOSEC) 40 MG capsule Take 40 mg by mouth daily.   Yes Historical Provider, MD  ondansetron (ZOFRAN ODT) 4 MG disintegrating tablet Take 1 tablet (4 mg total) by mouth every 8 (eight) hours as needed for nausea or vomiting. 12/03/15  Yes Anne-Caroline Mariea Clonts, MD  oxyCODONE-acetaminophen (PERCOCET/ROXICET) 5-325 MG tablet Take 1-2 tablets by mouth every 8 (eight) hours as needed for severe pain. 05/21/16  Yes Dustin Flock, MD  polyethylene glycol [facility]) packet Take 17 g by mouth daily. Patient taking differently: Take 17 g by mouth daily as needed for mild constipation.  05/21/16  Yes Dustin Flock, MD  scopolamine (TRANSDERM-SCOP) 1 MG/3DAYS Place 1 patch onto the skin every 3 (three) days.   Yes Historical Provider, MD  simethicone (MYLICON) 80 MG chewable tablet Chew 1 tablet (80 mg total) by mouth 4 (four) times daily as needed for flatulence. 05/21/16  Yes Dustin Flock, MD  tamsulosin (FLOMAX) 0.4 MG CAPS capsule Take 1 capsule (0.4 mg total) by mouth daily. 01/16/16  Yes Henreitta Leber, MD   Physical Exam: Vitals:   06/07/16 1030 06/07/16 1045 06/07/16 1100 06/07/16 1115  BP: 127/83 132/77 135/81 148/90  Pulse: 85 88 100 96  Resp:    15  Temp:      SpO2: 100% 99% 99% 99%  Weight:      Height:        Wt Readings from Last 3 Encounters:  06/06/16 77.1 kg (170 lb)  05/21/16 80.3 kg (177 lb 1.6 oz)  05/09/16 79.8 kg (176 lb)    General:  Appears calm and comfortable Eyes:  PERRL, EOMI, normal lids, iris ENT:  grossly normal hearing, lips & tongue Neck:  no LAD, masses or thyromegaly Cardiovascular:  RRR, no m/r/g. No LE edema.  Respiratory:  CTA bilaterally, no w/r/r. Normal respiratory effort. Abdomen:  soft, ntnd Skin:  no rash or induration seen on limited exam. Healed scar on post neck. Musculoskeletal:  grossly normal tone BUE/BLE Psychiatric:  grossly normal mood and affect,  speech fluent and appropriate Neurologic:  CN 2-12 grossly intact, moves all extremities in coordinated fashion, 5/5 in LE but weak. A&Ox3.          Labs on Admission:  Basic Metabolic Panel:  Recent Labs Lab 06/07/16 0036  NA 141  K 3.7  CL 101  CO2 36*  GLUCOSE 110*  BUN 17  CREATININE 0.64  CALCIUM 9.5   Liver Function Tests:  Recent Labs Lab 06/07/16 0036  AST 13*  ALT 13*  ALKPHOS 80  BILITOT 0.4  PROT 5.4*  ALBUMIN 3.0*   No results for input(s): LIPASE, AMYLASE in the last 168 hours. No results for input(s): AMMONIA in the last 168 hours. CBC:  Recent Labs Lab 06/07/16 0036  WBC 8.4  NEUTROABS 4.6  HGB 11.4*  HCT 35.4*  MCV 88.3  PLT 224   Cardiac Enzymes: No results for input(s): CKTOTAL, CKMB, CKMBINDEX, TROPONINI in the last 168 hours.  BNP (last 3 results) No results for input(s): BNP in the last 8760 hours.  ProBNP (last 3 results) No results for input(s): PROBNP in the last 8760 hours.   Serum creatinine: 0.64 mg/dL 06/07/16 0036 Estimated creatinine clearance: 83.1 mL/min  CBG: No results for input(s): GLUCAP in the last 168 hours.  Radiological Exams on Admission: Dg Ribs Unilateral W/chest Right  Result Date: 06/07/2016 CLINICAL DATA:  Fall with right rib pain.  Generalized weakness. EXAM: RIGHT RIBS AND CHEST - 3+ VIEW COMPARISON:  Chest radiograph 05/20/2016 FINDINGS: No fracture or other bone lesions are seen involving the ribs. There is no evidence of pneumothorax or pleural effusion. Low lung volumes as before. Bibasilar atelectasis is similar. Stable mediastinal contours. IMPRESSION: No evidence of right rib fracture or acute abnormality. Low lung volumes with bibasilar atelectasis. Electronically Signed   By: Jeb Levering M.D.   On: 06/07/2016 01:37   Dg Ankle Complete Right  Result Date: 06/07/2016 CLINICAL DATA:  Right ankle pain after fall. EXAM: RIGHT ANKLE - COMPLETE 3+ VIEW COMPARISON:  None. FINDINGS: There is an  oblique nondisplaced fracture of the distal fibula just proximal to the ankle mortise. No additional fracture. The mortise is preserved. Degenerative change noted in the included hind and midfoot. Plantar calcaneal spur. Vascular calcifications are seen. There is soft tissue edema. IMPRESSION: Oblique nondisplaced distal fibular fracture, proximal to the ankle mortise. Electronically Signed   By: Jeb Levering M.D.   On: 06/07/2016 01:39   Ct Cervical Spine Wo Contrast  Result Date: 06/07/2016 CLINICAL DATA:  Fall yesterday. Recent history of cervical spine fusion. Posterior neck pain. EXAM: CT CERVICAL SPINE WITHOUT CONTRAST TECHNIQUE: Multidetector CT imaging of the cervical spine was performed without intravenous contrast. Multiplanar CT image reconstructions were also generated. COMPARISON:  Cervical spine MRI 05/11/2016 FINDINGS: Alignment: No traumatic malalignment. The anterior arch of C1 remains high relative to the dens, and there is chronic posterior subluxation at the left C1-2 lateral articulation. Skull base and vertebrae: No acute fracture. C3 to C6 decompressive laminectomies with lateral mass screws and rods. No evidence of hardware displacement. Neighboring bone graft does not appear displaced. Soft tissues and spinal canal: Fluid in the laminectomy defect is poorly seen compared to previous MRI. No gross canal hematoma. No prevertebral edema. Disc levels:  Degenerative changes as recently characterized by MRI. Upper chest: No evidence of active disease. IMPRESSION: 1. No evidence of acute injury. 2. Uncomplicated appearance of recent C3-C6 posterior decompression. Electronically Signed   By: Monte Fantasia M.D.   On: 06/07/2016 07:54    EKG: Independently reviewed. VR91, PR 162, QRS 96, QTC 404 no stemi; compared to September 2017 EKG no acute changes  Assessment/Plan Active Problems:   Essential hypertension   Lower extremity weakness   BPH (benign prostatic hyperplasia)    Constipation   Post-op pain   GERD (gastroesophageal reflux disease)   LE weakness Holding benadryl, valium, gabapentin, Mylicon, Scopalmine MRI in AM with pain med, or pt will get done on Monday with anesthesia Neurosurgery following  Constipation Cont linzess  Post Op pain Cont percocet  Hypertension When necessary hydralazine 10 mg IV as needed for severe blood pressure Cont lisinopril Cont cardizem  GERD  PPI 80 mg qd  BPH Cont Flomax   Inpt Med-Surg Code Status: Full DVT Prophylaxis: heparin Family Communication: no  family at bedside Disposition Plan: Pending Improvement    Elwin Mocha, MD Family Medicine [facility] [email or web address] Password [email or web address]

## 2016-06-07 NOTE — ED Notes (Signed)
MRI notified this RN that MRI would be set up for Monday due to having general anesthesia. Dr. Laneta Simmers notified and okay with this. Pt is being admitted to hospital.

## 2016-06-07 NOTE — ED Notes (Signed)
Spoke with SW and she is working to get pt approved for inpatient/obs status.

## 2016-06-07 NOTE — ED Notes (Signed)
Took patient off bedpan; patient "thought he had to go but it was gas"; patient did urinate in the urinal; readjusted on stretcher and resting at this time

## 2016-06-07 NOTE — Clinical Social Work Note (Signed)
CSW received consult for SNF placement earlier today while patient in ED. Patient admitted later today and Turner made to Mr. Benjamin Turner in [phone]. Wife was not present during this Turner. Patient agreeable to SNF and returning to [facility] but wanted CSW to talk with his wife. Attempts made while in patient's room to reach wife on home and mobile without success.   Nurse entered room during Benjamin Turner and informed patient that he will have an MRI tomorrow and if it is negative he will be ready for discharge, but if not, patient may need surgery and will remain in hospital. CSW talked with patient about discharge options if he is ready for discharge on Saturday due to needing insurance authorization - home with [facility] or paying privately and patient reported that he would not be able to pay privately. Patient informed that weekend SW will follow-up with him and his wife on Saturday.   Lakie Mclouth Givens, MSW, LCSW Licensed Clinical Social Worker [place] [phone]

## 2016-06-08 ENCOUNTER — Encounter (HOSPITAL_COMMUNITY): Payer: Self-pay | Admitting: *Deleted

## 2016-06-08 ENCOUNTER — Observation Stay (HOSPITAL_COMMUNITY): Payer: MEDICARE

## 2016-06-08 ENCOUNTER — Inpatient Hospital Stay (HOSPITAL_COMMUNITY): Payer: MEDICARE

## 2016-06-08 ENCOUNTER — Encounter (HOSPITAL_COMMUNITY): Payer: Self-pay | Admitting: Anesthesiology

## 2016-06-08 DIAGNOSIS — R531 Weakness: Principal | ICD-10-CM

## 2016-06-08 DIAGNOSIS — W19XXXS Unspecified fall, sequela: Secondary | ICD-10-CM

## 2016-06-08 DIAGNOSIS — N400 Enlarged prostate without lower urinary tract symptoms: Secondary | ICD-10-CM | POA: Diagnosis not present

## 2016-06-08 DIAGNOSIS — I10 Essential (primary) hypertension: Secondary | ICD-10-CM | POA: Diagnosis not present

## 2016-06-08 DIAGNOSIS — R29898 Other symptoms and signs involving the musculoskeletal system: Secondary | ICD-10-CM | POA: Diagnosis not present

## 2016-06-08 LAB — BASIC METABOLIC PANEL
Anion gap: 5 (ref 5–15)
BUN: 14 mg/dL (ref 6–20)
CALCIUM: 9.3 mg/dL (ref 8.9–10.3)
CO2: 33 mmol/L — AB (ref 22–32)
CREATININE: 0.58 mg/dL — AB (ref 0.61–1.24)
Chloride: 100 mmol/L — ABNORMAL LOW (ref 101–111)
GFR calc non Af Amer: >60 mL/min (ref >60)
GLUCOSE: 94 mg/dL (ref 65–99)
Potassium: 3.9 mmol/L (ref 3.5–5.1)
Sodium: 138 mmol/L (ref 135–145)

## 2016-06-08 LAB — CBC
HCT: 32.3 % — ABNORMAL LOW (ref 39.0–52.0)
Hemoglobin: 10.3 g/dL — ABNORMAL LOW (ref 13.0–17.0)
MCH: 28.1 pg (ref 26.0–34.0)
MCHC: 31.9 g/dL (ref 30.0–36.0)
MCV: 88 fL (ref 78.0–100.0)
PLATELETS: 203 10*3/uL (ref 150–400)
RBC: 3.67 MIL/uL — AB (ref 4.22–5.81)
RDW: 14.6 % (ref 11.5–15.5)
WBC: 7.9 10*3/uL (ref 4.0–10.5)

## 2016-06-08 NOTE — Evaluation (Signed)
Physical Therapy Evaluation Patient Details Name: Benjamin DEMOTT Sr. MRN: [id number] DOB: 22-Nov-1945 Today's Date: 06/08/2016   History of Present Illness  70 y.o. male admitted following a fall at home where he sustained R fibular fx. PMH consists of vertigo, skin ca, HTN, gout, BPH, s/p cervical fusion 04/2016. He d/c'd to SNF for therapy following that sx and had just returned home 06/05/16. During his stay at SNF, he a subsequent admission for sepsis.  Clinical Impression  Pt admitted with above diagnosis. Pt currently with functional limitations due to the deficits listed below (see PT Problem List). On eval, pt required min assist bed mobility and mod assist transfers.  Pt will benefit from skilled PT to increase their independence and safety with mobility to allow discharge to the venue listed below.       Follow Up Recommendations SNF;Supervision for mobility/OOB    Equipment Recommendations  None recommended by PT    Recommendations for Other Services       Precautions / Restrictions Precautions Precautions: Fall Restrictions Weight Bearing Restrictions: Yes RLE Weight Bearing: Non weight bearing Other Position/Activity Restrictions: No WB order in chart. Will maintain NWB due to fibular fx until clarified.      Mobility  Bed Mobility Overal bed mobility: Needs Assistance Bed Mobility: Supine to Sit     Supine to sit: Min assist     General bed mobility comments: +rail, increased time to complete  Transfers Overall transfer level: Needs assistance Equipment used: Rolling walker (2 wheeled) Transfers: Sit to/from [employer] Transfers Sit to Stand: Mod assist   Squat pivot transfers: Mod assist     General transfer comment: verbal cues for sequencing, perform sit to stand with RW. Pt unable to transfer weight through UEs for pivot tranfer. RW removed and squat pivot transfer performed toward left. Pt having difficulty maintaining  NWB RLE.  Ambulation/Gait             General Gait Details: unable  Stairs            Wheelchair Mobility    Modified Rankin (Stroke Patients Only)       Balance                                             Pertinent Vitals/Pain Pain Assessment: 0-10 Pain Score: 5  Pain Location: R flank (bruised ribs from fall) Pain Descriptors / Indicators: Sore;Grimacing;Guarding Pain Intervention(s): Limited activity within patient's tolerance;Monitored during session    Home Living Family/patient expects to be discharged to:: Private residence Living Arrangements: Spouse/significant other Available Help at Discharge: Family;Available 24 hours/day Type of Home: Mobile home Home Access: Stairs to enter Entrance Stairs-Rails: Right;Left;Can reach both Entrance Stairs-Number of Steps: 4 Home Layout: One level Home Equipment: Walker - 2 wheels;Wheelchair - manual;Cane - single point;Bedside commode      Prior Function Level of Independence: Independent with assistive device(s)   Gait / Transfers Assistance Needed: Pt. was previously not using AD, since cervical fusion has been using RW at rehab facility  ADL's / Homemaking Assistance Needed: Bathes at the sink. Assist with ADLs.        Hand Dominance   Dominant Hand: Right    Extremity/Trunk Assessment                      Cervical / Trunk Assessment:  Kyphotic  Communication   Communication: No difficulties  Cognition Arousal/Alertness: Awake/alert Behavior During Therapy: WFL for tasks assessed/performed Overall Cognitive Status: Within Functional Limits for tasks assessed                      General Comments      Exercises     Assessment/Plan    PT Assessment Patient needs continued PT services  PT Problem List Decreased strength;Decreased activity tolerance;Decreased balance;Decreased mobility;Decreased knowledge of precautions          PT Treatment  Interventions DME instruction;Gait training;Stair training;Functional mobility training;Balance training;Therapeutic exercise;Therapeutic activities;Patient/family education;Wheelchair mobility training    PT Goals (Current goals can be found in the Care Plan section)  Acute Rehab PT Goals Patient Stated Goal: get stronger PT Goal Formulation: With patient/family Time For Goal Achievement: 06/22/16 Potential to Achieve Goals: Good    Frequency Min 3X/week   Barriers to discharge        Co-evaluation               End of Session Equipment Utilized During Treatment: Gait belt;Oxygen Activity Tolerance: Patient limited by fatigue;Patient limited by pain Patient left: in chair;with call bell/phone within reach;with family/visitor present Nurse Communication: Mobility status    Functional Assessment Tool Used: clinical judgement Functional Limitation: Mobility: Walking and moving around Mobility: Walking and Moving Around Current Status [id number]): At least 40 percent but less than 60 percent impaired, limited or restricted Mobility: Walking and Moving Around Goal Status [phone]): At least 20 percent but less than 40 percent impaired, limited or restricted    Time: 1414-1431 PT Time Calculation (min) (ACUTE ONLY): 17 min   Charges:   PT Evaluation $PT Eval Moderate Complexity: 1 Procedure     PT G Codes:   PT G-Codes **NOT FOR INPATIENT CLASS** Functional Assessment Tool Used: clinical judgement Functional Limitation: Mobility: Walking and moving around Mobility: Walking and Moving Around Current Status [id number]): At least 40 percent but less than 60 percent impaired, limited or restricted Mobility: Walking and Moving Around Goal Status [phone]): At least 20 percent but less than 40 percent impaired, limited or restricted    Lorriane Shire 06/08/2016, 2:58 PM

## 2016-06-08 NOTE — Progress Notes (Signed)
I spoke with the patient and his wife at length yesterday in the ED. It appears he was doing relatively well initially at the SNF upon his discharge after surgery. Unfortunately, he began to slowly decline as he was spending more and more time in bed at the SNF. When he was discharged home a few days ago he and his wife report being very weak, with his legs giving out. He fell yesterday when they were trying to get him up and brought him to the ED.  On exam, he has diffuse symmetric weakness which is non-focal. I suspect his symptoms are related more to deconditioning however ongoing cervical cord compression certainly should be ruled out, especially at the C2-3 level. His CT was reviewed which demonstrates stable position of the lateral mass construct and stable alignment.   Will plan on following up with him and his wife as soon as the MRI is completed.

## 2016-06-08 NOTE — Progress Notes (Addendum)
Nutrition Brief Note  Patient identified on the Malnutrition Screening Tool (MST) Report for recent weight loss without trying.  Per readings below, pt has had a 9% weight loss since February 2017; not significant for time frame.  Wt Readings from Last 10 Encounters:  06/06/16 170 lb (77.1 kg)  05/21/16 177 lb 1.6 oz (80.3 kg)  05/09/16 176 lb (79.8 kg)  05/06/16 176 lb (79.8 kg)  02/19/16 170 lb (77.1 kg)  01/14/16 178 lb 5 oz (80.9 kg)  12/03/15 180 lb (81.6 kg)  11/08/15 181 lb (82.1 kg)  10/19/15 187 lb (84.8 kg)  10/16/15 187 lb (84.8 kg)    Body mass index is 25.85 kg/m. Patient meets criteria for Overweight based on current BMI.   Current diet order is Regular, patient is consuming approximately 100% of meals at this time.   Also receiving Ensure Enlive oral nutrition supplements BID.  Labs and medications reviewed.   No further nutrition interventions warranted at this time. If nutrition issues arise, please consult RD.   Arthur Holms, RD, LDN Pager #: [phone] After-Hours Pager #: [phone]

## 2016-06-08 NOTE — Progress Notes (Signed)
Patient ID: GRYPHON MAUTE Sr., male   DOB: [birth year], 70 y.o.   MRN: [id number]  PROGRESS NOTE    HAYK MCQUILLEN Sr.  [id number] DOB: 02/23/1946 DOA: 06/06/2016  PCP: Marguerita Merles, MD   Brief Narrative:  70 y.o. male with pmhx significant for BPH, hypertension, cervical stenosis status post repair in 04/2016 apparently doing well since his discharge to SNF but then began to slowly decline and spending more and more time in bed. He was subsequently discharged home from SNF. He fell the day prior to surgery. He did not have focal symptoms but per neurosurgery it is important to rule out cord compression.   Assessment & Plan:   Active Problems:   Weakness / Fall - MRI is pending - Neurosurgery will see once MRI results available - CT cervical spine showed no acute injury. Uncomplicated appearance of recent C3-C6 posterior decompression.    Essential hypertension - Continue Cardizem and lisinopril    BPH (benign prostatic hyperplasia) - Continue flomax    DVT prophylaxis: Heparin subQ  Code Status: full code  Family Communication: No family at the bedside  Disposition Plan: needs MRI for further evaluation    Consultants:   Neurosurgery   Procedures:   None   Antimicrobials:   None    Subjective: No overnight events.   Objective: Vitals:   06/08/16 0912 06/08/16 1322 06/08/16 1600 06/08/16 1658  BP: 117/71 (!) 108/56 116/61 109/67  Pulse: (!) 121 (!) 113 (!) 120 (!) 116  Resp: 20 20 (!) 26 20  Temp: 98 F (36.7 C) 99 F (37.2 C) 100 F (37.8 C) 99 F (37.2 C)  TempSrc: Oral Oral Oral Oral  SpO2: 98% 97% 96% 97%  Weight:      Height:        Intake/Output Summary (Last 24 hours) at 06/08/16 2025 Last data filed at 06/08/16 1323  Gross per 24 hour  Intake              720 ml  Output              600 ml  Net              120 ml   Filed Weights   06/06/16 2344  Weight: 77.1 kg (170 lb)    Examination:  General exam: Appears calm and  comfortable  Respiratory system: Clear to auscultation. Respiratory effort normal. Cardiovascular system: S1 & S2 heard, Rate controlled  Gastrointestinal system: Abdomen is nondistended, soft and nontender. No organomegaly or masses felt. Normal bowel sounds heard. Central nervous system: Alert and oriented. No focal neurological deficits. Extremities: Symmetric 5 x 5 power. Skin: No rashes, lesions or ulcers Psychiatry: Judgement and insight appear normal. Mood & affect appropriate.   Data Reviewed: I have personally reviewed following labs and imaging studies  CBC:  Recent Labs Lab 06/07/16 0036 06/08/16 0254  WBC 8.4 7.9  NEUTROABS 4.6  --   HGB 11.4* 10.3*  HCT 35.4* 32.3*  MCV 88.3 88.0  PLT 224 123456   Basic Metabolic Panel:  Recent Labs Lab 06/07/16 0036 06/08/16 0254  NA 141 138  K 3.7 3.9  CL 101 100*  CO2 36* 33*  GLUCOSE 110* 94  BUN 17 14  CREATININE 0.64 0.58*  CALCIUM 9.5 9.3   GFR: Estimated Creatinine Clearance: 83.1 mL/min (by C-G formula based on SCr of 0.58 mg/dL (L)). Liver Function Tests:  Recent Labs Lab 06/07/16 0036  AST 13*  ALT 13*  ALKPHOS 80  BILITOT 0.4  PROT 5.4*  ALBUMIN 3.0*   No results for input(s): LIPASE, AMYLASE in the last 168 hours. No results for input(s): AMMONIA in the last 168 hours. Coagulation Profile: No results for input(s): INR, PROTIME in the last 168 hours. Cardiac Enzymes: No results for input(s): CKTOTAL, CKMB, CKMBINDEX, TROPONINI in the last 168 hours. BNP (last 3 results) No results for input(s): PROBNP in the last 8760 hours. HbA1C: No results for input(s): HGBA1C in the last 72 hours. CBG: No results for input(s): GLUCAP in the last 168 hours. Lipid Profile: No results for input(s): CHOL, HDL, LDLCALC, TRIG, CHOLHDL, LDLDIRECT in the last 72 hours. Thyroid Function Tests: No results for input(s): TSH, T4TOTAL, FREET4, T3FREE, THYROIDAB in the last 72 hours. Anemia Panel: No results for  input(s): VITAMINB12, FOLATE, FERRITIN, TIBC, IRON, RETICCTPCT in the last 72 hours. Urine analysis:    Component Value Date/Time   COLORURINE YELLOW 06/07/2016 0245   APPEARANCEUR CLEAR 06/07/2016 0245   LABSPEC 1.013 06/07/2016 0245   PHURINE 7.0 06/07/2016 0245   GLUCOSEU NEGATIVE 06/07/2016 0245   HGBUR NEGATIVE 06/07/2016 0245   BILIRUBINUR NEGATIVE 06/07/2016 0245   KETONESUR NEGATIVE 06/07/2016 0245   PROTEINUR NEGATIVE 06/07/2016 0245   NITRITE NEGATIVE 06/07/2016 0245   LEUKOCYTESUR NEGATIVE 06/07/2016 0245   Sepsis Labs: @LABRCNTIP (procalcitonin:4,lacticidven:4)   )No results found for this or any previous visit (from the past 240 hour(s)).    Radiology Studies: Dg Ribs Unilateral W/chest Right Result Date: 06/07/2016 No evidence of right rib fracture or acute abnormality. Low lung volumes with bibasilar atelectasis. Electronically Signed   By: Jeb Levering M.D.   On: 06/07/2016 01:37   Dg Ankle Complete Right Result Date: 06/07/2016 Oblique nondisplaced distal fibular fracture, proximal to the ankle mortise. Electronically Signed   By: Jeb Levering M.D.   On: 06/07/2016 01:39   Ct Cervical Spine Wo Contrast Result Date: 06/07/2016  1. No evidence of acute injury. 2. Uncomplicated appearance of recent C3-C6 posterior decompression. Electronically Signed   By: Monte Fantasia M.D.   On: 06/07/2016 07:54   Dg Chest Port 1 View Result Date: 06/08/2016 Mild bibasilar atelectasis.  Low lung volumes. Electronically Signed   By: Dorise Bullion III M.D   On: 06/08/2016 19:11      Scheduled Meds: . diltiazem  120 mg Oral Daily  . feeding supplement (ENSURE ENLIVE)  237 mL Oral BID BM  . heparin  5,000 Units Subcutaneous Q8H  . lisinopril  20 mg Oral Daily  . pantoprazole  80 mg Oral Daily  . tamsulosin  0.4 mg Oral Daily   Continuous Infusions:    LOS: 1 day    Time spent: 15 minutes  Greater than 50% of the time spent on counseling and coordinating  the care.   Leisa Lenz, MD [facility] Pager [phone]  If 7PM-7AM, please contact night-coverage [email or web address] Password [email or web address] 06/08/2016, 8:25 PM

## 2016-06-09 DIAGNOSIS — N400 Enlarged prostate without lower urinary tract symptoms: Secondary | ICD-10-CM | POA: Diagnosis not present

## 2016-06-09 DIAGNOSIS — I10 Essential (primary) hypertension: Secondary | ICD-10-CM | POA: Diagnosis not present

## 2016-06-09 DIAGNOSIS — R29898 Other symptoms and signs involving the musculoskeletal system: Secondary | ICD-10-CM | POA: Diagnosis not present

## 2016-06-09 DIAGNOSIS — R531 Weakness: Secondary | ICD-10-CM | POA: Diagnosis not present

## 2016-06-09 MED ORDER — SODIUM CHLORIDE 0.9 % IV SOLN
Freq: Once | INTRAVENOUS | Status: AC
  Administered 2016-06-09: 15:00:00 via INTRAVENOUS

## 2016-06-09 NOTE — Evaluation (Addendum)
Clinical/Bedside Swallow Evaluation Patient Details  Name: Benjamin YEARGIN Sr. MRN: [id number] Date of Birth: 1946/03/14  Today's Date: 06/09/2016 Time: SLP Start Time (ACUTE ONLY): 0830 SLP Stop Time (ACUTE ONLY): 0911 SLP Time Calculation (min) (ACUTE ONLY): 41 min  Past Medical History:  Past Medical History:  Diagnosis Date  . Arthritis   . Blood clot associated with vein wall inflammation   . BPH (benign prostatic hypertrophy)   . Cancer ([place])   . Cervical pain (neck)   . GERD (gastroesophageal reflux disease)   . Gout   . Headache   . Heart murmur   . Hemorrhoids   . Hypertension   . Insomnia   . Nocturia associated with benign prostatic hypertrophy   . Numbness and tingling of right arm   . PONV (postoperative nausea and vomiting)    Problems tiliting and turning head  . Shortness of breath dyspnea    with exertion  . Squamous cell skin cancer    right ankle  . Stenosis of cervical spine with myelopathy   . Vertigo    Past Surgical History:  Past Surgical History:  Procedure Laterality Date  . COLONOSCOPY WITH PROPOFOL N/A 02/19/2016   Procedure: COLONOSCOPY WITH PROPOFOL;  Surgeon: Manya Silvas, MD;  Location: [facility] ENDOSCOPY;  Service: Endoscopy;  Laterality: N/A;  . ESOPHAGOGASTRODUODENOSCOPY (EGD) WITH PROPOFOL  02/19/2016   Procedure: ESOPHAGOGASTRODUODENOSCOPY (EGD) WITH PROPOFOL;  Surgeon: Manya Silvas, MD;  Location: [facility] ENDOSCOPY;  Service: Endoscopy;;  . LESION REMOVAL Right 10/26/2015   Procedure: EXCISION RIGHT ANKLE SKIN ;  Surgeon: Wallace Going, DO;  Location: [place];  Service: Plastics;  Laterality: Right;  . POSTERIOR CERVICAL FUSION/FORAMINOTOMY N/A 05/09/2016   Procedure: CERVICAL THREE-FOUR, CERVICAL FOUR-FIVE, CERIVCAL FIVE-SIX LAMINECTOMY WITH LATERAL MASS FUSION;  Surgeon: Consuella Lose, MD;  Location: [place] NEURO ORS;  Service: Neurosurgery;  Laterality: N/A;  LAMINECTOMY, LATERAL MASS FUSION C3-C6  . SKIN GRAFT  Right 11/08/2015   Procedure: IRRIGATION AND DEBRIDEMENT AND SPLIT THICKNESS SKIN GRAFT RIGHT ANKLE ;  Surgeon: Wallace Going, DO;  Location: [place];  Service: Plastics;  Laterality: Right;   HPI:  70 yo male adm to [facility] after fall - with broken leg.  PMH + for C3-C6 decompressive laminectomies with lateral mass screws approximately 3 weeks ago.  Pt also with h/o sepsis, constipation, decreased BP per RN.  RN reported yesterday pt had choking episode on water.  Pt reports issues with excessive secretions.  CXR showed mild atelectasis and is febrile.  H/o of PPI use.     Assessment / Plan / Recommendation Clinical Impression  No indication of CN deficit impacting swallow musculature and no indication of aspiration with po observed *pt self feeding breakfast.  Pt with minimally delayed swallow but clear voice with po.  He does report some difficulty with solids, advised him to mitigation strategies including starting meals with liquids and ordering extra gravy/sauce.  Pt reports chronic problems with secretions *worsening currently*, ? due to xerostomia from medication/oxygen.  SLP provided pt with incentive spirometer and applied water bottle to oxygen to faciliate moisture.  Since pt with clear deconditioning resulting in weak cough/phonation - decreased airway protection, recommend follow up with SLP for EMST to maximize swallow rehabilitation.  Pt and wife agreeble to plan.  Thanks for this consult.      Aspiration Risk  Mild aspiration risk    Diet Recommendation Regular;Thin liquid   Liquid Administration via: Cup;Straw Medication Administration:  (  as tolerated, with food if problematic) Supervision: Patient able to self feed Compensations: Slow rate;Small sips/bites Postural Changes: Seated upright at 90 degrees;Remain upright for at least 30 minutes after po intake    Other  Recommendations Oral Care Recommendations: Oral care BID   Follow up Recommendations Home  health SLP      Frequency and Duration min 2x/week  2 weeks       Prognosis Prognosis for Safe Diet Advancement: Good Barriers to Reach Goals: Time post onset      Swallow Study   General Date of Onset: 06/09/16 HPI: 70 yo male adm to [facility] after fall - with broken leg.  PMH + for C3-C6 decompressive laminectomies with lateral mass screws approximately 3 weeks ago.  Pt also with h/o sepsis, constipation, decreased BP per RN.  RN reported yesterday pt had choking episode on water.  Pt reports issues with excessive secretions.  CXR showed mild atelectasis and is febrile.  H/o of PPI use.   Type of Study: Bedside Swallow Evaluation Diet Prior to this Study: Regular;Thin liquids Temperature Spikes Noted: Yes (100) Respiratory Status: Nasal cannula History of Recent Intubation: No Behavior/Cognition: Alert;Cooperative;Pleasant mood Oral Cavity Assessment: Within Functional Limits Oral Care Completed by SLP: No Oral Cavity - Dentition: Adequate natural dentition Vision: Functional for self-feeding Self-Feeding Abilities: Able to feed self Patient Positioning: Upright in bed (pt with natural chin tuck posture, wife reports this present since his surgery) Baseline Vocal Quality: Low vocal intensity Volitional Cough: Weak Volitional Swallow: Able to elicit    Oral/Motor/Sensory Function Overall Oral Motor/Sensory Function: Within functional limits   Ice Chips Ice chips: Not tested   Thin Liquid Thin Liquid: Within functional limits Presentation: Cup;Straw;Self Fed    Nectar Thick Nectar Thick Liquid: Not tested   Honey Thick Honey Thick Liquid: Not tested   Puree Puree: Not tested   Solid   GO   Solid: Within functional limits Presentation: Self Fed Other Comments: cereal, muffin, cookie    Functional Assessment Tool Used: clinical judgement Functional Limitations: Swallowing Swallow Current Status [id number]): At least 20 percent but less than 40 percent impaired, limited or  restricted Swallow Goal Status [phone]): At least 1 percent but less than 20 percent impaired, limited or restricted   Claudie Fisherman, [facility] SLP [phone]

## 2016-06-09 NOTE — Care Management CC44 (Signed)
Condition Code 44 Documentation Completed  Patient Details  Name: Benjamin SOMMA Sr. MRN: [id number] Date of Birth: Sep 27, 1945   Condition Code 44 given:    Patient signature on Condition Code 44 notice:    Documentation of 2 MD's agreement:    Code 44 added to claim:       Dellie Catholic, RN 06/09/2016, 3:27 PM

## 2016-06-09 NOTE — Progress Notes (Signed)
MD made aware of pt bp order to hold Cardizem and Lisinipril this morning, recheck BP this morning.

## 2016-06-09 NOTE — Progress Notes (Addendum)
Patient ID: Benjamin DEIGHAN Sr., male   DOB: June 19, 1946, 70 y.o.   MRN: [id number]  PROGRESS NOTE    Benjamin BILODEAU Sr.  [id number] DOB: [birth year] DOA: 06/06/2016  PCP: Marguerita Merles, MD   Brief Narrative:  70 y.o. male with pmhx significant for BPH, hypertension, cervical stenosis status post repair in 04/2016 apparently doing well since his discharge to SNF but then began to slowly decline and spending more and more time in bed. He was subsequently discharged home from SNF. He fell the day prior to surgery. He did not have focal symptoms but per neurosurgery it is important to rule out cord compression.   Assessment & Plan:   Active Problems:   Weakness / Fall - MRI schedule for Monday  - Neurosurgery will see once MRI results available - CT cervical spine showed no acute injury. Uncomplicated appearance of recent C3-C6 posterior decompression.    Essential hypertension - Continue Cardizem and lisinopril    BPH (benign prostatic hyperplasia) - Continue flomax    DVT prophylaxis: Heparin subQ  Code Status: full code  Family Communication: wife at the bedside  Disposition Plan: MRI tomorrow   Consultants:   Neurosurgery   Procedures:   None   Antimicrobials:   None    Subjective: No overnight events.   Objective: Vitals:   06/09/16 0139 06/09/16 0550 06/09/16 1010 06/09/16 1442  BP: (!) 91/52 (!) 92/50 (!) 100/58 103/69  Pulse: (!) 101 (!) 56 (!) 107 (!) 119  Resp: 14 16 16 16   Temp: 99.2 F (37.3 C) 97.7 F (36.5 C) 97.8 F (36.6 C) 99.5 F (37.5 C)  TempSrc: Axillary Oral Oral Oral  SpO2: 97% 100% 99% 99%  Weight:      Height:        Intake/Output Summary (Last 24 hours) at 06/09/16 1730 Last data filed at 06/09/16 1443  Gross per 24 hour  Intake                0 ml  Output              100 ml  Net             -100 ml   Filed Weights   06/06/16 2344  Weight: 77.1 kg (170 lb)    Examination:  General exam: Appears calm and  comfortable, no distress  Respiratory system: No wheezing, no rhonchi  Cardiovascular system: S1 & S2 heard, RRR Gastrointestinal system: (+) BS< non tender  Central nervous system: No focal neurological deficits. Extremities: No edema, palpable pulses  Skin: warm and dry  Psychiatry: Normal mood and behavior   Data Reviewed: I have personally reviewed following labs and imaging studies  CBC:  Recent Labs Lab 06/07/16 0036 06/08/16 0254  WBC 8.4 7.9  NEUTROABS 4.6  --   HGB 11.4* 10.3*  HCT 35.4* 32.3*  MCV 88.3 88.0  PLT 224 123456   Basic Metabolic Panel:  Recent Labs Lab 06/07/16 0036 06/08/16 0254  NA 141 138  K 3.7 3.9  CL 101 100*  CO2 36* 33*  GLUCOSE 110* 94  BUN 17 14  CREATININE 0.64 0.58*  CALCIUM 9.5 9.3   GFR: Estimated Creatinine Clearance: 83.1 mL/min (by C-G formula based on SCr of 0.58 mg/dL (L)). Liver Function Tests:  Recent Labs Lab 06/07/16 0036  AST 13*  ALT 13*  ALKPHOS 80  BILITOT 0.4  PROT 5.4*  ALBUMIN 3.0*   No results for input(s): LIPASE, AMYLASE in  the last 168 hours. No results for input(s): AMMONIA in the last 168 hours. Coagulation Profile: No results for input(s): INR, PROTIME in the last 168 hours. Cardiac Enzymes: No results for input(s): CKTOTAL, CKMB, CKMBINDEX, TROPONINI in the last 168 hours. BNP (last 3 results) No results for input(s): PROBNP in the last 8760 hours. HbA1C: No results for input(s): HGBA1C in the last 72 hours. CBG: No results for input(s): GLUCAP in the last 168 hours. Lipid Profile: No results for input(s): CHOL, HDL, LDLCALC, TRIG, CHOLHDL, LDLDIRECT in the last 72 hours. Thyroid Function Tests: No results for input(s): TSH, T4TOTAL, FREET4, T3FREE, THYROIDAB in the last 72 hours. Anemia Panel: No results for input(s): VITAMINB12, FOLATE, FERRITIN, TIBC, IRON, RETICCTPCT in the last 72 hours. Urine analysis:    Component Value Date/Time   COLORURINE YELLOW 06/07/2016 0245   APPEARANCEUR  CLEAR 06/07/2016 0245   LABSPEC 1.013 06/07/2016 0245   PHURINE 7.0 06/07/2016 0245   GLUCOSEU NEGATIVE 06/07/2016 0245   HGBUR NEGATIVE 06/07/2016 0245   BILIRUBINUR NEGATIVE 06/07/2016 0245   KETONESUR NEGATIVE 06/07/2016 0245   PROTEINUR NEGATIVE 06/07/2016 0245   NITRITE NEGATIVE 06/07/2016 0245   LEUKOCYTESUR NEGATIVE 06/07/2016 0245   Sepsis Labs: @LABRCNTIP (procalcitonin:4,lacticidven:4)   )No results found for this or any previous visit (from the past 240 hour(s)).    Radiology Studies: Dg Ribs Unilateral W/chest Right Result Date: 06/07/2016 No evidence of right rib fracture or acute abnormality. Low lung volumes with bibasilar atelectasis. Electronically Signed   By: Jeb Levering M.D.   On: 06/07/2016 01:37   Dg Ankle Complete Right Result Date: 06/07/2016 Oblique nondisplaced distal fibular fracture, proximal to the ankle mortise. Electronically Signed   By: Jeb Levering M.D.   On: 06/07/2016 01:39   Ct Cervical Spine Wo Contrast Result Date: 06/07/2016  1. No evidence of acute injury. 2. Uncomplicated appearance of recent C3-C6 posterior decompression. Electronically Signed   By: Monte Fantasia M.D.   On: 06/07/2016 07:54   Dg Chest Port 1 View Result Date: 06/08/2016 Mild bibasilar atelectasis.  Low lung volumes. Electronically Signed   By: Dorise Bullion III M.D   On: 06/08/2016 19:11      Scheduled Meds: . diltiazem  120 mg Oral Daily  . feeding supplement (ENSURE ENLIVE)  237 mL Oral BID BM  . heparin  5,000 Units Subcutaneous Q8H  . lisinopril  20 mg Oral Daily  . pantoprazole  80 mg Oral Daily  . tamsulosin  0.4 mg Oral Daily   Continuous Infusions:    LOS: 1 day    Time spent: 15 minutes  Greater than 50% of the time spent on counseling and coordinating the care.   Leisa Lenz, MD [facility] Pager [phone]  If 7PM-7AM, please contact night-coverage [email or web address] Password [email or web address] 06/09/2016, 5:30 PM

## 2016-06-09 NOTE — Evaluation (Signed)
Occupational Therapy Evaluation Patient Details Name: Benjamin KERKSTRA Sr. MRN: [id number] DOB: 10-26-1945 Today's Date: 06/09/2016    History of Present Illness 70 y.o. male admitted following a fall at home where he sustained R fibular fx. PMH consists of vertigo, skin ca, HTN, gout, BPH, s/p cervical fusion 04/2016. He d/c'd to SNF for therapy following that sx and had just returned home 06/05/16. During his stay at SNF, he a subsequent admission for sepsis. 06/09/16 awaiting MRI fo neck   Clinical Impression   This 70 yo male admitted with above presents to acute OT with deficits below (see OT problem list) thus affecting his PLOF before neck sx of Independent to Mod I and had only been home 1 day from SNF before re-admission. He will benefit from acute OT with follow up OT at SNF to get to a point his wife can A him at home.    Follow Up Recommendations  SNF;Supervision/Assistance - 24 hour    Equipment Recommendations  Other (comment) (TBD at next venue of care)       Precautions / Restrictions Precautions Precautions: Fall Required Braces or Orthoses:  (pt has soft cervical collar in room, for comfort only per wife (when i applied it, he said it made his neck hurt worse)) Cervical Brace: Soft collar;For comfort Restrictions Weight Bearing Restrictions: Yes RLE Weight Bearing: Weight bearing as tolerated Other Position/Activity Restrictions: No WB order in chart. Will maintain NWB due to fibular fx until clarified.-- I paged me for clarification 06/09/16      Mobility Bed Mobility Overal bed mobility: Needs Assistance Bed Mobility: Supine to Sit     Supine to sit: Mod assist     General bed mobility comments: use of rail VCs for where to place hands and sequence of getting up to EOB, use of bed pad to swing hips around  Transfers Overall transfer level: Needs assistance Equipment used:  (therapist in front of him with cues for him to reach towards middle of  recliner with his left UE) Transfers: Squat Pivot Transfers     Squat pivot transfers: Mod assist     General transfer comment: Pt having difficulty maintaining NWB RLE.    Balance Overall balance assessment: Needs assistance Sitting-balance support: Feet supported;Bilateral upper extremity supported Sitting balance-Leahy Scale: Poor   Postural control: Posterior lean Standing balance support: Bilateral upper extremity supported Standing balance-Leahy Scale: Poor                              ADL Overall ADL's : Needs assistance/impaired Eating/Feeding: Set up;Supervision/ safety (supported sitting)   Grooming: Minimal assistance (supported sitting)   Upper Body Bathing: Supervision/ safety;Set up (supported sitting)   Lower Body Bathing: Maximal assistance;+2 for physical assistance (Mod A sit>partial stand in readiness for squat pivot to left from bed to recliner)   Upper Body Dressing : Moderate assistance (supported sitting in recliner)   Lower Body Dressing: Total assistance;+2 for physical assistance (Mod A sit>partial stand in readiness for squat pivot to left from bed to recliner)   Toilet Transfer: Moderate assistance;Squat-pivot (bed>recliner going to pt's left)   Toileting- Clothing Manipulation and Hygiene: Total assistance;+2 for physical assistance (with 2nd person for Mod A for partial stand)                         Pertinent Vitals/Pain Pain Assessment: 0-10 Pain Score: 6  Pain Location:  neck Pain Descriptors / Indicators: Aching;Sore Pain Intervention(s): Monitored during session;Repositioned;Patient requesting pain meds-RN notified     Hand Dominance Right   Extremity/Trunk Assessment Upper Extremity Assessment Upper Extremity Assessment: RUE deficits/detail RUE Deficits / Details: weak compared to LUE (this was the reason he intially had cervical sx) also reports prior to sx his right hand and arm were going numb RUE  Coordination: decreased fine motor;decreased gross motor           Communication Communication Communication: No difficulties   Cognition Arousal/Alertness: Awake/alert Behavior During Therapy: WFL for tasks assessed/performed Overall Cognitive Status: Within Functional Limits for tasks assessed                                Home Living Family/patient expects to be discharged to:: Skilled nursing facility Living Arrangements: Spouse/significant other Available Help at Discharge: Family;Available 24 hours/day Type of Home: Mobile home Home Access: Stairs to enter Entrance Stairs-Number of Steps: 4 Entrance Stairs-Rails: Right;Left;Can reach both Home Layout: One level     Bathroom Shower/Tub: Tub/shower unit Shower/tub characteristics: Curtain Biochemist, clinical: Standard     Home Equipment: Environmental consultant - 2 wheels;Wheelchair - manual;Cane - single point;Bedside commode   Additional Comments: Pt's wife says there is not enough room in their bathroom to fit a 3in1 over the toilet, but she could set it up in front of the sink so he can sponge bathe      Prior Functioning/Environment Level of Independence: Independent with assistive device(s)  Gait / Transfers Assistance Needed: Pt. was previously not using AD, since cervical fusion has been using RW at rehab facility ADL's / Homemaking Assistance Needed: Bathes at the sink. Assist with ADLs.            OT Problem List: Decreased strength;Impaired balance (sitting and/or standing);Impaired UE functional use;Pain   OT Treatment/Interventions: Self-care/ADL training;Therapeutic exercise;Therapeutic activities;DME and/or AE instruction;Patient/family education;Balance training    OT Goals(Current goals can be found in the care plan section) Acute Rehab OT Goals Patient Stated Goal: to go back to rehab OT Goal Formulation: With patient Time For Goal Achievement: 06/23/16 Potential to Achieve Goals: Good  OT  Frequency: Min 2X/week   Barriers to D/C: Decreased caregiver support             End of Session Equipment Utilized During Treatment: Gait belt Nurse Communication: Mobility status;Patient requests pain meds  Activity Tolerance: Patient tolerated treatment well Patient left: in chair;with call bell/phone within reach;with chair alarm set;with family/visitor present   Time: [id number] OT Time Calculation (min): 28 min Charges:  OT General Charges $OT Visit: 1 Procedure OT Evaluation $OT Eval Moderate Complexity: 1 Procedure OT Treatments $Self Care/Home Management : 8-22 mins G-Codes: OT G-codes **NOT FOR INPATIENT CLASS** Functional Assessment Tool Used: clinical observation Functional Limitation: Self care Self Care Current Status [id number]): At least 80 percent but less than 100 percent impaired, limited or restricted Self Care Goal Status [id number]): At least 40 percent but less than 60 percent impaired, limited or restricted  Almon Register [id number] 06/09/2016, 11:11 AM

## 2016-06-09 NOTE — Care Management Obs Status (Signed)
[facility] NOTIFICATION   Patient Details  Name: Benjamin MAGGIORE Sr. MRN: [id number] Date of Birth: Jun 28, 1946   Medicare Observation Status Notification Given:  Yes    Dellie Catholic, RN 06/09/2016, 3:27 PM

## 2016-06-10 ENCOUNTER — Observation Stay (HOSPITAL_COMMUNITY): Payer: MEDICARE

## 2016-06-10 ENCOUNTER — Encounter (HOSPITAL_COMMUNITY): Payer: Self-pay

## 2016-06-10 ENCOUNTER — Encounter (HOSPITAL_COMMUNITY)
Admission: EM | Disposition: A | Discharge status: Skilled Nursing Facility | Payer: Self-pay | Source: Home / Self Care | Attending: Internal Medicine

## 2016-06-10 DIAGNOSIS — R29898 Other symptoms and signs involving the musculoskeletal system: Secondary | ICD-10-CM | POA: Diagnosis not present

## 2016-06-10 DIAGNOSIS — R531 Weakness: Secondary | ICD-10-CM | POA: Diagnosis not present

## 2016-06-10 DIAGNOSIS — I10 Essential (primary) hypertension: Secondary | ICD-10-CM | POA: Diagnosis not present

## 2016-06-10 DIAGNOSIS — N400 Enlarged prostate without lower urinary tract symptoms: Secondary | ICD-10-CM | POA: Diagnosis not present

## 2016-06-10 SURGERY — RADIOLOGY WITH ANESTHESIA
Anesthesia: General

## 2016-06-10 MED ORDER — LACTATED RINGERS IV SOLN
INTRAVENOUS | Status: DC
  Administered 2016-06-11: 09:00:00 via INTRAVENOUS

## 2016-06-10 NOTE — Progress Notes (Signed)
MRI TO BE DONE ON 10/17 @ 1:15pm  PT NEEDS TO BE NPO AT MIDNIGHT, SCAN TO BE DONE UNDER GENERAL ANESTHESIA, RN TO BE NOTIFIED AND MD

## 2016-06-10 NOTE — Clinical Social Work Placement (Signed)
   CLINICAL SOCIAL WORK PLACEMENT  NOTE  Date:  06/10/2016  Patient Details  Name: Benjamin MARTINKA Sr. MRN: [id number] Date of Birth: [birth year]  Clinical Social Work is seeking post-discharge placement for this patient at the [place] level of care (*CSW will initial, date and re-position this form in  chart as items are completed):  Yes   Patient/family provided with [facility] Work Department's list of facilities offering this level of care within the geographic area requested by the patient (or if unable, by the patient's family).  Yes   Patient/family informed of their freedom to choose among providers that offer the needed level of care, that participate in Medicare, Medicaid or managed care program needed by the patient, have an available bed and are willing to accept the patient.  Yes   Patient/family informed of [facility]'s ownership interest in [facility] and [facility], as well as of the fact that they are under no obligation to receive care at these facilities.  PASRR submitted to EDS on       PASRR number received on       Existing PASRR number confirmed on 06/07/16     FL2 transmitted to all facilities in geographic area requested by pt/family on 06/07/16     FL2 transmitted to all facilities within larger geographic area on 06/10/16     Patient informed that his/her managed care company has contracts with or will negotiate with certain facilities, including the following:        Yes   Patient/family informed of bed offers received.  Patient chooses bed at       Physician recommends and patient chooses bed at      Patient to be transferred to   on  .  Patient to be transferred to facility by       Patient family notified on   of transfer.  Name of family member notified:        PHYSICIAN       Additional Comment:    _______________________________________________ Darden Dates, LCSW 06/10/2016, 10:49  AM

## 2016-06-10 NOTE — Progress Notes (Signed)
Patient reports that had breakfast (muffin, sherbet ice cream, coke) before 0900. Dr. Marcell Barlow made aware and stated that patient needed to be transferred back to floor.  Kasandra Knudsen, nurse on 4M, called and given report.

## 2016-06-10 NOTE — Clinical Social Work Note (Signed)
Clinical Social Work Assessment  Patient Details  Name: Benjamin NAJI Sr. MRN: [id number] Date of Birth: May 10, 1946  Date of referral:  06/10/16               Reason for consult:  Facility Placement, Discharge Planning                Permission sought to share information with:  Family Supports Permission granted to share information::  Yes, Verbal Permission Granted  Name::     Benjamin Turner  Relationship::  wife  Contact Information:  [phone]  Housing/Transportation Living arrangements for the past 2 months:  [place] of Information:  Patient, Spouse, Other (Comment Required) (sister in law and brother in Sports coach) Patient Interpreter Needed:  None Criminal Activity/Legal Involvement Pertinent to Current Situation/Hospitalization:  No - Comment as needed Significant Relationships:  Other Family Members, Spouse Lives with:  Spouse Do you feel safe going back to the place where you live?  Yes Need for family participation in patient care:  Yes (Comment)  Care giving concerns:  Pt is in need of a higher level of care.   Social Worker assessment / plan:  CSW met with pt to address consult for New SNF. Pt's wife, sister in law, and brother in law were present with pt's permission. CSW introduced herself and explained role of social work. CSW also explained the process of discharging to SNF with [facility]. PT is recommending STR at SNF, which pt and family are agreeable to. Bed search was iniated on 10/13. CSW followed up with bed offers. Pt's wife expressed an interest in [facility] and the second choice would be [facility]. CSW sent referral to facility, and is awaiting determination. CSW also spoke with admissions at [facility], and they are able to do a LOG as [employer] requires a prior auth for SNF placement. CSW updated MD of concerns that family has about issues when pt has been sitting in the chair too long, and MD is aware. MRI is pending. CSW will  continue to follow.   Employment status:  Retired Nurse, adult PT Recommendations:  [place] / Referral to community resources:  [place]  Patient/Family's Response to care:  Pt and family were appreciative of CSW support.   Patient/Family's Understanding of and Emotional Response to Diagnosis, Current Treatment, and Prognosis:  Pt and family understand that pt would benefit from STR at SNF, however feels that more intense therapy is needed at [facility].   Emotional Assessment Appearance:  Appears stated age Attitude/Demeanor/Rapport:   (Appropriate) Affect (typically observed):  Accepting, Adaptable Orientation:  Oriented to Self, Oriented to Place, Oriented to  Time, Oriented to Situation Alcohol / Substance use:  Never Used Psych involvement (Current and /or in the community):  No (Comment)  Discharge Needs  Concerns to be addressed:  Adjustment to Illness Readmission within the last 30 days:  Yes Current discharge risk:  Chronically ill Barriers to Discharge:  Continued Medical Work up   [employer], LCSW 06/10/2016, 10:43 AM

## 2016-06-10 NOTE — Progress Notes (Signed)
Physical Therapy Treatment Patient Details Name: Benjamin CHRISTEL Sr. MRN: [id number] DOB: 1946/04/13 Today's Date: 06/10/2016    History of Present Illness 70 y.o. male admitted following a fall at home where he sustained R fibular fx. PMH consists of vertigo, skin ca, HTN, gout, BPH, s/p cervical fusion 04/2016. He d/c'd to SNF for therapy following that sx and had just returned home 06/05/16. During his stay at SNF, he a subsequent admission for sepsis. 06/09/16 awaiting MRI fo neck    PT Comments    Pt reporting increased fatigue and weakness today. Attempted sitting EOB but pt unable to maintain sitting balance requiring return to supine. Pt to undergo MRI this AM.  Follow Up Recommendations  SNF     Equipment Recommendations  None recommended by PT    Recommendations for Other Services       Precautions / Restrictions Precautions Precautions: Fall Cervical Brace: Soft collar;For comfort Restrictions RLE Weight Bearing: Weight bearing as tolerated    Mobility  Bed Mobility   Bed Mobility: Supine to Sit;Sit to Supine     Supine to sit: Mod assist Sit to supine: Max assist   General bed mobility comments: +rail, bed pad used to scoot to EOB. Pt retropulsive upon sitting and unable to maintain sitting balance >1 minute even with mod assist. Pt required return to supine.  Transfers                 General transfer comment: unable to tolerate OOB  Ambulation/Gait             General Gait Details: unable   Stairs            Wheelchair Mobility    Modified Rankin (Stroke Patients Only)       Balance   Sitting-balance support: Bilateral upper extremity supported;Feet supported Sitting balance-Leahy Scale: Poor                              Cognition Arousal/Alertness: Awake/alert Behavior During Therapy: WFL for tasks assessed/performed Overall Cognitive Status: Within Functional Limits for tasks assessed                       Exercises      General Comments        Pertinent Vitals/Pain Pain Assessment: 0-10 Pain Score: 4  Pain Location: neck Pain Descriptors / Indicators: Sore Pain Intervention(s): Monitored during session    Home Living                      Prior Function            PT Goals (current goals can now be found in the care plan section) Acute Rehab PT Goals Patient Stated Goal: get better PT Goal Formulation: With patient/family Time For Goal Achievement: 06/22/16 Potential to Achieve Goals: Good Progress towards PT goals: Not progressing toward goals - comment (increased weakness)    Frequency    Min 3X/week      PT Plan Current plan remains appropriate    Co-evaluation             End of Session Equipment Utilized During Treatment: Gait belt;Oxygen Activity Tolerance: Patient limited by fatigue Patient left: in bed;with call bell/phone within reach;with family/visitor present     Time: [id number] PT Time Calculation (min) (ACUTE ONLY): 13 min  Charges:  $Therapeutic Activity: 8-22 mins  G Codes:  Functional Assessment Tool Used: clinical judgement Functional Limitation: Mobility: Walking and moving around Mobility: Walking and Moving Around Current Status [phone]): At least 60 percent but less than 80 percent impaired, limited or restricted Mobility: Walking and Moving Around Goal Status [phone]): At least 20 percent but less than 40 percent impaired, limited or restricted   Lorriane Shire 06/10/2016, 11:43 AM

## 2016-06-10 NOTE — Progress Notes (Signed)
SLP Cancellation Note  Patient Details Name: Benjamin WALSER Sr. MRN: [id number] DOB: 07/23/1946   Cancelled treatment:       Reason Eval/Treat Not Completed: Patient at procedure or test/unavailable  Gabriel Rainwater [place], CCC-SLP [phone]  Avleen Bordwell Meryl 06/10/2016, 11:28 AM

## 2016-06-10 NOTE — Care Management Note (Signed)
Case Management Note  Patient Details  Name: Benjamin SAHIN Sr. MRN: [id number] Date of Birth: May 05, 1946  Subjective/Objective:    Pt in with lower extremity weakness. He is from home with his spouse but was at [facility] until Thursday of this past week.               Action/Plan: Awaiting MRI under general anesthesia. Recommendations are for SNF. Wife does not feel she can care for him at home with his decreased mobility. CM following for discharge disposition.  Expected Discharge Date:  06/10/16               Expected Discharge Plan:  Skilled Nursing Facility  In-House Referral:  Clinical Social Work  Discharge planning Services     Post Acute Care Choice:    Choice offered to:     DME Arranged:    DME Agency:     HH Arranged:    HH Agency:     Status of Service:  In process, will continue to follow  If discussed at Long Length of Stay Meetings, dates discussed:    Additional Comments:  Pollie Friar, RN 06/10/2016, 3:32 PM

## 2016-06-10 NOTE — Progress Notes (Signed)
Patient ID: Benjamin CANFIELD Sr., male   DOB: June 27, 1946, 70 y.o.   MRN: [id number]  PROGRESS NOTE    Benjamin CRAFTS Sr.  [id number] DOB: 11-Jun-1946 DOA: 06/06/2016  PCP: Marguerita Merles, MD   Brief Narrative:  70 y.o. male with pmhx significant for BPH, hypertension, cervical stenosis status post repair in 04/2016 apparently doing well since his discharge to SNF but then began to slowly decline and spending more and more time in bed. He was subsequently discharged home from SNF. He fell the day prior to surgery. He did not have focal symptoms but per neurosurgery it is important to rule out cord compression.   Assessment & Plan:   Active Problems:   Weakness / Fall - CT cervical spine showed no acute injury. Uncomplicated appearance of recent C3-C6 posterior decompression. - MRI scheduled for today - Follow up on neurosurgery recommendations     Essential hypertension - Held Cardizem and lisinopril yesterday due to soft BP - BP 94/54 this am so will continue to hold  BP meds    BPH (benign prostatic hyperplasia) - Continue flomax    DVT prophylaxis: Heparin subQ  Code Status: full code  Family Communication: wife at the bedside  Disposition Plan: MRI today    Consultants:   Neurosurgery   Procedures:   None   Antimicrobials:   None    Subjective: No overnight events.   Objective: Vitals:   06/09/16 1813 06/09/16 2121 06/10/16 0125 06/10/16 0538  BP: 97/60 110/64 (!) 99/47 (!) 94/54  Pulse: (!) 114 (!) 111 (!) 108 95  Resp: 17 18 18 18   Temp: 99.7 F (37.6 C) 100.3 F (37.9 C) 98.9 F (37.2 C) 98.8 F (37.1 C)  TempSrc: Oral Oral Oral Oral  SpO2: 99% 96% 95% 97%  Weight:      Height:        Intake/Output Summary (Last 24 hours) at 06/10/16 [id number] Last data filed at 06/10/16 0436  Gross per 24 hour  Intake                0 ml  Output              425 ml  Net             -425 ml   Filed Weights   06/06/16 2344  Weight: 77.1 kg (170 lb)     Examination:  General exam: no distress  Respiratory system: No wheezing, no rhonchi  Cardiovascular system: S1 & S2 heard, rate controlled  Gastrointestinal system: (+) BS, no distention  Central nervous system: LE weakness otherwise non focal  Extremities: No edema, palpable pulses bilaterally  Skin: no lesions or ulcers  Psychiatry: Normal mood and behavior, not agitated   Data Reviewed: I have personally reviewed following labs and imaging studies  CBC:  Recent Labs Lab 06/07/16 0036 06/08/16 0254  WBC 8.4 7.9  NEUTROABS 4.6  --   HGB 11.4* 10.3*  HCT 35.4* 32.3*  MCV 88.3 88.0  PLT 224 [id number]   Basic Metabolic Panel:  Recent Labs Lab 06/07/16 0036 06/08/16 0254  NA 141 138  K 3.7 3.9  CL 101 100*  CO2 36* 33*  GLUCOSE 110* 94  BUN 17 14  CREATININE 0.64 0.58*  CALCIUM 9.5 9.3   GFR: Estimated Creatinine Clearance: 83.1 mL/min (by C-G formula based on SCr of 0.58 mg/dL (L)). Liver Function Tests:  Recent Labs Lab 06/07/16 0036  AST 13*  ALT 13*  ALKPHOS  80  BILITOT 0.4  PROT 5.4*  ALBUMIN 3.0*   No results for input(s): LIPASE, AMYLASE in the last 168 hours. No results for input(s): AMMONIA in the last 168 hours. Coagulation Profile: No results for input(s): INR, PROTIME in the last 168 hours. Cardiac Enzymes: No results for input(s): CKTOTAL, CKMB, CKMBINDEX, TROPONINI in the last 168 hours. BNP (last 3 results) No results for input(s): PROBNP in the last 8760 hours. HbA1C: No results for input(s): HGBA1C in the last 72 hours. CBG: No results for input(s): GLUCAP in the last 168 hours. Lipid Profile: No results for input(s): CHOL, HDL, LDLCALC, TRIG, CHOLHDL, LDLDIRECT in the last 72 hours. Thyroid Function Tests: No results for input(s): TSH, T4TOTAL, FREET4, T3FREE, THYROIDAB in the last 72 hours. Anemia Panel: No results for input(s): VITAMINB12, FOLATE, FERRITIN, TIBC, IRON, RETICCTPCT in the last 72 hours. Urine analysis:     Component Value Date/Time   COLORURINE YELLOW 06/07/2016 0245   APPEARANCEUR CLEAR 06/07/2016 0245   LABSPEC 1.013 06/07/2016 0245   PHURINE 7.0 06/07/2016 0245   GLUCOSEU NEGATIVE 06/07/2016 0245   HGBUR NEGATIVE 06/07/2016 0245   BILIRUBINUR NEGATIVE 06/07/2016 0245   KETONESUR NEGATIVE 06/07/2016 0245   PROTEINUR NEGATIVE 06/07/2016 0245   NITRITE NEGATIVE 06/07/2016 0245   LEUKOCYTESUR NEGATIVE 06/07/2016 0245   Sepsis Labs: @LABRCNTIP (procalcitonin:4,lacticidven:4)   )No results found for this or any previous visit (from the past 240 hour(s)).    Radiology Studies: Dg Ribs Unilateral W/chest Right Result Date: 06/07/2016 No evidence of right rib fracture or acute abnormality. Low lung volumes with bibasilar atelectasis. Electronically Signed   By: Jeb Levering M.D.   On: 06/07/2016 01:37   Dg Ankle Complete Right Result Date: 06/07/2016 Oblique nondisplaced distal fibular fracture, proximal to the ankle mortise. Electronically Signed   By: Jeb Levering M.D.   On: 06/07/2016 01:39   Ct Cervical Spine Wo Contrast Result Date: 06/07/2016  1. No evidence of acute injury. 2. Uncomplicated appearance of recent C3-C6 posterior decompression. Electronically Signed   By: Monte Fantasia M.D.   On: 06/07/2016 07:54   Dg Chest Port 1 View Result Date: 06/08/2016 Mild bibasilar atelectasis.  Low lung volumes. Electronically Signed   By: Dorise Bullion III M.D   On: 06/08/2016 19:11      Scheduled Meds: . diltiazem  120 mg Oral Daily  . feeding supplement (ENSURE ENLIVE)  237 mL Oral BID BM  . heparin  5,000 Units Subcutaneous Q8H  . lisinopril  20 mg Oral Daily  . pantoprazole  80 mg Oral Daily  . tamsulosin  0.4 mg Oral Daily   Continuous Infusions:    LOS: 1 day    Time spent: 15 minutes  Greater than 50% of the time spent on counseling and coordinating the care.   Leisa Lenz, MD [facility] Pager [phone]  If 7PM-7AM, please contact  night-coverage [email or web address] Password [email or web address] 06/10/2016, 9:28 AM

## 2016-06-10 NOTE — Anesthesia Preprocedure Evaluation (Deleted)
Anesthesia Evaluation  Patient identified by MRN, date of birth, ID band Patient awake    Reviewed: Allergy & Precautions, H&P , NPO status , Patient's Chart, lab work & pertinent test results  History of Anesthesia Complications (+) PONV  Airway        Dental no notable dental hx.    Pulmonary neg pulmonary ROS,    Pulmonary exam normal        Cardiovascular hypertension, Pt. on medications      Neuro/Psych  Headaches, negative psych ROS   GI/Hepatic Neg liver ROS, GERD  Medicated and Controlled,  Endo/Other  negative endocrine ROS  Renal/GU negative Renal ROS  negative genitourinary   Musculoskeletal  (+) Arthritis , Osteoarthritis,    Abdominal   Peds  Hematology negative hematology ROS (+)   Anesthesia Other Findings   Reproductive/Obstetrics negative OB ROS                             Anesthesia Physical Anesthesia Plan  ASA: III  Anesthesia Plan: General   Post-op Pain Management:    Induction: Intravenous  Airway Management Planned: LMA  Additional Equipment:   Intra-op Plan:   Post-operative Plan: Extubation in OR  Informed Consent: I have reviewed the patients History and Physical, chart, labs and discussed the procedure including the risks, benefits and alternatives for the proposed anesthesia with the patient or authorized representative who has indicated his/her understanding and acceptance.   Dental advisory given  Plan Discussed with: CRNA  Anesthesia Plan Comments:         Anesthesia Quick Evaluation

## 2016-06-11 ENCOUNTER — Observation Stay (HOSPITAL_COMMUNITY): Payer: MEDICARE | Admitting: Certified Registered Nurse Anesthetist

## 2016-06-11 ENCOUNTER — Encounter (HOSPITAL_COMMUNITY)
Admission: EM | Disposition: A | Discharge status: Skilled Nursing Facility | Payer: Self-pay | Source: Home / Self Care | Attending: Internal Medicine

## 2016-06-11 ENCOUNTER — Observation Stay (HOSPITAL_COMMUNITY): Payer: MEDICARE

## 2016-06-11 DIAGNOSIS — R531 Weakness: Secondary | ICD-10-CM | POA: Diagnosis not present

## 2016-06-11 DIAGNOSIS — N400 Enlarged prostate without lower urinary tract symptoms: Secondary | ICD-10-CM | POA: Diagnosis not present

## 2016-06-11 DIAGNOSIS — I10 Essential (primary) hypertension: Secondary | ICD-10-CM | POA: Diagnosis not present

## 2016-06-11 DIAGNOSIS — R29898 Other symptoms and signs involving the musculoskeletal system: Secondary | ICD-10-CM | POA: Diagnosis not present

## 2016-06-11 HISTORY — PX: RADIOLOGY WITH ANESTHESIA: SHX6223

## 2016-06-11 SURGERY — RADIOLOGY WITH ANESTHESIA
Anesthesia: General

## 2016-06-11 MED ORDER — ONDANSETRON HCL 4 MG/2ML IJ SOLN
INTRAMUSCULAR | Status: DC | PRN
  Administered 2016-06-11: 4 mg via INTRAVENOUS

## 2016-06-11 MED ORDER — LIDOCAINE HCL (CARDIAC) 20 MG/ML IV SOLN
INTRAVENOUS | Status: DC | PRN
  Administered 2016-06-11: 100 mg via INTRAVENOUS

## 2016-06-11 MED ORDER — PHENYLEPHRINE HCL 10 MG/ML IJ SOLN
INTRAMUSCULAR | Status: DC | PRN
  Administered 2016-06-11 (×2): 120 ug via INTRAVENOUS
  Administered 2016-06-11: 80 ug via INTRAVENOUS
  Administered 2016-06-11: 120 ug via INTRAVENOUS

## 2016-06-11 MED ORDER — PHENYLEPHRINE HCL 10 MG/ML IJ SOLN
INTRAMUSCULAR | Status: DC | PRN
  Administered 2016-06-11: 20 ug/min via INTRAVENOUS

## 2016-06-11 MED ORDER — FENTANYL CITRATE (PF) 100 MCG/2ML IJ SOLN
INTRAMUSCULAR | Status: DC | PRN
  Administered 2016-06-11 (×2): 25 ug via INTRAVENOUS
  Administered 2016-06-11: 50 ug via INTRAVENOUS

## 2016-06-11 MED ORDER — EPHEDRINE SULFATE 50 MG/ML IJ SOLN
INTRAMUSCULAR | Status: DC | PRN
  Administered 2016-06-11: 10 mg via INTRAVENOUS

## 2016-06-11 MED ORDER — PROPOFOL 10 MG/ML IV BOLUS
INTRAVENOUS | Status: DC | PRN
  Administered 2016-06-11: 200 mg via INTRAVENOUS

## 2016-06-11 NOTE — Progress Notes (Signed)
Patient ID: Benjamin GOBEL Sr., male   DOB: 08-19-1946, 70 y.o.   MRN: [id number]  PROGRESS NOTE    Benjamin ZUCCARELLO Sr.  [id number] DOB: 30-Jul-1946 DOA: 06/06/2016  PCP: Marguerita Merles, MD   Brief Narrative:  70 y.o. male with pmhx significant for BPH, hypertension, cervical stenosis status post repair in 04/2016 apparently doing well since his discharge to SNF but then began to slowly decline and spending more and more time in bed. He was subsequently discharged home from SNF. He fell the day prior to surgery. He did not have focal symptoms but per neurosurgery it is important to rule out cord compression.   Assessment & Plan:   Active Problems:   Lower extremity weakness bilaterally / Fall - CT cervical spine showed no acute injury. Uncomplicated appearance of recent C3-C6 posterior decompression. - MRI of the cervical spine will be done today but will try to extend MRI to include thoracic and lumbar spine to evaluate for lower extremity weakness - Neurosurgery to review the results once MRI is completed    Essential hypertension - Cardizem and lisinopril were held because of soft blood pressure yesterday - Blood pressure is 104/60 and prior to that on 122/64 so okay to continue blood pressure medications    BPH (benign prostatic hyperplasia) - Continue flomax    DVT prophylaxis: Heparin subQ  Code Status: full code  Family Communication: wife at the bedside  Disposition Plan: MRI today    Consultants:   Neurosurgery   Procedures:   None   Antimicrobials:   None    Subjective: No overnight events.   Objective: Vitals:   06/10/16 1734 06/10/16 2205 06/11/16 0057 06/11/16 0531  BP: 101/60 114/63 (!) 102/55 104/60  Pulse: 93 (!) 110 100 92  Resp: 16 16 18 18   Temp: 98.7 F (37.1 C) 98.7 F (37.1 C) 98.8 F (37.1 C) 98.3 F (36.8 C)  TempSrc: Oral Oral Oral Oral  SpO2: 100% 95% 98% 95%  Weight:      Height:       No intake or output data in the 24  hours ending 06/11/16 0940 Filed Weights   06/06/16 2344  Weight: 77.1 kg (170 lb)    Examination:  General exam: no distress, Calm and comfortable Respiratory system: No wheezing, no rhonchi  Cardiovascular system: S1 & S2 heard, rate controlled  Gastrointestinal system: (+) BS, nontender, nondistended Central nervous system: LE weakness otherwise non focal  Extremities: No edema, bilateral pulses Skin: Skin is warm and dry Psychiatry: Normal mood and behavior, not agitated   Data Reviewed: I have personally reviewed following labs and imaging studies  CBC:  Recent Labs Lab 06/07/16 0036 06/08/16 0254  WBC 8.4 7.9  NEUTROABS 4.6  --   HGB 11.4* 10.3*  HCT 35.4* 32.3*  MCV 88.3 88.0  PLT 224 [id number]   Basic Metabolic Panel:  Recent Labs Lab 06/07/16 0036 06/08/16 0254  NA 141 138  K 3.7 3.9  CL 101 100*  CO2 36* 33*  GLUCOSE 110* 94  BUN 17 14  CREATININE 0.64 0.58*  CALCIUM 9.5 9.3   GFR: Estimated Creatinine Clearance: 83.1 mL/min (by C-G formula based on SCr of 0.58 mg/dL (L)). Liver Function Tests:  Recent Labs Lab 06/07/16 0036  AST 13*  ALT 13*  ALKPHOS 80  BILITOT 0.4  PROT 5.4*  ALBUMIN 3.0*   No results for input(s): LIPASE, AMYLASE in the last 168 hours. No results for input(s): AMMONIA in the  last 168 hours. Coagulation Profile: No results for input(s): INR, PROTIME in the last 168 hours. Cardiac Enzymes: No results for input(s): CKTOTAL, CKMB, CKMBINDEX, TROPONINI in the last 168 hours. BNP (last 3 results) No results for input(s): PROBNP in the last 8760 hours. HbA1C: No results for input(s): HGBA1C in the last 72 hours. CBG: No results for input(s): GLUCAP in the last 168 hours. Lipid Profile: No results for input(s): CHOL, HDL, LDLCALC, TRIG, CHOLHDL, LDLDIRECT in the last 72 hours. Thyroid Function Tests: No results for input(s): TSH, T4TOTAL, FREET4, T3FREE, THYROIDAB in the last 72 hours. Anemia Panel: No results for  input(s): VITAMINB12, FOLATE, FERRITIN, TIBC, IRON, RETICCTPCT in the last 72 hours. Urine analysis:    Component Value Date/Time   COLORURINE YELLOW 06/07/2016 0245   APPEARANCEUR CLEAR 06/07/2016 0245   LABSPEC 1.013 06/07/2016 0245   PHURINE 7.0 06/07/2016 0245   GLUCOSEU NEGATIVE 06/07/2016 0245   HGBUR NEGATIVE 06/07/2016 0245   BILIRUBINUR NEGATIVE 06/07/2016 0245   KETONESUR NEGATIVE 06/07/2016 0245   PROTEINUR NEGATIVE 06/07/2016 0245   NITRITE NEGATIVE 06/07/2016 0245   LEUKOCYTESUR NEGATIVE 06/07/2016 0245   Sepsis Labs: @LABRCNTIP (procalcitonin:4,lacticidven:4)   )No results found for this or any previous visit (from the past 240 hour(s)).    Radiology Studies: Dg Ribs Unilateral W/chest Right Result Date: 06/07/2016 No evidence of right rib fracture or acute abnormality. Low lung volumes with bibasilar atelectasis. Electronically Signed   By: Jeb Levering M.D.   On: 06/07/2016 01:37   Dg Ankle Complete Right Result Date: 06/07/2016 Oblique nondisplaced distal fibular fracture, proximal to the ankle mortise. Electronically Signed   By: Jeb Levering M.D.   On: 06/07/2016 01:39   Ct Cervical Spine Wo Contrast Result Date: 06/07/2016  1. No evidence of acute injury. 2. Uncomplicated appearance of recent C3-C6 posterior decompression. Electronically Signed   By: Monte Fantasia M.D.   On: 06/07/2016 07:54   Dg Chest Port 1 View Result Date: 06/08/2016 Mild bibasilar atelectasis.  Low lung volumes. Electronically Signed   By: Dorise Bullion III M.D   On: 06/08/2016 19:11    Scheduled Meds: . [MAR Hold] diltiazem  120 mg Oral Daily  . [MAR Hold] feeding supplement (ENSURE ENLIVE)  237 mL Oral BID BM  . [MAR Hold] heparin  5,000 Units Subcutaneous Q8H  . [MAR Hold] lisinopril  20 mg Oral Daily  . [MAR Hold] pantoprazole  80 mg Oral Daily  . [MAR Hold] tamsulosin  0.4 mg Oral Daily   Continuous Infusions: . lactated ringers       LOS: 1 day    Time  spent: 15 minutes  Greater than 50% of the time spent on counseling and coordinating the care.   Leisa Lenz, MD [facility] Pager [phone]  If 7PM-7AM, please contact night-coverage [email or web address] Password [email or web address] 06/11/2016, 9:40 AM

## 2016-06-11 NOTE — Anesthesia Procedure Notes (Signed)
Procedure Name: LMA Insertion Date/Time: 06/11/2016 9:16 AM Performed by: Rejeana Brock L Pre-anesthesia Checklist: Patient identified, Emergency Drugs available, Suction available and Patient being monitored Patient Re-evaluated:Patient Re-evaluated prior to inductionOxygen Delivery Method: Circle System Utilized Preoxygenation: Pre-oxygenation with 100% oxygen Intubation Type: IV induction Ventilation: Mask ventilation without difficulty LMA: LMA inserted LMA Size: 5.0 Number of attempts: 1 Airway Equipment and Method: Bite block Placement Confirmation: positive ETCO2 Tube secured with: Tape Dental Injury: Teeth and Oropharynx as per pre-operative assessment

## 2016-06-11 NOTE — Progress Notes (Signed)
CCMD reported 1.14 sec pause.  Message sent to Dr. Charlies Silvers.  Pt resting quietly in bed with no complaints.

## 2016-06-11 NOTE — Progress Notes (Addendum)
Reviewed MRI results. Spoke with [facility] on call to let them know MRI results, they will follow.  Leisa Lenz [facility] [phone]

## 2016-06-11 NOTE — Transfer of Care (Signed)
Immediate Anesthesia Transfer of Care Note  Patient: KETHAN MAMON Sr.  Procedure(s) Performed: Procedure(s): MRI CERVICAL SPINE WITHOUT CONTRAST (N/A)  Patient Location: PACU  Anesthesia Type:General  Level of Consciousness: awake and lethargic  Airway & Oxygen Therapy: Patient Spontanous Breathing and Patient connected to nasal cannula oxygen  Post-op Assessment: Report given to RN, Post -op Vital signs reviewed and stable and Patient moving all extremities X 4  Post vital signs: Reviewed and stable  Last Vitals:  Vitals:   06/11/16 0531 06/11/16 1140  BP: 104/60   Pulse: 92   Resp: 18   Temp: 36.8 C 36.8 C    Last Pain:  Vitals:   06/11/16 1140  TempSrc:   PainSc: 0-No pain      Patients Stated Pain Goal: 3 ([id number])  Complications: No apparent anesthesia complications

## 2016-06-11 NOTE — Progress Notes (Signed)
SLP Cancellation Note  Patient Details Name: Benjamin MUSA Sr. MRN: [id number] DOB: [birth year]   Cancelled treatment:       Reason Eval/Treat Not Completed:  (pt npo for procedure today, will continue efforts)   Luanna Salk, [facility] SLP [phone]

## 2016-06-11 NOTE — Progress Notes (Signed)
I have reviewed the MRI. There is good decompression of the spinal cord from C3-6, with minimal stenosis at C2-3. There is mild-moderate stenosis at C6-7 and suggestion of cord contusion/stroke at this level. In speaking with Benjamin Turner wife today, she seemed to indicate that his numbness/weakness comes on and gets worse when he is sitting upright. My only other concern with this history is that he could have some mobility either at C2-3 or C6-7. I have therefore ordered dynamic cervical spine xr. Will review these and then speak with the patient and his wife about any possible need for further surgical treatment.

## 2016-06-11 NOTE — Anesthesia Preprocedure Evaluation (Signed)
Anesthesia Evaluation  Patient identified by MRN, date of birth, ID band Patient awake    Reviewed: Allergy & Precautions, NPO status , Patient's Chart, lab work & pertinent test results  History of Anesthesia Complications (+) PONV  Airway Mallampati: II  TM Distance: >3 FB Neck ROM: Full    Dental   Pulmonary    Pulmonary exam normal        Cardiovascular hypertension, Pt. on medications Normal cardiovascular exam+ Valvular Problems/Murmurs MR      Neuro/Psych    GI/Hepatic GERD  Medicated and Controlled,  Endo/Other    Renal/GU      Musculoskeletal   Abdominal   Peds  Hematology   Anesthesia Other Findings   Reproductive/Obstetrics                             Anesthesia Physical Anesthesia Plan  ASA: III  Anesthesia Plan: General   Post-op Pain Management:    Induction: Intravenous  Airway Management Planned: LMA  Additional Equipment:   Intra-op Plan:   Post-operative Plan: Extubation in OR  Informed Consent: I have reviewed the patients History and Physical, chart, labs and discussed the procedure including the risks, benefits and alternatives for the proposed anesthesia with the patient or authorized representative who has indicated his/her understanding and acceptance.     Plan Discussed with: CRNA and Surgeon  Anesthesia Plan Comments:         Anesthesia Quick Evaluation

## 2016-06-12 ENCOUNTER — Encounter (HOSPITAL_COMMUNITY): Payer: Self-pay | Admitting: Radiology

## 2016-06-12 DIAGNOSIS — W19XXXA Unspecified fall, initial encounter: Secondary | ICD-10-CM | POA: Diagnosis not present

## 2016-06-12 DIAGNOSIS — N400 Enlarged prostate without lower urinary tract symptoms: Secondary | ICD-10-CM | POA: Diagnosis not present

## 2016-06-12 DIAGNOSIS — S82401A Unspecified fracture of shaft of right fibula, initial encounter for closed fracture: Secondary | ICD-10-CM

## 2016-06-12 DIAGNOSIS — R531 Weakness: Secondary | ICD-10-CM | POA: Diagnosis not present

## 2016-06-12 DIAGNOSIS — R29898 Other symptoms and signs involving the musculoskeletal system: Secondary | ICD-10-CM

## 2016-06-12 DIAGNOSIS — I10 Essential (primary) hypertension: Secondary | ICD-10-CM | POA: Diagnosis not present

## 2016-06-12 LAB — PSA: PSA: 0.55 ng/mL (ref 0.00–4.00)

## 2016-06-12 MED ORDER — OXYCODONE-ACETAMINOPHEN 5-325 MG PO TABS: 1.0000 | ORAL_TABLET | Freq: Three times a day (TID) | ORAL | 0 refills | Status: DC | PRN

## 2016-06-12 MED FILL — Lidocaine HCl Local Soln Prefilled Syringe 100 MG/5ML (2%): INTRAMUSCULAR | Qty: 5 | Status: AC

## 2016-06-12 MED FILL — Phenylephrine-NaCl Pref Syr 0.4 MG/10ML-0.9% (40 MCG/ML): INTRAVENOUS | Qty: 20 | Status: AC

## 2016-06-12 MED FILL — Propofol IV Emul 200 MG/20ML (10 MG/ML): INTRAVENOUS | Qty: 20 | Status: AC

## 2016-06-12 MED FILL — Phenylephrine HCl Inj 10 MG/ML: INTRAMUSCULAR | Qty: 1 | Status: AC

## 2016-06-12 MED FILL — Fentanyl Citrate Preservative Free (PF) Inj 100 MCG/2ML: INTRAMUSCULAR | Qty: 2 | Status: AC

## 2016-06-12 MED FILL — Lactated Ringer's Solution: INTRAVENOUS | Qty: 1000 | Status: AC

## 2016-06-12 MED FILL — Ephedrine Sulfate Inj 50 MG/ML: INTRAMUSCULAR | Qty: 1 | Status: AC

## 2016-06-12 NOTE — Clinical Social Work Placement (Signed)
   CLINICAL SOCIAL WORK PLACEMENT  NOTE  Date:  06/12/2016  Patient Details  Name: Benjamin CARDA Sr. MRN: [id number] Date of Birth: 12-Sep-1945  Clinical Social Work is seeking post-discharge placement for this patient at the [place] level of care (*CSW will initial, date and re-position this form in  chart as items are completed):  Yes   Patient/family provided with [facility] Work Department's list of facilities offering this level of care within the geographic area requested by the patient (or if unable, by the patient's family).  Yes   Patient/family informed of their freedom to choose among providers that offer the needed level of care, that participate in Medicare, Medicaid or managed care program needed by the patient, have an available bed and are willing to accept the patient.  Yes   Patient/family informed of [facility]'s ownership interest in [facility] and [facility], as well as of the fact that they are under no obligation to receive care at these facilities.  PASRR submitted to EDS on       PASRR number received on       Existing PASRR number confirmed on 06/07/16     FL2 transmitted to all facilities in geographic area requested by pt/family on 06/07/16     FL2 transmitted to all facilities within larger geographic area on 06/10/16     Patient informed that his/her managed care company has contracts with or will negotiate with certain facilities, including the following:        Yes   Patient/family informed of bed offers received.  Patient [facility] bed at [facility]     Physician recommends and patient chooses bed at      Patient to be transferred to [facility] on 06/12/16.  Patient to be transferred to facility by [facility]     Patient family notified on 06/12/16 of transfer.  Name of family member notified:  [name]     PHYSICIAN       Additional Comment:     _______________________________________________ Darden Dates, LCSW 06/12/2016, 3:39 PM

## 2016-06-12 NOTE — Clinical Social Work Note (Addendum)
CSW spoke with [employer] and [facility] has given a pending auth. Facility is able to accept pt today with pending auth, and the facility will meet with pt family in the morning to address payments.   Pt is ready for discharge today. Pt and family are agreeable to discharge plan. RN will call report. [facility] will provide transportation. CSW is signing off as no further needs identified.   Darden Dates, MSW, LCSW Clinical Social Worker  [phone]  ADDENDUM: Facility has received discharge information and is ready to admit pt.  Darden Dates, MSW, LCSW

## 2016-06-12 NOTE — Progress Notes (Signed)
Patient discharged to skilled nursing facility.  Discharge information given. Telemetry and IV discontinued. Pt transported to [facility]. Wendee Copp

## 2016-06-12 NOTE — Progress Notes (Signed)
Physical Therapy Treatment Patient Details Name: Benjamin TOMASO Sr. MRN: [id number] DOB: 1946/07/26 Today's Date: 06/12/2016    History of Present Illness 70 y.o. male admitted following a fall at home where he sustained R fibular fx. PMH consists of vertigo, skin ca, HTN, gout, BPH, s/p cervical fusion 04/2016. He d/c'd to SNF for therapy following that sx and had just returned home 06/05/16. During his stay at SNF, he a subsequent admission for sepsis. 06/09/16 awaiting MRI fo neck    PT Comments    Pt having a difficult time given (at the time of treatment, he was NWB LE), had a poorly positioned and stiff neck held in flexion.  Pt needed to toilet, so emphasis placement on sitting balance EOB and transfers to/from chair and BSC.  Follow Up Recommendations  SNF     Equipment Recommendations  None recommended by PT    Recommendations for Other Services       Precautions / Restrictions Precautions Precautions: Fall Restrictions Weight Bearing Restrictions: Yes RLE Weight Bearing: Weight bearing as tolerated Other Position/Activity Restrictions:  (needs to have a cam walker boot)    Mobility  Bed Mobility Overal bed mobility: Needs Assistance;+2 for physical assistance Bed Mobility: Supine to Sit;Sit to Supine     Supine to sit: Max assist;+2 for physical assistance Sit to supine: Max assist;+2 for physical assistance   General bed mobility comments: Assist for LEs to EOB and for elevation of trunk.  Transfers Overall transfer level: Needs assistance Equipment used: 2 person hand held assist Transfers: Sit to/from Stand Sit to Stand: Max assist;+2 physical assistance Stand pivot transfers: Max assist;+2 physical assistance       General transfer comment: Max +2 for sit to stand from EOB x1, BSC x1.   Ambulation/Gait             General Gait Details: unable   Stairs            Wheelchair Mobility    Modified Rankin (Stroke Patients Only)        Balance Overall balance assessment: Needs assistance Sitting-balance support: Feet supported;Bilateral upper extremity supported Sitting balance-Leahy Scale: Poor Sitting balance - Comments: Max assist to maintain sitting balance Postural control: Posterior lean Standing balance support: Bilateral upper extremity supported Standing balance-Leahy Scale: Zero Standing balance comment: reliant on BUE for support                    Cognition Arousal/Alertness: Awake/alert Behavior During Therapy: Flat affect Overall Cognitive Status: Within Functional Limits for tasks assessed (Perseverating and requiring max encouragement)                      Exercises Other Exercises Other Exercises: Pt tolerated sitting EOB for trunk control exercises and gentle back/neck stretch for improved posture.    General Comments        Pertinent Vitals/Pain Pain Assessment: Faces Faces Pain Scale: Hurts even more Pain Location: neck Pain Descriptors / Indicators: Sore Pain Intervention(s): Monitored during session;Repositioned;Limited activity within patient's tolerance    Home Living                      Prior Function            PT Goals (current goals can now be found in the care plan section) Acute Rehab PT Goals Patient Stated Goal: get better PT Goal Formulation: With patient/family Time For Goal Achievement: 06/22/16 Potential to Achieve Goals:  Good Progress towards PT goals: Progressing toward goals    Frequency    Min 3X/week      PT Plan Current plan remains appropriate    Co-evaluation PT/OT/SLP Co-Evaluation/Treatment: Yes Reason for Co-Treatment: Complexity of the patient's impairments (multi-system involvement) PT goals addressed during session: Mobility/safety with mobility OT goals addressed during session: ADL's and self-care     End of Session   Activity Tolerance: Patient limited by fatigue Patient left: in chair;with call  bell/phone within reach;with chair alarm set     Time: [id number] PT Time Calculation (min) (ACUTE ONLY): 47 min  Charges:  $Therapeutic Activity: 8-22 mins                    G Codes:      Makeshia Seat, Tessie Fass 06/12/2016, 6:42 PM 06/12/2016  Donnella Sham, PT [phone]  (pager)

## 2016-06-12 NOTE — Progress Notes (Signed)
Occupational Therapy Treatment Patient Details Name: Benjamin JACOX Sr. MRN: [id number] DOB: 03-27-1946 Today's Date: 06/12/2016    History of present illness 70 y.o. male admitted following a fall at home where he sustained R fibular fx. PMH consists of vertigo, skin ca, HTN, gout, BPH, s/p cervical fusion 04/2016. He d/c'd to SNF for therapy following that sx and had just returned home 06/05/16. During his stay at SNF, he a subsequent admission for sepsis. 06/09/16 awaiting MRI fo neck   OT comments  Pt making gradual progress toward OT goals. With max verbal encouragement pt able to perform stand pivot transfer with max assist +2. Pt tolerated trunk control and gentle stretching exercises sitting EOB. D/c plan remains appropriate. Will continue to follow acutely.   Follow Up Recommendations  SNF;Supervision/Assistance - 24 hour    Equipment Recommendations  Other (comment) (TBD)    Recommendations for Other Services      Precautions / Restrictions Precautions Precautions: Fall Restrictions RLE Weight Bearing:  (unsure; maintained NWB)       Mobility Bed Mobility Overal bed mobility: Needs Assistance;+2 for physical assistance Bed Mobility: Supine to Sit;Sit to Supine     Supine to sit: Max assist;+2 for physical assistance Sit to supine: Max assist;+2 for physical assistance   General bed mobility comments: Assist for LEs to EOB and for elevation of trunk.  Transfers Overall transfer level: Needs assistance Equipment used: 2 person hand held assist Transfers: Sit to/from [facility] Sit to Stand: Max assist;+2 physical assistance Stand pivot transfers: Max assist;+2 physical assistance       General transfer comment: Max +2 for sit to stand from EOB x1, BSC x1.     Balance Overall balance assessment: Needs assistance Sitting-balance support: Feet supported;Bilateral upper extremity supported Sitting balance-Leahy Scale: Poor Sitting balance -  Comments: Max assist to maintain sitting balance Postural control: Posterior lean   Standing balance-Leahy Scale: Zero                     ADL Overall ADL's : Needs assistance/impaired                         Toilet Transfer: Maximal assistance;+2 for physical assistance;Stand-pivot;BSC   Toileting- Clothing Manipulation and Hygiene: Total assistance;+2 for physical assistance;Sit to/from stand Toileting - Clothing Manipulation Details (indicate cue type and reason): +2 for sit to stand     Functional mobility during ADLs: Maximal assistance;+2 for physical assistance (for stand pivot only) General ADL Comments: Max encouragement to participate. Perseverating on toileting throughout.      Vision                     Perception     Praxis      Cognition   Behavior During Therapy: Flat affect Overall Cognitive Status:  (Perseverating and requiring max encouragement)                       Extremity/Trunk Assessment               Exercises Other Exercises Other Exercises: Pt tolerated sitting EOB for trunk control exercises and gentle back/neck stretch for improved posture.   Shoulder Instructions       General Comments      Pertinent Vitals/ Pain       Pain Assessment: Faces Faces Pain Scale: Hurts even more  Home Living  Prior Functioning/Environment              Frequency  Min 2X/week        Progress Toward Goals  OT Goals(current goals can now be found in the care plan section)  Progress towards OT goals: Progressing toward goals  Acute Rehab OT Goals Patient Stated Goal: get better OT Goal Formulation: With patient/family  Plan Discharge plan remains appropriate    Co-evaluation    PT/OT/SLP Co-Evaluation/Treatment: Yes Reason for Co-Treatment: Complexity of the patient's impairments (multi-system involvement);For patient/therapist safety    OT goals addressed during session: ADL's and self-care      End of Session     Activity Tolerance Patient tolerated treatment well   Patient Left in chair;with call bell/phone within reach;with chair alarm set;with family/visitor present   Nurse Communication Mobility status        Time: [id number] OT Time Calculation (min): 47 min  Charges: OT General Charges $OT Visit: 1 Procedure OT Treatments $Self Care/Home Management : 23-37 mins  Binnie Kand M.S., OTR/L Pager: [phone]  06/12/2016, 5:24 PM

## 2016-06-12 NOTE — Clinical Social Work Note (Signed)
CSW and RNCM updated pt and family as Holli Humbles is pending and under clinical review. CSW shared that pt and family may have to look at other options for discharge such as paying privately or returning home with home health. RNCM explained home health options and private duty sitters. CSW provided supportive counseling. CSW will continue to follow.   Darden Dates, MSW, LCSW Clinical Social Worker  [phone]

## 2016-06-12 NOTE — Clinical Social Work Note (Signed)
CSW spoke with [employer] to inquire about [employer]. The Josem Kaufmann is under clinical review. CSW will continue to follow.   Darden Dates, MSW, LCSW Clinical Social Worker  [phone]

## 2016-06-12 NOTE — Discharge Summary (Signed)
Physician Discharge Summary  Benjamin BRUNELLE Sr. [id number] DOB: 03/30/1946 DOA: 06/06/2016  PCP: Marguerita Merles, MD  Admit date: 06/06/2016 Discharge date: 06/12/2016  Admitted From: Home Disposition:  SNF  Recommendations for Outpatient Follow-up:  1. Follow up with PCP in 1-2 weeks 2. Follow up with Dr. Sharol Given in the next 2 weeks 3. Participate in PT at facility 4. Please draw PSA at facility   Home Health: No Equipment/Devices:None  Discharge Condition: Stable CODE STATUS:Full Code Diet recommendation: Regular  Brief/Interim Summary: 70 y.o.malewith pmhx significant for BPH, hypertension, cervical stenosis status post repair in 04/2016 apparently doing well since his discharge to SNF but then began to slowly decline and spending more and more time in bed. He was subsequently discharged home from SNF. He fell the day prior to surgery. He did not have focal symptoms but per neurosurgery it is important to rule out cord compression.  Patient was cleared by neurosurgery.  Placement was decided upon to be [facility].  Was found to have fibular fracture on xray- discussed with Dr. Sharol Given who suggested getting PSA and weight bearing as tolerated.  Discharge Diagnoses:  Active Problems:   Essential hypertension   Weakness   Lower extremity weakness   BPH (benign prostatic hyperplasia)   Constipation   Post-op pain   GERD (gastroesophageal reflux disease)    Discharge Instructions  Discharge Instructions    Ambulatory referral to Orthopedic Surgery    Complete by:  As directed    Follow up in 1-2 weeks   Call MD for:  difficulty breathing, headache or visual disturbances    Complete by:  As directed    Call MD for:  extreme fatigue    Complete by:  As directed    Call MD for:  persistant dizziness or light-headedness    Complete by:  As directed    Call MD for:  persistant nausea and vomiting    Complete by:  As directed    Call MD for:  redness, tenderness, or  signs of infection (pain, swelling, redness, odor or green/yellow discharge around incision site)    Complete by:  As directed    Call MD for:  severe uncontrolled pain    Complete by:  As directed    Call MD for:  temperature >100.4    Complete by:  As directed    Diet general    Complete by:  As directed    Increase activity slowly    Complete by:  As directed        Medication List    STOP taking these medications   diazepam 5 MG tablet Commonly known as:  VALIUM     TAKE these medications   diclofenac 75 MG EC tablet Commonly known as:  VOLTAREN Take 75 mg by mouth 2 (two) times daily.   diltiazem 120 MG 24 hr capsule Commonly known as:  CARDIZEM CD Take 1 capsule (120 mg total) by mouth daily.   diphenhydrAMINE 25 MG tablet Commonly known as:  BENADRYL Take 25 mg by mouth every 6 (six) hours as needed for itching.   docusate sodium 100 MG capsule Commonly known as:  COLACE Take 1 capsule (100 mg total) by mouth 2 (two) times daily.   fluticasone 50 MCG/ACT nasal spray Commonly known as:  FLONASE Place 2 sprays into both nostrils daily as needed for allergies or rhinitis.   gabapentin 300 MG capsule Commonly known as:  NEURONTIN Take 1 capsule (300 mg total) by mouth  3 (three) times daily.   LINZESS 290 MCG Caps capsule Generic drug:  linaclotide Take 290 mcg by mouth daily as needed (constipation).   lisinopril 20 MG tablet Commonly known as:  PRINIVIL,ZESTRIL Take 1 tablet (20 mg total) by mouth daily.   omeprazole 40 MG capsule Commonly known as:  PRILOSEC Take 40 mg by mouth daily.   ondansetron 4 MG disintegrating tablet Commonly known as:  ZOFRAN ODT Take 1 tablet (4 mg total) by mouth every 8 (eight) hours as needed for nausea or vomiting.   oxyCODONE-acetaminophen 5-325 MG tablet Commonly known as:  PERCOCET/ROXICET Take 1-2 tablets by mouth every 8 (eight) hours as needed for severe pain.   polyethylene glycol packet Commonly known as:   MIRALAX Take 17 g by mouth daily. What changed:  when to take this  reasons to take this   scopolamine 1 MG/3DAYS Commonly known as:  TRANSDERM-SCOP Place 1 patch onto the skin every 3 (three) days.   simethicone 80 MG chewable tablet Commonly known as:  MYLICON Chew 1 tablet (80 mg total) by mouth 4 (four) times daily as needed for flatulence.   tamsulosin 0.4 MG Caps capsule Commonly known as:  FLOMAX Take 1 capsule (0.4 mg total) by mouth daily.       Contact information for follow-up providers    Newt Minion, MD. Schedule an appointment as soon as possible for a visit in 1 week(s).   Specialty:  Orthopedic Surgery Contact information: [place] [address] [phone]            Contact information for after-discharge care    [facility] .   Specialty:  [place] information: [place] [phone]                 Allergies  Allergen Reactions  . Codeine Nausea And Vomiting    Consultations:  Neurosurgery  PT/OT   Procedures/Studies: Dg Chest 1 View  Result Date: 05/20/2016 CLINICAL DATA:  Shortness of breath. EXAM: CHEST 1 VIEW COMPARISON:  Chest x-rays dated 05/19/2016 and 09/13/2015. FINDINGS: Study is hypoinspiratory. Given the low lung volumes, lungs are clear. No evidence of pneumonia or pulmonary edema. No pleural effusion or pneumothorax seen. Cardiomediastinal silhouette is stable. Osseous and soft tissue structures about the chest are unremarkable. IMPRESSION: Low lung volumes.  No active disease.  Stable mild cardiomegaly. Electronically Signed   By: Franki Cabot M.D.   On: 05/20/2016 18:11   Dg Ribs Unilateral W/chest Right  Result Date: 06/07/2016 CLINICAL DATA:  Fall with right rib pain.  Generalized weakness. EXAM: RIGHT RIBS AND CHEST - 3+ VIEW COMPARISON:  Chest radiograph 05/20/2016 FINDINGS: No  fracture or other bone lesions are seen involving the ribs. There is no evidence of pneumothorax or pleural effusion. Low lung volumes as before. Bibasilar atelectasis is similar. Stable mediastinal contours. IMPRESSION: No evidence of right rib fracture or acute abnormality. Low lung volumes with bibasilar atelectasis. Electronically Signed   By: Jeb Levering M.D.   On: 06/07/2016 01:37   Dg Cervical Spine With Flex & Extend  Result Date: 06/11/2016 CLINICAL DATA:  Cervical spondylosis with myelopathy EXAM: CERVICAL SPINE COMPLETE WITH FLEXION AND EXTENSION VIEWS COMPARISON:  06/11/2016 FINDINGS: Seven views of the cervical spine submitted including flexion-extension views. There is posterior fusion with metallic rods and transpedicular screws at C3, C4, C5 and C6 level. There is anatomic alignment. No  prevertebral soft tissue swelling. Cervical airway is patent. Mild degenerative changes C1-C2 articulation. Flexion-extension views shows no evidence of cervical instability. No acute fracture or subluxation. IMPRESSION: Posterior fusion at C3-C6 level with anatomic alignment. Flexion-extension views shows no evidence of cervical instability. No acute fracture or subluxation. Electronically Signed   By: Lahoma Crocker M.D.   On: 06/11/2016 15:46   Dg Ankle Complete Right  Result Date: 06/07/2016 CLINICAL DATA:  Right ankle pain after fall. EXAM: RIGHT ANKLE - COMPLETE 3+ VIEW COMPARISON:  None. FINDINGS: There is an oblique nondisplaced fracture of the distal fibula just proximal to the ankle mortise. No additional fracture. The mortise is preserved. Degenerative change noted in the included hind and midfoot. Plantar calcaneal spur. Vascular calcifications are seen. There is soft tissue edema. IMPRESSION: Oblique nondisplaced distal fibular fracture, proximal to the ankle mortise. Electronically Signed   By: Jeb Levering M.D.   On: 06/07/2016 01:39   Ct Cervical Spine Wo Contrast  Result Date:  06/07/2016 CLINICAL DATA:  Fall yesterday. Recent history of cervical spine fusion. Posterior neck pain. EXAM: CT CERVICAL SPINE WITHOUT CONTRAST TECHNIQUE: Multidetector CT imaging of the cervical spine was performed without intravenous contrast. Multiplanar CT image reconstructions were also generated. COMPARISON:  Cervical spine MRI 05/11/2016 FINDINGS: Alignment: No traumatic malalignment. The anterior arch of C1 remains high relative to the dens, and there is chronic posterior subluxation at the left C1-2 lateral articulation. Skull base and vertebrae: No acute fracture. C3 to C6 decompressive laminectomies with lateral mass screws and rods. No evidence of hardware displacement. Neighboring bone graft does not appear displaced. Soft tissues and spinal canal: Fluid in the laminectomy defect is poorly seen compared to previous MRI. No gross canal hematoma. No prevertebral edema. Disc levels:  Degenerative changes as recently characterized by MRI. Upper chest: No evidence of active disease. IMPRESSION: 1. No evidence of acute injury. 2. Uncomplicated appearance of recent C3-C6 posterior decompression. Electronically Signed   By: Monte Fantasia M.D.   On: 06/07/2016 07:54   Mr Cervical Spine Wo Contrast  Result Date: 06/11/2016 CLINICAL DATA:  70 year old male status post cervical fusion in September. Recent fall at home while transferring from wheelchair. Generalized bilateral extremity weakness. Initial encounter. Study performed under anesthesia. EXAM: MRI CERVICAL, THORACIC AND LUMBAR SPINE WITHOUT CONTRAST TECHNIQUE: Multiplanar and multiecho pulse sequences of the cervical spine, to include the craniocervical junction and cervicothoracic junction, and thoracic and lumbar spine, were obtained without intravenous contrast. COMPARISON:  Cervical spine CT 06/07/2016. Postoperative cervical spine MRI 05/11/2016, and earlier. CT Abdomen and Pelvis 05/19/2016 FINDINGS: MRI CERVICAL SPINE FINDINGS Alignment:  Sequelae of posterior decompression from the C3 to the C6 level with bilateral laminar hardware as demonstrated on the recent CT. Stable vertebral height and alignment since 05/11/2016, trace anterolisthesis of C4 on C5, C5 on C6, and C7 on T1. Vertebrae: No marrow edema or evidence of acute osseous abnormality. Cord: Questionable new small area of central spinal cord abnormal signal at the C6 level seen on series 3, image 8. This is difficult to correlate on the axial images. Elsewhere no Abnormal cervical spinal cord signal identified. Posterior Fossa, vertebral arteries, paraspinal tissues: Regressed postoperative fluid collection in the decompression surgical bed. Residual fluid is without mass effect on the posterior thecal sac. Overlying postoperative changes to the posterior paraspinal soft tissues. Mild hardware susceptibility artifact. Cervicomedullary junction is within normal limits. Negative visualized brain parenchyma. Major vascular flow voids in the neck are preserved, the left vertebral artery is  dominant and mildly ectatic. Hypo pharyngeal airway in place. Otherwise negative neck soft tissues. Disc levels: C2-C3: Thecal sac patency here appears mildly improved since 05/11/2016. There is mild multifactorial spinal stenosis in large part due to ligament flavum hypertrophy. C3-C4: Thecal sac patency here appears improved since the postoperative MRI. Circumferential disc osteophyte complex with uncovertebral and facet hypertrophy. Moderate to severe C4 foraminal stenosis appears stable. C4-C5: Thecal sac patency here appears stable since the postoperative MRI and satisfactory. Disc osteophyte complex and residual facet hypertrophy. Stable moderate left and moderate to severe right C5 foraminal stenosis. C5-C6: Thecal sac patency here appears stable or mildly decreased since the postoperative MRI. Right eccentric circumferential disc osteophyte complex appears stable. Moderate to severe right C6  foraminal stenosis appears stable. C6-C7: Thecal sac patency here appears decreased since the postoperative MRI, which seems related to interval moderate ligament flavum hypertrophy as seen on series 8, image 21. Mild disc osteophyte complex here appears stable. Mild to moderate right C7 foraminal stenosis appears stable. C7-T1: Stable thecal sac patency without spinal stenosis. Mild to moderate facet hypertrophy appears stable. Mild ligament flavum hypertrophy. Mild C8 foraminal stenosis appears stable. MRI THORACIC SPINE FINDINGS Alignment: Mildly exaggerated thoracic kyphosis. Minimal scoliotic curvature. No spondylolisthesis. Vertebrae: Normal bone marrow signal. No marrow edema or evidence of acute osseous abnormality. Cord: Spinal cord signal is within normal limits at all visualized levels. Generalized mild epidural lipomatosis. Paraspinal and other soft tissues: Small right greater than left pleural effusions. Right greater than left lower lobe pulmonary consolidation (series 16, image 22). Partially visible cardiomegaly. No pericardial effusion. Negative visualized upper abdominal viscera. Negative visualized posterior paraspinal soft tissues. Disc levels: Normal for age except: T1-T2: Mild to moderate facet hypertrophy greater on the right. T4-T5: Small central to left paracentral disc protrusion best seen on series 15, image 12. Narrowing of the ventral CSF space but no significant stenosis. Moderate facet hypertrophy greater on the right. Trace facet joint fluid. Moderate right T4 foraminal stenosis. T9-T10: Moderate facet hypertrophy. Epidural lipomatosis. No significant stenosis. T10-T11: Mild to moderate facet hypertrophy. No significant stenosis. MRI LUMBAR SPINE FINDINGS Segmentation:  Normal. Alignment:  Slight scoliosis. Vertebrae: No marrow edema or evidence of acute osseous abnormality. Conus medullaris: Extends to the L1 level and appears normal. Paraspinal and other soft tissues:  Diverticulosis in the left colon. Partially visible mild to moderate urinary bladder distention. Negative visualized posterior paraspinal soft tissues. Disc levels: L1-L2: Circumferential disc bulge and endplate spurring. Moderate facet and ligament flavum hypertrophy. Borderline to mild spinal stenosis. Mild left and moderate right L1 foraminal stenosis. L2-L3: Circumferential disc bulge and endplate spurring. Mild to moderate facet hypertrophy. Mild left and moderate right L2 foraminal stenosis. L3-L4: Circumferential disc bulge and endplate spurring. Increased T2 signal in the right aspect of the disc. No endplate marrow edema. Broad-based posterior component. Moderate facet and ligament flavum hypertrophy greater on the right. Moderate to severe right lateral recess stenosis. Mild to moderate right L3 foraminal stenosis. L4-L5: Circumferential disc bulge and endplate spurring. Mild to moderate facet hypertrophy. Moderate bilateral L4 foraminal stenosis. L5-S1: Disc space loss with circumferential disc bulge and endplate spurring. Broad-based left paracentral posterior component. Mild to moderate facet hypertrophy. Mild to moderate left lateral recess stenosis. Moderate left greater than right L5 foraminal stenosis. IMPRESSION: 1. Questionable small central cervical spinal cord signal abnormality now at C6, seen only on series 3, image 8. Although nonspecific given the clinical scenario perhaps this is a tiny spinal cord infarct. 2. Extensive  cervical spine posterior decompression and laminar fusion hardware placed as seen on the postoperative cervical spine MRI 05/11/2016. Decreased posterior postoperative fluid collection which is likely seroma and does not exert mass effect on the thecal sac. 3. Stable to improved cervical spine thecal sac patency since the postoperative MRI except at the C6-C7 level which appears related to interval ligament flavum hypertrophy. Multilevel degenerative cervical neural  foraminal stenosis appears stable. 4. Comparatively mild thoracic spine degeneration without significant thoracic spinal stenosis. Normal thoracic spinal cord and conus. Generalized thoracic spine epidural lipomatosis. 5. Fairly ordinary lumbar spine degeneration with only borderline to mild lumbar spinal stenosis. There is moderate to severe multifactorial lateral recess stenosis on the right at L3-L4. Abnormal signal in the right L3-L4 disc appears to be degenerative in nature. 6. Pleural effusions and right greater than left abnormal pulmonary lower lobes suggesting either pneumonia, bronchial obstruction ("drowned lung"), or atelectasis. Electronically Signed   By: Genevie Ann M.D.   On: 06/11/2016 12:31   Mr Thoracic Spine Wo Contrast  Result Date: 06/11/2016 CLINICAL DATA:  70 year old male status post cervical fusion in September. Recent fall at home while transferring from wheelchair. Generalized bilateral extremity weakness. Initial encounter. Study performed under anesthesia. EXAM: MRI CERVICAL, THORACIC AND LUMBAR SPINE WITHOUT CONTRAST TECHNIQUE: Multiplanar and multiecho pulse sequences of the cervical spine, to include the craniocervical junction and cervicothoracic junction, and thoracic and lumbar spine, were obtained without intravenous contrast. COMPARISON:  Cervical spine CT 06/07/2016. Postoperative cervical spine MRI 05/11/2016, and earlier. CT Abdomen and Pelvis 05/19/2016 FINDINGS: MRI CERVICAL SPINE FINDINGS Alignment: Sequelae of posterior decompression from the C3 to the C6 level with bilateral laminar hardware as demonstrated on the recent CT. Stable vertebral height and alignment since 05/11/2016, trace anterolisthesis of C4 on C5, C5 on C6, and C7 on T1. Vertebrae: No marrow edema or evidence of acute osseous abnormality. Cord: Questionable new small area of central spinal cord abnormal signal at the C6 level seen on series 3, image 8. This is difficult to correlate on the axial images.  Elsewhere no Abnormal cervical spinal cord signal identified. Posterior Fossa, vertebral arteries, paraspinal tissues: Regressed postoperative fluid collection in the decompression surgical bed. Residual fluid is without mass effect on the posterior thecal sac. Overlying postoperative changes to the posterior paraspinal soft tissues. Mild hardware susceptibility artifact. Cervicomedullary junction is within normal limits. Negative visualized brain parenchyma. Major vascular flow voids in the neck are preserved, the left vertebral artery is dominant and mildly ectatic. Hypo pharyngeal airway in place. Otherwise negative neck soft tissues. Disc levels: C2-C3: Thecal sac patency here appears mildly improved since 05/11/2016. There is mild multifactorial spinal stenosis in large part due to ligament flavum hypertrophy. C3-C4: Thecal sac patency here appears improved since the postoperative MRI. Circumferential disc osteophyte complex with uncovertebral and facet hypertrophy. Moderate to severe C4 foraminal stenosis appears stable. C4-C5: Thecal sac patency here appears stable since the postoperative MRI and satisfactory. Disc osteophyte complex and residual facet hypertrophy. Stable moderate left and moderate to severe right C5 foraminal stenosis. C5-C6: Thecal sac patency here appears stable or mildly decreased since the postoperative MRI. Right eccentric circumferential disc osteophyte complex appears stable. Moderate to severe right C6 foraminal stenosis appears stable. C6-C7: Thecal sac patency here appears decreased since the postoperative MRI, which seems related to interval moderate ligament flavum hypertrophy as seen on series 8, image 21. Mild disc osteophyte complex here appears stable. Mild to moderate right C7 foraminal stenosis appears stable. C7-T1: Stable thecal  sac patency without spinal stenosis. Mild to moderate facet hypertrophy appears stable. Mild ligament flavum hypertrophy. Mild C8 foraminal  stenosis appears stable. MRI THORACIC SPINE FINDINGS Alignment: Mildly exaggerated thoracic kyphosis. Minimal scoliotic curvature. No spondylolisthesis. Vertebrae: Normal bone marrow signal. No marrow edema or evidence of acute osseous abnormality. Cord: Spinal cord signal is within normal limits at all visualized levels. Generalized mild epidural lipomatosis. Paraspinal and other soft tissues: Small right greater than left pleural effusions. Right greater than left lower lobe pulmonary consolidation (series 16, image 22). Partially visible cardiomegaly. No pericardial effusion. Negative visualized upper abdominal viscera. Negative visualized posterior paraspinal soft tissues. Disc levels: Normal for age except: T1-T2: Mild to moderate facet hypertrophy greater on the right. T4-T5: Small central to left paracentral disc protrusion best seen on series 15, image 12. Narrowing of the ventral CSF space but no significant stenosis. Moderate facet hypertrophy greater on the right. Trace facet joint fluid. Moderate right T4 foraminal stenosis. T9-T10: Moderate facet hypertrophy. Epidural lipomatosis. No significant stenosis. T10-T11: Mild to moderate facet hypertrophy. No significant stenosis. MRI LUMBAR SPINE FINDINGS Segmentation:  Normal. Alignment:  Slight scoliosis. Vertebrae: No marrow edema or evidence of acute osseous abnormality. Conus medullaris: Extends to the L1 level and appears normal. Paraspinal and other soft tissues: Diverticulosis in the left colon. Partially visible mild to moderate urinary bladder distention. Negative visualized posterior paraspinal soft tissues. Disc levels: L1-L2: Circumferential disc bulge and endplate spurring. Moderate facet and ligament flavum hypertrophy. Borderline to mild spinal stenosis. Mild left and moderate right L1 foraminal stenosis. L2-L3: Circumferential disc bulge and endplate spurring. Mild to moderate facet hypertrophy. Mild left and moderate right L2 foraminal  stenosis. L3-L4: Circumferential disc bulge and endplate spurring. Increased T2 signal in the right aspect of the disc. No endplate marrow edema. Broad-based posterior component. Moderate facet and ligament flavum hypertrophy greater on the right. Moderate to severe right lateral recess stenosis. Mild to moderate right L3 foraminal stenosis. L4-L5: Circumferential disc bulge and endplate spurring. Mild to moderate facet hypertrophy. Moderate bilateral L4 foraminal stenosis. L5-S1: Disc space loss with circumferential disc bulge and endplate spurring. Broad-based left paracentral posterior component. Mild to moderate facet hypertrophy. Mild to moderate left lateral recess stenosis. Moderate left greater than right L5 foraminal stenosis. IMPRESSION: 1. Questionable small central cervical spinal cord signal abnormality now at C6, seen only on series 3, image 8. Although nonspecific given the clinical scenario perhaps this is a tiny spinal cord infarct. 2. Extensive cervical spine posterior decompression and laminar fusion hardware placed as seen on the postoperative cervical spine MRI 05/11/2016. Decreased posterior postoperative fluid collection which is likely seroma and does not exert mass effect on the thecal sac. 3. Stable to improved cervical spine thecal sac patency since the postoperative MRI except at the C6-C7 level which appears related to interval ligament flavum hypertrophy. Multilevel degenerative cervical neural foraminal stenosis appears stable. 4. Comparatively mild thoracic spine degeneration without significant thoracic spinal stenosis. Normal thoracic spinal cord and conus. Generalized thoracic spine epidural lipomatosis. 5. Fairly ordinary lumbar spine degeneration with only borderline to mild lumbar spinal stenosis. There is moderate to severe multifactorial lateral recess stenosis on the right at L3-L4. Abnormal signal in the right L3-L4 disc appears to be degenerative in nature. 6. Pleural  effusions and right greater than left abnormal pulmonary lower lobes suggesting either pneumonia, bronchial obstruction ("drowned lung"), or atelectasis. Electronically Signed   By: Genevie Ann M.D.   On: 06/11/2016 12:31   Mr Lumbar Spine Wo  Contrast  Result Date: 06/11/2016 CLINICAL DATA:  69 year old male status post cervical fusion in September. Recent fall at home while transferring from wheelchair. Generalized bilateral extremity weakness. Initial encounter. Study performed under anesthesia. EXAM: MRI CERVICAL, THORACIC AND LUMBAR SPINE WITHOUT CONTRAST TECHNIQUE: Multiplanar and multiecho pulse sequences of the cervical spine, to include the craniocervical junction and cervicothoracic junction, and thoracic and lumbar spine, were obtained without intravenous contrast. COMPARISON:  Cervical spine CT 06/07/2016. Postoperative cervical spine MRI 05/11/2016, and earlier. CT Abdomen and Pelvis 05/19/2016 FINDINGS: MRI CERVICAL SPINE FINDINGS Alignment: Sequelae of posterior decompression from the C3 to the C6 level with bilateral laminar hardware as demonstrated on the recent CT. Stable vertebral height and alignment since 05/11/2016, trace anterolisthesis of C4 on C5, C5 on C6, and C7 on T1. Vertebrae: No marrow edema or evidence of acute osseous abnormality. Cord: Questionable new small area of central spinal cord abnormal signal at the C6 level seen on series 3, image 8. This is difficult to correlate on the axial images. Elsewhere no Abnormal cervical spinal cord signal identified. Posterior Fossa, vertebral arteries, paraspinal tissues: Regressed postoperative fluid collection in the decompression surgical bed. Residual fluid is without mass effect on the posterior thecal sac. Overlying postoperative changes to the posterior paraspinal soft tissues. Mild hardware susceptibility artifact. Cervicomedullary junction is within normal limits. Negative visualized brain parenchyma. Major vascular flow voids in the  neck are preserved, the left vertebral artery is dominant and mildly ectatic. Hypo pharyngeal airway in place. Otherwise negative neck soft tissues. Disc levels: C2-C3: Thecal sac patency here appears mildly improved since 05/11/2016. There is mild multifactorial spinal stenosis in large part due to ligament flavum hypertrophy. C3-C4: Thecal sac patency here appears improved since the postoperative MRI. Circumferential disc osteophyte complex with uncovertebral and facet hypertrophy. Moderate to severe C4 foraminal stenosis appears stable. C4-C5: Thecal sac patency here appears stable since the postoperative MRI and satisfactory. Disc osteophyte complex and residual facet hypertrophy. Stable moderate left and moderate to severe right C5 foraminal stenosis. C5-C6: Thecal sac patency here appears stable or mildly decreased since the postoperative MRI. Right eccentric circumferential disc osteophyte complex appears stable. Moderate to severe right C6 foraminal stenosis appears stable. C6-C7: Thecal sac patency here appears decreased since the postoperative MRI, which seems related to interval moderate ligament flavum hypertrophy as seen on series 8, image 21. Mild disc osteophyte complex here appears stable. Mild to moderate right C7 foraminal stenosis appears stable. C7-T1: Stable thecal sac patency without spinal stenosis. Mild to moderate facet hypertrophy appears stable. Mild ligament flavum hypertrophy. Mild C8 foraminal stenosis appears stable. MRI THORACIC SPINE FINDINGS Alignment: Mildly exaggerated thoracic kyphosis. Minimal scoliotic curvature. No spondylolisthesis. Vertebrae: Normal bone marrow signal. No marrow edema or evidence of acute osseous abnormality. Cord: Spinal cord signal is within normal limits at all visualized levels. Generalized mild epidural lipomatosis. Paraspinal and other soft tissues: Small right greater than left pleural effusions. Right greater than left lower lobe pulmonary  consolidation (series 16, image 22). Partially visible cardiomegaly. No pericardial effusion. Negative visualized upper abdominal viscera. Negative visualized posterior paraspinal soft tissues. Disc levels: Normal for age except: T1-T2: Mild to moderate facet hypertrophy greater on the right. T4-T5: Small central to left paracentral disc protrusion best seen on series 15, image 12. Narrowing of the ventral CSF space but no significant stenosis. Moderate facet hypertrophy greater on the right. Trace facet joint fluid. Moderate right T4 foraminal stenosis. T9-T10: Moderate facet hypertrophy. Epidural lipomatosis. No significant stenosis. T10-T11: Mild  to moderate facet hypertrophy. No significant stenosis. MRI LUMBAR SPINE FINDINGS Segmentation:  Normal. Alignment:  Slight scoliosis. Vertebrae: No marrow edema or evidence of acute osseous abnormality. Conus medullaris: Extends to the L1 level and appears normal. Paraspinal and other soft tissues: Diverticulosis in the left colon. Partially visible mild to moderate urinary bladder distention. Negative visualized posterior paraspinal soft tissues. Disc levels: L1-L2: Circumferential disc bulge and endplate spurring. Moderate facet and ligament flavum hypertrophy. Borderline to mild spinal stenosis. Mild left and moderate right L1 foraminal stenosis. L2-L3: Circumferential disc bulge and endplate spurring. Mild to moderate facet hypertrophy. Mild left and moderate right L2 foraminal stenosis. L3-L4: Circumferential disc bulge and endplate spurring. Increased T2 signal in the right aspect of the disc. No endplate marrow edema. Broad-based posterior component. Moderate facet and ligament flavum hypertrophy greater on the right. Moderate to severe right lateral recess stenosis. Mild to moderate right L3 foraminal stenosis. L4-L5: Circumferential disc bulge and endplate spurring. Mild to moderate facet hypertrophy. Moderate bilateral L4 foraminal stenosis. L5-S1: Disc space  loss with circumferential disc bulge and endplate spurring. Broad-based left paracentral posterior component. Mild to moderate facet hypertrophy. Mild to moderate left lateral recess stenosis. Moderate left greater than right L5 foraminal stenosis. IMPRESSION: 1. Questionable small central cervical spinal cord signal abnormality now at C6, seen only on series 3, image 8. Although nonspecific given the clinical scenario perhaps this is a tiny spinal cord infarct. 2. Extensive cervical spine posterior decompression and laminar fusion hardware placed as seen on the postoperative cervical spine MRI 05/11/2016. Decreased posterior postoperative fluid collection which is likely seroma and does not exert mass effect on the thecal sac. 3. Stable to improved cervical spine thecal sac patency since the postoperative MRI except at the C6-C7 level which appears related to interval ligament flavum hypertrophy. Multilevel degenerative cervical neural foraminal stenosis appears stable. 4. Comparatively mild thoracic spine degeneration without significant thoracic spinal stenosis. Normal thoracic spinal cord and conus. Generalized thoracic spine epidural lipomatosis. 5. Fairly ordinary lumbar spine degeneration with only borderline to mild lumbar spinal stenosis. There is moderate to severe multifactorial lateral recess stenosis on the right at L3-L4. Abnormal signal in the right L3-L4 disc appears to be degenerative in nature. 6. Pleural effusions and right greater than left abnormal pulmonary lower lobes suggesting either pneumonia, bronchial obstruction ("drowned lung"), or atelectasis. Electronically Signed   By: Genevie Ann M.D.   On: 06/11/2016 12:31   Ct Abdomen Pelvis W Contrast  Result Date: 05/20/2016 CLINICAL DATA:  Generalized abdominal pain improved with passing flatus. History of neck fusion 2 weeks ago. EXAM: CT ABDOMEN AND PELVIS WITH CONTRAST TECHNIQUE: Multidetector CT imaging of the abdomen and pelvis was  performed using the standard protocol following bolus administration of intravenous contrast. CONTRAST:  140mL ISOVUE-300 IOPAMIDOL (ISOVUE-300) INJECTION 61% COMPARISON:  CT abdomen and pelvis Jan 16, 2016 FINDINGS: LOWER CHEST: Small bilateral pleural effusions and bibasilar atelectasis. Heart size is normal. Mild coronary artery calcifications. No pericardial effusions. HEPATOBILIARY: Liver demonstrates punctate calcification LEFT lobe compatible with granuloma. The hyper enhancing lesions in the liver from Jan 16, 2016 are not apparent on this examination. Normal gallbladder. Nonobstructing LEFT nephrolithiasis. PANCREAS: Normal. SPLEEN: Normal. ADRENALS/URINARY TRACT: Kidneys are orthotopic, demonstrating symmetric enhancement. 3 mm LEFT lower pole nephrolithiasis. LEFT renal cysts measure up to 2.4 cm. Too small to characterize hypodensities in the kidneys bilaterally. No hydronephrosis or solid renal masses. The unopacified ureters are normal in course and caliber. Delayed imaging through the kidneys demonstrates  symmetric prompt contrast excretion within the proximal urinary collecting system. Urinary bladder is partially distended and unremarkable. Normal adrenal glands. STOMACH/BOWEL: Markedly gas and contrast distended stomach. The small and large bowel are normal in course and caliber without inflammatory changes. Mild descending and sigmoid colon diverticulosis. Moderate amount of retained large bowel stool. Normal appendix. VASCULAR/LYMPHATIC: Aortoiliac vessels are normal in course and caliber, moderate calcific atherosclerosis. No lymphadenopathy by CT size criteria. REPRODUCTIVE: Prostate size is normal. OTHER: No intraperitoneal free fluid or free air. MUSCULOSKELETAL: Nonacute. Large fat containing LEFT inguinal hernia. Degenerative change of lumbar spine resulting in severe bilateral L5-S1 neural foraminal narrowing, moderate to severe RIGHT L3-4 and bilateral L4-5 neural foraminal narrowing.  IMPRESSION: Distended stomach. Decompressed small large bowel, moderate amount of retained large bowel stool. 3 mm nonobstructing LEFT nephrolithiasis. Electronically Signed   By: Elon Alas M.D.   On: 05/20/2016 00:49   Dg Chest Port 1 View  Result Date: 06/08/2016 CLINICAL DATA:  Shortness of breath EXAM: PORTABLE CHEST 1 VIEW COMPARISON:  June 07, 2016 FINDINGS: Mild bibasilar atelectasis. No other interval changes or acute abnormalities. No pneumothorax. IMPRESSION: Mild bibasilar atelectasis.  Low lung volumes. Electronically Signed   By: Dorise Bullion III M.D   On: 06/08/2016 19:11   Dg Chest Port 1 View  Result Date: 05/20/2016 CLINICAL DATA:  Abdominal pain, hypoxia. EXAM: PORTABLE CHEST 1 VIEW COMPARISON:  Chest radiograph September 13, 2015 FINDINGS: The cardiac silhouette is mildly enlarged, even with consideration to this low inspiratory portable examination with crowded vascular markings. Bibasilar strandy densities. No pleural effusion or focal consolidation. No pneumothorax. New cervical spine hardware. Multiple loops of gas distended bowel partially imaged. Osteopenia. IMPRESSION: Stable cardiomegaly and bibasilar atelectasis. Gas distended bowel incompletely imaged, recommend dedicated chest radiograph. Electronically Signed   By: Elon Alas M.D.   On: 05/20/2016 00:13      Subjective: Patient seen when laying in bed. States he has no symptoms when laying bed but his symptoms begin when he gets up and sits in a chair.  Reports that has a good appetite and no concerns currently with abdominal pain, chest pain, shortness of breath.  Awaiting for placement.  Discharge Exam: Vitals:   06/12/16 0549 06/12/16 1012  BP: 102/61 (!) 106/58  Pulse: 82 100  Resp: 16 17  Temp: 97.4 F (36.3 C) 98.4 F (36.9 C)   Vitals:   06/11/16 2156 06/12/16 0141 06/12/16 0549 06/12/16 1012  BP: (!) 142/64 (!) 102/57 102/61 (!) 106/58  Pulse: 100 86 82 100  Resp: 16 16 16 17    Temp: 98.9 F (37.2 C) 98.2 F (36.8 C) 97.4 F (36.3 C) 98.4 F (36.9 C)  TempSrc: Oral Oral Oral Oral  SpO2: 94% 99% 98% 99%  Weight:      Height:        General: Pt is alert, awake, not in acute distress Cardiovascular: RRR, S1/S2 +, no rubs, no gallops Respiratory: CTA bilaterally, no wheezing, no rhonchi Abdominal: Soft, NT, ND, bowel sounds + Extremities: no edema, no cyanosis    The results of significant diagnostics from this hospitalization (including imaging, microbiology, ancillary and laboratory) are listed below for reference.     Microbiology: No results found for this or any previous visit (from the past 240 hour(s)).   Labs: BNP (last 3 results) No results for input(s): BNP in the last 8760 hours. Basic Metabolic Panel:  Recent Labs Lab 06/07/16 0036 06/08/16 0254  NA 141 138  K 3.7 3.9  CL 101 100*  CO2 36* 33*  GLUCOSE 110* 94  BUN 17 14  CREATININE 0.64 0.58*  CALCIUM 9.5 9.3   Liver Function Tests:  Recent Labs Lab 06/07/16 0036  AST 13*  ALT 13*  ALKPHOS 80  BILITOT 0.4  PROT 5.4*  ALBUMIN 3.0*   No results for input(s): LIPASE, AMYLASE in the last 168 hours. No results for input(s): AMMONIA in the last 168 hours. CBC:  Recent Labs Lab 06/07/16 0036 06/08/16 0254  WBC 8.4 7.9  NEUTROABS 4.6  --   HGB 11.4* 10.3*  HCT 35.4* 32.3*  MCV 88.3 88.0  PLT 224 203   Cardiac Enzymes: No results for input(s): CKTOTAL, CKMB, CKMBINDEX, TROPONINI in the last 168 hours. BNP: Invalid input(s): POCBNP CBG: No results for input(s): GLUCAP in the last 168 hours. D-Dimer No results for input(s): DDIMER in the last 72 hours. Hgb A1c No results for input(s): HGBA1C in the last 72 hours. Lipid Profile No results for input(s): CHOL, HDL, LDLCALC, TRIG, CHOLHDL, LDLDIRECT in the last 72 hours. Thyroid function studies No results for input(s): TSH, T4TOTAL, T3FREE, THYROIDAB in the last 72 hours.  Invalid input(s): FREET3 Anemia  work up No results for input(s): VITAMINB12, FOLATE, FERRITIN, TIBC, IRON, RETICCTPCT in the last 72 hours. Urinalysis    Component Value Date/Time   COLORURINE YELLOW 06/07/2016 0245   APPEARANCEUR CLEAR 06/07/2016 0245   LABSPEC 1.013 06/07/2016 0245   PHURINE 7.0 06/07/2016 0245   GLUCOSEU NEGATIVE 06/07/2016 0245   HGBUR NEGATIVE 06/07/2016 0245   BILIRUBINUR NEGATIVE 06/07/2016 0245   KETONESUR NEGATIVE 06/07/2016 0245   PROTEINUR NEGATIVE 06/07/2016 0245   NITRITE NEGATIVE 06/07/2016 0245   LEUKOCYTESUR NEGATIVE 06/07/2016 0245   Sepsis Labs Invalid input(s): PROCALCITONIN,  WBC,  LACTICIDVEN Microbiology No results found for this or any previous visit (from the past 240 hour(s)).   Time coordinating discharge: Over 30 minutes  SIGNED:   Newman Pies, MD  [facility] 06/12/2016, 3:28 PM Pager [phone] If 7PM-7AM, please contact night-coverage [email or web address] Password [email or web address]

## 2016-06-13 NOTE — Care Management Note (Signed)
Case Management Note  Patient Details  Name: Benjamin MARSO Sr. MRN: [id number] Date of Birth: 05/11/1946  Subjective/Objective:                    Action/Plan: CM spoke with Dr Kathyrn Sheriff and no plans for surgery during this admission. Per CSW, continue to await authorization from [facility] for SNF placement for rehab. CM spoke to the patients wife about going home with [facility] services and equipment. Mrs Nishikawa very upset and crying stating she is not able to care for him at home. She asked if we could give her the cost of private paying at [facility] and she would sell their car to cover his rehab. CM informed CSW and they are going to check on the cost for private pay. CM following for further d/c needs.   Expected Discharge Date:  06/10/16               Expected Discharge Plan:  Skilled Nursing Facility  In-House Referral:  Clinical Social Work  Discharge planning Services     Post Acute Care Choice:    Choice offered to:     DME Arranged:    DME Agency:     HH Arranged:    [place] Agency:     Status of Service:  Completed, signed off  If discussed at [employer], dates discussed:    Additional Comments:  Pollie Friar, RN 06/13/2016, 7:46 AM

## 2016-06-26 ENCOUNTER — Ambulatory Visit (INDEPENDENT_AMBULATORY_CARE_PROVIDER_SITE_OTHER): Payer: Medicare HMO

## 2016-06-26 ENCOUNTER — Encounter (INDEPENDENT_AMBULATORY_CARE_PROVIDER_SITE_OTHER): Payer: Self-pay | Admitting: Orthopedic Surgery

## 2016-06-26 ENCOUNTER — Ambulatory Visit (INDEPENDENT_AMBULATORY_CARE_PROVIDER_SITE_OTHER): Payer: Medicare HMO | Admitting: Orthopedic Surgery

## 2016-06-26 VITALS — Ht 68.0 in | Wt 170.0 lb

## 2016-06-26 DIAGNOSIS — M25571 Pain in right ankle and joints of right foot: Secondary | ICD-10-CM

## 2016-06-26 DIAGNOSIS — S82444D Nondisplaced spiral fracture of shaft of right fibula, subsequent encounter for closed fracture with routine healing: Secondary | ICD-10-CM | POA: Diagnosis not present

## 2016-06-26 NOTE — Progress Notes (Signed)
Office Visit Note   Patient: Benjamin ERRINGTON Sr.           Date of Birth: [birth year]           MRN: [id number] Visit Date: 06/26/2016              Requested by: Marguerita Merles, MD [address] [place], [place] [address] PCP: Marguerita Merles, MD   Assessment & Plan: Visit Diagnoses:  1. Closed nondisplaced spiral fracture of shaft of right fibula with routine healing, subsequent encounter   2. Pain in right ankle and joints of right foot     Plan: Patient is currently in skilled nursing. Plan for physical therapy progressive ambulation gait training with fracture boot on the right. He may discontinue the fracture boot if he is not ambulating. Patient is to use the boot for 3 weeks. Patient family would like to follow-up as needed.  Follow-Up Instructions: Return if symptoms worsen or fail to improve.   Orders:  Orders Placed This Encounter  Procedures  . XR Ankle Complete Right   No orders of the defined types were placed in this encounter.     Procedures: No procedures performed   Clinical Data: No additional findings.   Subjective: Chief Complaint  Patient presents with  . Right Leg - Fracture    Distal fib fx s/p fall    DOI 06/06/16 Patient has cervical spine surgery and in a hard cervical collar now. He was in rehab for 3 weeks and was d/c home 4 hours after discharge pt fell at home and was sent to ER seen at [facility]. Pt had weakness and there was a question of a c spine infarct. The pt also had xrasy that revealed a nondisplaced distal fib fracture. The pt is now at [facility] facility and the pt's wife states that she is doing well and has regained a lot of functions that he "lost after surgery"  The pt has been partial weight bearing in a posterior splint and this has remained intact since the initial injury. The splint is removed and the skin is intact, slight swelling and the pt reports pain on the lateral side of the ankle.    Review of  Systems   Objective: Vital Signs: Ht 5\' 8"  (1.727 m)   Wt 170 lb (77.1 kg)   BMI 25.85 kg/m   Physical Exam Patient is alert oriented no adenopathy well-dressed normal affect normal respiratory effort.  Patient is ambulating in a wheelchair. Examination he does have some venous stasis swelling there is no cellulitis no open wound or drainage no signs of infection. The fibula is still tender to palpation.  Ortho Exam  Specialty Comments:  No specialty comments available.  Imaging: No results found.   PMFS History: Patient Active Problem List   Diagnosis Date Noted  . Closed fracture of shaft of right fibula   . Fall   . Lower extremity weakness 06/07/2016  . BPH (benign prostatic hyperplasia) 06/07/2016  . Constipation 06/07/2016  . Post-op pain 06/07/2016  . GERD (gastroesophageal reflux disease) 06/07/2016  . Sepsis ([place]) 05/20/2016  . Cervical spondylosis with myelopathy 05/09/2016  . Malnutrition of moderate degree 01/16/2016  . Weakness 01/14/2016  . Headache 01/14/2016  . Spinal cord compression ([place]) 09/14/2015  . Paresthesia of arm 09/14/2015  . Hyponatremia 09/14/2015  . Essential hypertension 09/14/2015  . Skin cancer 09/14/2015  . Debility 09/14/2015  . Right arm weakness 09/14/2015  . Nonspecific abnormal  electrocardiogram (ECG) (EKG) 09/14/2015  . Bradycardia 09/14/2015   Past Medical History:  Diagnosis Date  . Arthritis   . Blood clot associated with vein wall inflammation   . BPH (benign prostatic hypertrophy)   . Cancer ([place])   . Cervical pain (neck)   . GERD (gastroesophageal reflux disease)   . Gout   . Headache   . Heart murmur   . Hemorrhoids   . Hypertension   . Insomnia   . Nocturia associated with benign prostatic hypertrophy   . Numbness and tingling of right arm   . PONV (postoperative nausea and vomiting)    Problems tiliting and turning head  . Shortness of breath dyspnea    with exertion  . Squamous cell skin cancer     right ankle  . Stenosis of cervical spine with myelopathy   . Vertigo     Family History  Problem Relation Age of Onset  . CAD Father     Past Surgical History:  Procedure Laterality Date  . COLONOSCOPY WITH PROPOFOL N/A 02/19/2016   Procedure: COLONOSCOPY WITH PROPOFOL;  Surgeon: Manya Silvas, MD;  Location[facility] ENDOSCOPY;  Service: Endoscopy;  Laterality: N/A;  . ESOPHAGOGASTRODUODENOSCOPY (EGD) WITH PROPOFOL  02/19/2016   Procedure: ESOPHAGOGASTRODUODENOSCOPY (EGD) WITH PROPOFOL;  Surgeon: Manya Silvas, MD;  Location: [facility] ENDOSCOPY;  Service: Endoscopy;;  . LESION REMOVAL Right 10/26/2015   Procedure: EXCISION RIGHT ANKLE SKIN ;  Surgeon: Wallace Going, DO;  Location: [place];  Service: Plastics;  Laterality: Right;  . POSTERIOR CERVICAL FUSION/FORAMINOTOMY N/A 05/09/2016   Procedure: CERVICAL THREE-FOUR, CERVICAL FOUR-FIVE, CERIVCAL FIVE-SIX LAMINECTOMY WITH LATERAL MASS FUSION;  Surgeon: Consuella Lose, MD;  Location: [place] NEURO ORS;  Service: Neurosurgery;  Laterality: N/A;  LAMINECTOMY, LATERAL MASS FUSION C3-C6  . RADIOLOGY WITH ANESTHESIA N/A 06/11/2016   Procedure: MRI CERVICAL SPINE WITHOUT CONTRAST;  Surgeon: Medication Radiologist, MD;  Location: [place];  Service: Radiology;  Laterality: N/A;  . SKIN GRAFT Right 11/08/2015   Procedure: IRRIGATION AND DEBRIDEMENT AND SPLIT THICKNESS SKIN GRAFT RIGHT ANKLE ;  Surgeon: Wallace Going, DO;  Location: [place];  Service: Plastics;  Laterality: Right;   Social History   Occupational History  . Not on file.   Social History Main Topics  . Smoking status: Never Smoker  . Smokeless tobacco: Never Used  . Alcohol use No     Comment: rare beer  . Drug use: No  . Sexual activity: Not on file

## 2016-07-01 ENCOUNTER — Telehealth (INDEPENDENT_AMBULATORY_CARE_PROVIDER_SITE_OTHER): Payer: Self-pay | Admitting: Orthopedic Surgery

## 2016-07-01 NOTE — Telephone Encounter (Signed)
Wife left voice mail stating patient is in rehab.  He was put in a heavy boot at his appointment.  She found a lighter weight boot.  Is it okay for him to wear that instead? [phone]

## 2016-07-09 ENCOUNTER — Emergency Department
Admission: EM | Admit: 2016-07-09 | Discharge: 2016-07-09 | Disposition: A | Discharge status: Home / Self Care | Payer: Medicare HMO | Attending: Emergency Medicine | Admitting: Emergency Medicine

## 2016-07-09 ENCOUNTER — Encounter: Payer: Self-pay | Admitting: Medical Oncology

## 2016-07-09 ENCOUNTER — Emergency Department: Payer: Medicare HMO

## 2016-07-09 DIAGNOSIS — M62838 Other muscle spasm: Secondary | ICD-10-CM | POA: Diagnosis not present

## 2016-07-09 DIAGNOSIS — Z79899 Other long term (current) drug therapy: Secondary | ICD-10-CM | POA: Diagnosis not present

## 2016-07-09 DIAGNOSIS — M542 Cervicalgia: Secondary | ICD-10-CM | POA: Diagnosis present

## 2016-07-09 DIAGNOSIS — I10 Essential (primary) hypertension: Secondary | ICD-10-CM | POA: Insufficient documentation

## 2016-07-09 DIAGNOSIS — R51 Headache: Secondary | ICD-10-CM | POA: Insufficient documentation

## 2016-07-09 DIAGNOSIS — Z85828 Personal history of other malignant neoplasm of skin: Secondary | ICD-10-CM | POA: Insufficient documentation

## 2016-07-09 LAB — BASIC METABOLIC PANEL
ANION GAP: 5 (ref 5–15)
BUN: 15 mg/dL (ref 6–20)
CALCIUM: 9.7 mg/dL (ref 8.9–10.3)
CO2: 33 mmol/L — ABNORMAL HIGH (ref 22–32)
Chloride: 95 mmol/L — ABNORMAL LOW (ref 101–111)
Creatinine, Ser: 0.48 mg/dL — ABNORMAL LOW (ref 0.61–1.24)
GFR calc Af Amer: >60 mL/min (ref >60)
GLUCOSE: 93 mg/dL (ref 65–99)
POTASSIUM: 4.3 mmol/L (ref 3.5–5.1)
SODIUM: 133 mmol/L — AB (ref 135–145)

## 2016-07-09 LAB — URINALYSIS COMPLETE WITH MICROSCOPIC (ARMC ONLY)
BILIRUBIN URINE: NEGATIVE
Bacteria, UA: NONE SEEN
Glucose, UA: NEGATIVE mg/dL
Hgb urine dipstick: NEGATIVE
KETONES UR: NEGATIVE mg/dL
Leukocytes, UA: NEGATIVE
NITRITE: NEGATIVE
Protein, ur: NEGATIVE mg/dL
RBC / HPF: NONE SEEN RBC/hpf (ref 0–5)
SPECIFIC GRAVITY, URINE: 1.011 (ref 1.005–1.030)
Squamous Epithelial / LPF: NONE SEEN
pH: 7 (ref 5.0–8.0)

## 2016-07-09 LAB — CBC
HEMATOCRIT: 36.9 % — AB (ref 40.0–52.0)
HEMOGLOBIN: 12.2 g/dL — AB (ref 13.0–18.0)
MCH: 28.3 pg (ref 26.0–34.0)
MCHC: 33 g/dL (ref 32.0–36.0)
MCV: 85.9 fL (ref 80.0–100.0)
Platelets: 209 10*3/uL (ref 150–440)
RBC: 4.3 MIL/uL — ABNORMAL LOW (ref 4.40–5.90)
RDW: 14.9 % — AB (ref 11.5–14.5)
WBC: 8.1 10*3/uL (ref 3.8–10.6)

## 2016-07-09 LAB — TROPONIN I

## 2016-07-09 MED ORDER — ONDANSETRON HCL 4 MG/2ML IJ SOLN
4.0000 mg | Freq: Once | INTRAMUSCULAR | Status: AC
  Administered 2016-07-09: 4 mg via INTRAVENOUS
  Filled 2016-07-09: qty 2

## 2016-07-09 MED ORDER — CYCLOBENZAPRINE HCL 10 MG PO TABS
10.0000 mg | ORAL_TABLET | Freq: Once | ORAL | Status: AC
  Administered 2016-07-09: 10 mg via ORAL
  Filled 2016-07-09: qty 1

## 2016-07-09 MED ORDER — MORPHINE SULFATE (PF) 4 MG/ML IV SOLN
2.0000 mg | Freq: Once | INTRAVENOUS | Status: AC
  Administered 2016-07-09: 2 mg via INTRAVENOUS
  Filled 2016-07-09: qty 1

## 2016-07-09 MED ORDER — CYCLOBENZAPRINE HCL 10 MG PO TABS: 10.0000 mg | ORAL_TABLET | Freq: Three times a day (TID) | ORAL | 0 refills | Status: DC | PRN

## 2016-07-09 NOTE — ED Notes (Signed)
Pt complaining of not receiving any meds for pain. RN notified.

## 2016-07-09 NOTE — Discharge Instructions (Signed)
You were evaluated for left-sided neck and shoulder discomfort, and your exam and evaluation are reassuring for no emergency medical condition at this point time. If symptoms seem consistent with muscle spasm. As discussed, please discuss with the primary care doctor about physical therapy and I muscle spasm of the trapezius/neck and shoulder.  Return to the emergency department for any new or worsening pain, certainly any new weakness or numbness, any new or worsening headache or confusion or altered mental status, or any chest pain or trouble breathing or nausea or passing out.

## 2016-07-09 NOTE — ED Provider Notes (Signed)
[facility] Emergency Department Provider Note ____________________________________________   I have reviewed the triage vital signs and the triage nursing note.  HISTORY  Chief Complaint Neck Pain   Historian Patient  HPI Benjamin KURAMOTO Sr. is a 70 y.o. male with a history of elective C2-3 fusion with Dr. Kathyrn Sheriff at [facility] in September, reports he's had trouble with multiple pain and numbness issues since then.   States since surgery had ongoing numbness in the right arm, unchanged. States that for the last few weeks he's had severe pains of the left neck and shoulderblade, top of left shoulder and headache.  Patient and wife state was evaluated in [place] for similar complaints and had an MRI and showed "no problem."  He was at [facility] for several weeks but is at home now and is planning to have home health/physical therapy come help him with deconditioning in his lower extremities. Overnight he has the most problems with his uncontrolled pain. He takes oxycodone but states it doesn't really help anymore.  Denies palpitations or central chest pain. Denies nausea or vomiting. Denies abdominal pain. 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  Denies cough or fever.  Denies new weakness or numbness since last evaluated.    Past Medical History:  Diagnosis Date  . Arthritis   . Blood clot associated with vein wall inflammation   . BPH (benign prostatic hypertrophy)   . Cancer ([place])   . Cervical pain (neck)   . GERD (gastroesophageal reflux disease)   . Gout   . Headache   . Heart murmur   . Hemorrhoids   . Hypertension   . Insomnia   . Nocturia associated with benign prostatic hypertrophy   . Numbness and tingling of right arm   . PONV (postoperative nausea and vomiting)    Problems tiliting and turning head  . Shortness of breath dyspnea    with exertion  . Squamous cell skin cancer    right ankle  . Stenosis of cervical spine with myelopathy   . Vertigo     Patient Active Problem List   Diagnosis Date Noted  . Closed fracture of shaft of right fibula   . Fall   . Lower extremity weakness 06/07/2016  . BPH (benign prostatic hyperplasia) 06/07/2016  . Constipation 06/07/2016  . Post-op pain 06/07/2016  . GERD (gastroesophageal reflux disease) 06/07/2016  . Sepsis ([place]) 05/20/2016  . Cervical spondylosis with myelopathy 05/09/2016  . Malnutrition of moderate degree 01/16/2016  . Weakness 01/14/2016  . Headache 01/14/2016  . Spinal cord compression ([place]) 09/14/2015  . Paresthesia of arm 09/14/2015  . Hyponatremia 09/14/2015  . Essential hypertension 09/14/2015  . Skin cancer 09/14/2015  . Debility 09/14/2015  . Right arm weakness 09/14/2015  . Nonspecific abnormal electrocardiogram (ECG) (EKG) 09/14/2015  . Bradycardia 09/14/2015    Past Surgical History:  Procedure Laterality Date  . COLONOSCOPY WITH PROPOFOL N/A 02/19/2016   Procedure: COLONOSCOPY WITH PROPOFOL;  Surgeon: Manya Silvas, MD;  Location: [facility] ENDOSCOPY;  Service: Endoscopy;  Laterality: N/A;  . ESOPHAGOGASTRODUODENOSCOPY (EGD) WITH PROPOFOL  02/19/2016   Procedure: ESOPHAGOGASTRODUODENOSCOPY (EGD) WITH PROPOFOL;  Surgeon: Manya Silvas, MD;  Location: [facility] ENDOSCOPY;  Service: Endoscopy;;  . LESION REMOVAL Right 10/26/2015   Procedure: EXCISION RIGHT ANKLE SKIN ;  Surgeon: Wallace Going, DO;  Location: [place];  Service: Plastics;  Laterality: Right;  . POSTERIOR CERVICAL FUSION/FORAMINOTOMY N/A  05/09/2016   Procedure: CERVICAL THREE-FOUR, CERVICAL FOUR-FIVE, CERIVCAL FIVE-SIX LAMINECTOMY WITH LATERAL MASS FUSION;  Surgeon: Consuella Lose, MD;  Location: [place] NEURO ORS;  Service: Neurosurgery;  Laterality: N/A;  LAMINECTOMY, LATERAL MASS FUSION C3-C6  . RADIOLOGY WITH ANESTHESIA N/A 06/11/2016   Procedure: MRI CERVICAL SPINE WITHOUT CONTRAST;  Surgeon: Medication  Radiologist, MD;  Location: [place];  Service: Radiology;  Laterality: N/A;  . SKIN GRAFT Right 11/08/2015   Procedure: IRRIGATION AND DEBRIDEMENT AND SPLIT THICKNESS SKIN GRAFT RIGHT ANKLE ;  Surgeon: Wallace Going, DO;  Location: [place];  Service: Plastics;  Laterality: Right;    Prior to Admission medications   Medication Sig Start Date End Date Taking? Authorizing Provider  amLODipine (NORVASC) 5 MG tablet Take 5 mg by mouth once.   Yes Historical Provider, MD  diclofenac (VOLTAREN) 75 MG EC tablet Take 75 mg by mouth 2 (two) times daily.   Yes Historical Provider, MD  LINZESS 290 MCG CAPS capsule Take 290 mcg by mouth daily as needed (constipation).    Yes Historical Provider, MD  lisinopril (PRINIVIL,ZESTRIL) 20 MG tablet Take 1 tablet (20 mg total) by mouth daily. 01/16/16  Yes Henreitta Leber, MD  omeprazole (PRILOSEC) 40 MG capsule Take 40 mg by mouth daily.   Yes Historical Provider, MD  ondansetron (ZOFRAN ODT) 4 MG disintegrating tablet Take 1 tablet (4 mg total) by mouth every 8 (eight) hours as needed for nausea or vomiting. 12/03/15  Yes Anne-Caroline Mariea Clonts, MD  oxyCODONE-acetaminophen (PERCOCET/ROXICET) 5-325 MG tablet Take 1-2 tablets by mouth every 8 (eight) hours as needed for severe pain. 05/21/16  Yes Dustin Flock, MD  tamsulosin (FLOMAX) 0.4 MG CAPS capsule Take 1 capsule (0.4 mg total) by mouth daily. 01/16/16  Yes Henreitta Leber, MD  cyclobenzaprine (FLEXERIL) 10 MG tablet Take 1 tablet (10 mg total) by mouth 3 (three) times daily as needed for muscle spasms. 07/09/16   Lisa Roca, MD    Allergies  Allergen Reactions  . Codeine Nausea And Vomiting    Family History  Problem Relation Age of Onset  . CAD Father     Social History Social History  Substance Use Topics  . Smoking status: Never Smoker  . Smokeless tobacco: Never Used  . Alcohol use No     Comment: rare beer    Review of Systems  Constitutional: Negative for fever. Eyes:  Negative for visual changes. ENT: Negative for sore throat. Cardiovascular: Negative for pleuritic chest pain.  Some discomfort at top of left shoulder/upper chest. Respiratory: Negative for shortness of breath. Gastrointestinal: Negative for abdominal pain, vomiting and diarrhea. Genitourinary: Negative for dysuria. Musculoskeletal: Negative for back pain. Skin: Negative for rash. Neurological: Positive for gradual onset headache, posterior occiput. 10 point Review of Systems otherwise negative ____________________________________________   PHYSICAL EXAM:  VITAL SIGNS: ED Triage Vitals  Enc Vitals Group     BP 07/09/16 1021 (!) 142/75     Pulse Rate 07/09/16 1021 93     Resp 07/09/16 1021 17     Temp 07/09/16 1021 97.9 F (36.6 C)     Temp Source 07/09/16 1021 Oral     SpO2 07/09/16 1021 97 %     Weight 07/09/16 1023 170 lb (77.1 kg)     Height 07/09/16 1023 5\' 8"  (1.727 m)     Head Circumference --      Peak Flow --      Pain Score 07/09/16 1023 8  Pain Loc --      Pain Edu? --      Excl. in [place]? --      Constitutional: Alert and oriented. Well appearing but winces in pain at times due to pain at left shoulder and neck. HEENT   Head: Normocephalic and atraumatic.      Eyes: Conjunctivae are normal. PERRL. Normal extraocular movements.      Ears:         Nose: No congestion/rhinnorhea.   Mouth/Throat: Mucous membranes are moist.   Neck: No stridor.  No midline cervical spine tenderness.  Very tender palpable muscle spasm of the trapezius at occiput, paraspinous area, and top of shoulder. Cardiovascular/Chest: Normal rate, regular rhythm.  No murmurs, rubs, or gallops. Respiratory: Normal respiratory effort without tachypnea nor retractions. Breath sounds are clear and equal bilaterally. No wheezes/rales/rhonchi. Gastrointestinal: Soft. No distention, no guarding, no rebound. Nontender.    Genitourinary/rectal:Deferred Musculoskeletal: Very tender and  palpable muscle spams along medial border of left scapula, the trapezius along the neck and shoulder on the left. Neurologic:  Normal speech and language. No focal weakness, somewhat generally weak all over.  Parasthesia right arm. Skin:  Skin is warm, dry and intact. No rash noted. Psychiatric: Mood and affect are normal. Speech and behavior are normal. Patient exhibits appropriate insight and judgment.   ____________________________________________  LABS (pertinent positives/negatives)  Labs Reviewed  BASIC METABOLIC PANEL - Abnormal; Notable for the following:       Result Value   Sodium 133 (*)    Chloride 95 (*)    CO2 33 (*)    Creatinine, Ser 0.48 (*)    All other components within normal limits  CBC - Abnormal; Notable for the following:    RBC 4.30 (*)    Hemoglobin 12.2 (*)    HCT 36.9 (*)    RDW 14.9 (*)    All other components within normal limits  URINALYSIS COMPLETEWITH MICROSCOPIC ([facility] ONLY) - Abnormal; Notable for the following:    Color, Urine YELLOW (*)    APPearance CLEAR (*)    All other components within normal limits  TROPONIN I    ____________________________________________    EKG I, Lisa Roca, MD, the attending physician have personally viewed and interpreted all ECGs.  88 beats per minute. Normal sinus rhythm. Narrow QRS. Normal axis. Normal ST and T-wave ____________________________________________  RADIOLOGY All Xrays were viewed by me. Imaging interpreted by Radiologist.  Chest x-ray two-view:IMPRESSION: Low lung volumes with bibasilar atelectasis, similar to prior study. Small bilateral effusions. __________________________________________  PROCEDURES  Procedure(s) performed: None  Critical Care performed: None  ____________________________________________   ED COURSE / ASSESSMENT AND PLAN  Pertinent labs & imaging results that were available during my care of the patient were reviewed by me and considered in my medical  decision making (see chart for details).   Mr. Veeder is here with his wife for left-sided neck pain that extends down to his shoulder blade and across the top of his left shoulder and on exam he has very significant tender palpable muscle spasm of the trapezius.  I think his symptoms of headache starting at the occiput and pain down into the shoulder are all due to his muscle spasm. No additional cardiac symptoms and EKG is reassuring as well as troponin negative and ongoing symptoms overnight. I don't think he is having new symptoms related to neurologic emergency nor nor intracranial or vascular emergency.  Patient is given symptomatic relief after Flexeril and pain medication.  Patient is given outpatient follow-up and I have asked him to see his primary care doctor.    CONSULTATIONS:   None   Patient / Family / Caregiver informed of clinical course, medical decision-making process, and agree with plan.   I discussed return precautions, follow-up instructions, and discharge instructions with patient and/or family.   ___________________________________________   FINAL CLINICAL IMPRESSION(S) / ED DIAGNOSES   Final diagnoses:  Trapezius muscle spasm              Note: This dictation was prepared with Dragon dictation. Any transcriptional errors that result from this process are unintentional    Lisa Roca, MD 07/09/16 1441

## 2016-07-09 NOTE — ED Triage Notes (Signed)
Pt here from home via EMS with c/o neck pain that radiates into bilateral arms since September. Pt is on 2L chronic oxygen at home, pt denies any chest pain or shortness of breath.

## 2016-07-09 NOTE — ED Notes (Signed)
Pt assisted with urinal in room. 

## 2016-11-21 ENCOUNTER — Emergency Department: Payer: Medicare HMO

## 2016-11-21 ENCOUNTER — Emergency Department
Admission: EM | Admit: 2016-11-21 | Discharge: 2016-11-21 | Disposition: A | Discharge status: Home / Self Care | Payer: Medicare HMO | Attending: Emergency Medicine | Admitting: Emergency Medicine

## 2016-11-21 DIAGNOSIS — R339 Retention of urine, unspecified: Secondary | ICD-10-CM | POA: Insufficient documentation

## 2016-11-21 DIAGNOSIS — J9811 Atelectasis: Secondary | ICD-10-CM | POA: Insufficient documentation

## 2016-11-21 DIAGNOSIS — Z85828 Personal history of other malignant neoplasm of skin: Secondary | ICD-10-CM | POA: Diagnosis not present

## 2016-11-21 DIAGNOSIS — R0602 Shortness of breath: Secondary | ICD-10-CM

## 2016-11-21 DIAGNOSIS — R3989 Other symptoms and signs involving the genitourinary system: Secondary | ICD-10-CM

## 2016-11-21 DIAGNOSIS — R51 Headache: Secondary | ICD-10-CM | POA: Diagnosis not present

## 2016-11-21 DIAGNOSIS — I10 Essential (primary) hypertension: Secondary | ICD-10-CM | POA: Insufficient documentation

## 2016-11-21 DIAGNOSIS — R519 Headache, unspecified: Secondary | ICD-10-CM

## 2016-11-21 LAB — URINALYSIS, COMPLETE (UACMP) WITH MICROSCOPIC
BACTERIA UA: NONE SEEN
Bilirubin Urine: NEGATIVE
GLUCOSE, UA: NEGATIVE mg/dL
Hgb urine dipstick: NEGATIVE
Ketones, ur: NEGATIVE mg/dL
LEUKOCYTES UA: NEGATIVE
Nitrite: NEGATIVE
PH: 9 — AB (ref 5.0–8.0)
Protein, ur: NEGATIVE mg/dL
RBC / HPF: NONE SEEN RBC/hpf (ref 0–5)
SPECIFIC GRAVITY, URINE: 1.008 (ref 1.005–1.030)
SQUAMOUS EPITHELIAL / LPF: NONE SEEN

## 2016-11-21 LAB — CBC
HEMATOCRIT: 36.2 % — AB (ref 40.0–52.0)
HEMOGLOBIN: 12.2 g/dL — AB (ref 13.0–18.0)
MCH: 28.6 pg (ref 26.0–34.0)
MCHC: 33.6 g/dL (ref 32.0–36.0)
MCV: 85.2 fL (ref 80.0–100.0)
Platelets: 192 10*3/uL (ref 150–440)
RBC: 4.25 MIL/uL — ABNORMAL LOW (ref 4.40–5.90)
RDW: 14.2 % (ref 11.5–14.5)
WBC: 7.1 10*3/uL (ref 3.8–10.6)

## 2016-11-21 LAB — FIBRIN DERIVATIVES D-DIMER (ARMC ONLY): FIBRIN DERIVATIVES D-DIMER (ARMC): 924.91 — AB (ref 0.00–499.00)

## 2016-11-21 LAB — BASIC METABOLIC PANEL
ANION GAP: 6 (ref 5–15)
BUN: 10 mg/dL (ref 6–20)
CALCIUM: 9.7 mg/dL (ref 8.9–10.3)
CO2: 35 mmol/L — ABNORMAL HIGH (ref 22–32)
Chloride: 96 mmol/L — ABNORMAL LOW (ref 101–111)
Creatinine, Ser: 0.53 mg/dL — ABNORMAL LOW (ref 0.61–1.24)
GFR calc Af Amer: >60 mL/min (ref >60)
Glucose, Bld: 99 mg/dL (ref 65–99)
POTASSIUM: 3.5 mmol/L (ref 3.5–5.1)
SODIUM: 137 mmol/L (ref 135–145)

## 2016-11-21 LAB — TROPONIN I

## 2016-11-21 LAB — BRAIN NATRIURETIC PEPTIDE: B NATRIURETIC PEPTIDE 5: 42 pg/mL (ref 0.0–100.0)

## 2016-11-21 MED ORDER — AZITHROMYCIN 250 MG PO TABS: ORAL_TABLET | ORAL | 0 refills | Status: AC

## 2016-11-21 MED ORDER — ACETAMINOPHEN 325 MG PO TABS
650.0000 mg | ORAL_TABLET | Freq: Once | ORAL | Status: AC
  Administered 2016-11-21: 650 mg via ORAL
  Filled 2016-11-21: qty 2

## 2016-11-21 MED ORDER — PREDNISONE 20 MG PO TABS: 60.0000 mg | ORAL_TABLET | Freq: Every day | ORAL | 0 refills | Status: DC

## 2016-11-21 MED ORDER — IOPAMIDOL (ISOVUE-370) INJECTION 76%
75.0000 mL | Freq: Once | INTRAVENOUS | Status: AC | PRN
  Administered 2016-11-21: 75 mL via INTRAVENOUS

## 2016-11-21 NOTE — ED Triage Notes (Signed)
Per EMS, pt called out for shortness of breath, headache and urinary retention.  Pt tender in bladder area and states that he feels like he is not emptying bladder.  Pt breathing even and nonlabored.  Pt wears 2LNC chronic and ems states that his O2 sat was 97% in route.

## 2016-11-21 NOTE — ED Notes (Signed)
Dr. Mariea Clonts aware of elevated D-Dimer.

## 2016-11-21 NOTE — Discharge Instructions (Signed)
Please take the entire course of antibiotics and steroids to help with your shortness of breath.  Return to the emergency department if you develop shortness of breath, fever, chest pain, or any other symptoms concerning to you.

## 2016-11-21 NOTE — ED Notes (Signed)
Pt c/o burning and pain when he urinates, been going on for weeks. Will obtain a urine specimen and request md for an order. Pt used urinal at the bedside.

## 2016-11-21 NOTE — ED Provider Notes (Signed)
This patient was signed out to me by Dr. Marjean Donna. 72 year old male with multiple chronic illnesses presenting with multiple chronic complaints including shortness of breath, difficulty urinating, chest pain, headache, and chronic back pain. We have addressed his acute symptoms and have not found any emergent illnesses at this time. The patient is hemodynamically stable although chronically ill. I have personally spoken with the patient and encouraged him and his family members to contact their primary care physician for continued management of his chronic illnesses. The symptom that bothers him the most is the back pain; he already has a pain specialist for this. His CT angiogram did not find any pulmonary embolus. He has signs of atelectasis versus possible infiltrate, although he has been afebrile, has no elevation in his white blood cell count, and has not had any recent change in cough. I will treat him with a Z-Pak and 5 days of steroids in case this is early infection, and have him follow-up with his primary care physician. The patient has typical for discharge at this time.   Eula Listen, MD 11/21/16 1029

## 2016-11-21 NOTE — ED Provider Notes (Signed)
[facility] Emergency Department Provider Note    First MD Initiated Contact with Patient 11/21/16 [phone]     (approximate)  I have reviewed the triage vital signs and the nursing notes.   HISTORY  Chief Complaint Shortness of Breath and Urinary Retention   HPI Benjamin YUAN Sr. is a 71 y.o. male presents via [facility] with complaint of progressive dyspnea which started last night. In addition patient admits to pelvic discomfort and inability to completely empty his bladder. Patient states that he feels a sensation urinalysis is unable to do so. Regarding patient's dyspnea EMS states on their arrival patient on 2 L oxygen nasal cannula satting 97% and his remain as such throughout transport. Patient states dyspnea is improved at this time. Patient denies any fever  Past Medical History:  Diagnosis Date  . Arthritis   . Blood clot associated with vein wall inflammation   . BPH (benign prostatic hypertrophy)   . Cancer ([place])   . Cervical pain (neck)   . GERD (gastroesophageal reflux disease)   . Gout   . Headache   . Heart murmur   . Hemorrhoids   . Hypertension   . Insomnia   . Nocturia associated with benign prostatic hypertrophy   . Numbness and tingling of right arm   . PONV (postoperative nausea and vomiting)    Problems tiliting and turning head  . Shortness of breath dyspnea    with exertion  . Squamous cell skin cancer    right ankle  . Stenosis of cervical spine with myelopathy   . Vertigo     Patient Active Problem List   Diagnosis Date Noted  . Closed fracture of shaft of right fibula   . Fall   . Lower extremity weakness 06/07/2016  . BPH (benign prostatic hyperplasia) 06/07/2016  . Constipation 06/07/2016  . Post-op pain 06/07/2016  . GERD (gastroesophageal reflux disease) 06/07/2016  . Sepsis ([place]) 05/20/2016  . Cervical spondylosis with myelopathy 05/09/2016  . Malnutrition of moderate degree 01/16/2016  .  Weakness 01/14/2016  . Headache 01/14/2016  . 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 Paresthesia of arm 09/14/2015  . Hyponatremia 09/14/2015  . Essential hypertension 09/14/2015  . Skin cancer 09/14/2015  . Debility 09/14/2015  . Right arm weakness 09/14/2015  . Nonspecific abnormal electrocardiogram (ECG) (EKG) 09/14/2015  . Bradycardia 09/14/2015    Past Surgical History:  Procedure Laterality Date  . COLONOSCOPY WITH PROPOFOL N/A 02/19/2016   Procedure: COLONOSCOPY WITH PROPOFOL;  Surgeon: Manya Silvas, MD;  Location[facility] ENDOSCOPY;  Service: Endoscopy;  Laterality: N/A;  . ESOPHAGOGASTRODUODENOSCOPY (EGD) WITH PROPOFOL  02/19/2016   Procedure: ESOPHAGOGASTRODUODENOSCOPY (EGD) WITH PROPOFOL;  Surgeon: Manya Silvas, MD;  Location: [facility] ENDOSCOPY;  Service: Endoscopy;;  . LESION REMOVAL Right 10/26/2015   Procedure: EXCISION RIGHT ANKLE SKIN ;  Surgeon: Wallace Going, DO;  Location: [place];  Service: Plastics;  Laterality: Right;  . POSTERIOR CERVICAL FUSION/FORAMINOTOMY N/A 05/09/2016   Procedure: CERVICAL THREE-FOUR, CERVICAL FOUR-FIVE, CERIVCAL FIVE-SIX LAMINECTOMY WITH LATERAL MASS FUSION;  Surgeon: Consuella Lose, MD;  Location: [place] NEURO ORS;  Service: Neurosurgery;  Laterality: N/A;  LAMINECTOMY, LATERAL MASS FUSION C3-C6  . RADIOLOGY WITH ANESTHESIA N/A 06/11/2016   Procedure: MRI CERVICAL SPINE WITHOUT CONTRAST;  Surgeon: Medication Radiologist, MD;  Location: [place];  Service: Radiology;  Laterality: N/A;  . SKIN GRAFT Right 11/08/2015   Procedure: IRRIGATION AND DEBRIDEMENT AND SPLIT THICKNESS SKIN GRAFT RIGHT ANKLE ;  Surgeon: Wallace Going, DO;  Location: [place];  Service: Plastics;  Laterality: Right;    Prior to Admission medications   Medication Sig Start Date End Date Taking? Authorizing Provider  amLODipine (NORVASC) 5 MG tablet Take 5 mg by mouth once.    Historical Provider, MD  cyclobenzaprine (FLEXERIL)  10 MG tablet Take 1 tablet (10 mg total) by mouth 3 (three) times daily as needed for muscle spasms. 07/09/16   Lisa Roca, MD  diclofenac (VOLTAREN) 75 MG EC tablet Take 75 mg by mouth 2 (two) times daily.    Historical Provider, MD  LINZESS 290 MCG CAPS capsule Take 290 mcg by mouth daily as needed (constipation).     Historical Provider, MD  lisinopril (PRINIVIL,ZESTRIL) 20 MG tablet Take 1 tablet (20 mg total) by mouth daily. 01/16/16   Henreitta Leber, MD  omeprazole (PRILOSEC) 40 MG capsule Take 40 mg by mouth daily.    Historical Provider, MD  ondansetron (ZOFRAN ODT) 4 MG disintegrating tablet Take 1 tablet (4 mg total) by mouth every 8 (eight) hours as needed for nausea or vomiting. 12/03/15   Eula Listen, MD  oxyCODONE-acetaminophen (PERCOCET/ROXICET) 5-325 MG tablet Take 1-2 tablets by mouth every 8 (eight) hours as needed for severe pain. 05/21/16   Dustin Flock, MD  tamsulosin (FLOMAX) 0.4 MG CAPS capsule Take 1 capsule (0.4 mg total) by mouth daily. 01/16/16   Henreitta Leber, MD    Allergies Codeine  Family History  Problem Relation Age of Onset  . CAD Father     Social History Social History  Substance Use Topics  . Smoking status: Never Smoker  . Smokeless tobacco: Never Used  . Alcohol use No     Comment: rare beer    Review of Systems Constitutional: No fever/chills Eyes: No visual changes. ENT: No sore throat. Cardiovascular: Denies chest pain. Respiratory: Positive for shortness of breath. Gastrointestinal: No abdominal pain.  No nausea, no vomiting.  No diarrhea.  No constipation. Genitourinary: Negative for dysuria. Positive for urinary retention Musculoskeletal: Negative for back pain. Skin: Negative for rash. Neurological: Negative for headaches, focal weakness or numbness.  10-point ROS otherwise negative.  ____________________________________________   PHYSICAL EXAM:  VITAL SIGNS: ED Triage Vitals  Enc Vitals Group     BP 11/21/16  0637 (!) 152/98     Pulse Rate 11/21/16 0637 (!) 102     Resp 11/21/16 0637 (!) 22     Temp 11/21/16 0637 98.1 F (36.7 C)     Temp Source 11/21/16 0637 Oral     SpO2 11/21/16 0637 96 %     Weight 11/21/16 0638 160 lb (72.6 kg)     Height 11/21/16 0638 5\' 8"  (1.727 m)     Head Circumference --      Peak Flow --      Pain Score 11/21/16 0637 8     Pain Loc --      Pain Edu? --      Excl. in [place]? --     Constitutional: Alert and oriented. Well appearing and in no acute distress. Eyes: Conjunctivae are normal. PERRL. EOMI. Head: Atraumatic. Mouth/Throat: Mucous membranes are moist.  Oropharynx non-erythematous. Neck: No stridor.   Cardiovascular: Normal rate, regular rhythm. Good peripheral circulation. Grossly normal heart sounds. Respiratory: Normal respiratory effort.  No retractions. Lungs CTAB. Gastrointestinal: Soft and nontender. No distention.  Musculoskeletal: No lower extremity tenderness nor edema. No gross deformities of extremities. Neurologic:  Normal speech and  language. No gross focal neurologic deficits are appreciated.  Skin:  Skin is warm, dry and intact. No rash noted. Psychiatric: Mood and affect are normal. Speech and behavior are normal.  ____________________________________________   LABS (all labs ordered are listed, but only abnormal results are displayed)  Labs Reviewed  BASIC METABOLIC PANEL  CBC  TROPONIN I  BRAIN NATRIURETIC PEPTIDE   ____________________________________________  EKG ED ECG REPORT I, Randlett N BROWN, the attending physician, personally viewed and interpreted this ECG.   Date: 11/22/2016  EKG Time: 6:41 AM  Rate: 94  Rhythm: Normal sinus rhythm  Axis: Normal  Intervals: Normal  ST&T Change: None    Procedures   ____________________________________________   INITIAL IMPRESSION / ASSESSMENT AND PLAN / ED COURSE  Pertinent labs & imaging results that were available during my care of the patient were reviewed by  me and considered in my medical decision making (see chart for details).  Laboratory data pending patient's care transferred to Dr. Mariea Clonts      ____________________________________________  FINAL CLINICAL IMPRESSION(S) / ED DIAGNOSES  Final diagnoses:  Shortness of breath  Acute nonintractable headache, unspecified headache type  Urinary problem  Atelectasis     MEDICATIONS GIVEN DURING THIS VISIT:  Medications - No data to display   NEW OUTPATIENT MEDICATIONS STARTED DURING THIS VISIT:  New Prescriptions   No medications on file    Modified Medications   No medications on file    Discontinued Medications   No medications on file     Note:  This document was prepared using Dragon voice recognition software and may include unintentional dictation errors.    Gregor Hams, MD 11/22/16 [phone]

## 2017-02-05 ENCOUNTER — Ambulatory Visit (INDEPENDENT_AMBULATORY_CARE_PROVIDER_SITE_OTHER): Payer: Medicare HMO | Admitting: Pulmonary Disease

## 2017-02-05 ENCOUNTER — Other Ambulatory Visit: Payer: Self-pay | Admitting: Pulmonary Disease

## 2017-02-05 ENCOUNTER — Ambulatory Visit
Admission: RE | Admit: 2017-02-05 | Discharge: 2017-02-05 | Disposition: A | Discharge status: Home / Self Care | Payer: Medicare HMO | Source: Ambulatory Visit | Attending: Pulmonary Disease | Admitting: Pulmonary Disease

## 2017-02-05 ENCOUNTER — Encounter: Payer: Self-pay | Admitting: Pulmonary Disease

## 2017-02-05 ENCOUNTER — Ambulatory Visit: Payer: Medicare HMO

## 2017-02-05 VITALS — BP 118/66 | HR 73 | Ht 68.0 in | Wt 159.0 lb

## 2017-02-05 DIAGNOSIS — G8928 Other chronic postprocedural pain: Secondary | ICD-10-CM

## 2017-02-05 DIAGNOSIS — I87303 Chronic venous hypertension (idiopathic) without complications of bilateral lower extremity: Secondary | ICD-10-CM

## 2017-02-05 DIAGNOSIS — J986 Disorders of diaphragm: Secondary | ICD-10-CM

## 2017-02-05 DIAGNOSIS — J9611 Chronic respiratory failure with hypoxia: Secondary | ICD-10-CM

## 2017-02-05 DIAGNOSIS — J9610 Chronic respiratory failure, unspecified whether with hypoxia or hypercapnia: Secondary | ICD-10-CM

## 2017-02-05 DIAGNOSIS — J9612 Chronic respiratory failure with hypercapnia: Secondary | ICD-10-CM

## 2017-02-05 DIAGNOSIS — R600 Localized edema: Secondary | ICD-10-CM | POA: Diagnosis present

## 2017-02-05 LAB — BLOOD GAS, ARTERIAL
Acid-Base Excess: 23.4 mmol/L — ABNORMAL HIGH (ref 0.0–2.0)
BICARBONATE: 52.4 mmol/L — AB (ref 20.0–28.0)
FIO2: 0.32
O2 SAT: 93 %
PATIENT TEMPERATURE: 37
PCO2 ART: 79 mmHg — AB (ref 32.0–48.0)
PO2 ART: 65 mmHg — AB (ref 83.0–108.0)
pH, Arterial: 7.43 (ref 7.350–7.450)

## 2017-02-05 MED ORDER — AMBULATORY NON FORMULARY MEDICATION: 0 refills | Status: DC

## 2017-02-05 NOTE — Addendum Note (Signed)
Addended by: Oscar La R on: 02/05/2017 03:01 PM   Modules accepted: Orders

## 2017-02-05 NOTE — Progress Notes (Addendum)
PULMONARY CONSULT NOTE  Requesting MD/Service: Delight Stare, M.D. Date of initial consultation: 02/05/17 Reason for consultation: Chronic respiratory failure, suspect hypercarbia  PT PROFILE: 71 y.o. male never smoker who underwent extensive cervical spine surgery in September 2017 with progressive decline overall and in particular with his respiratory status since the time of that surgery. A chemistry panel revealed elevated bicarbonate and he is referred for concern of possible hypercarbia  DATA: 11/21/16 CXR: Low lung volumes, bilateral basilar atelectasis 11/21/16 CT chest: No PE. Bilateral basilar atelectasis.  HPI:  As above. He describes shortness of breath with minimal exertion. He also has problems with dysphagia. All of these problems started at the time of cervical spine surgery in September 2017. He also has chronic pain for which she is followed at the pain clinic. He believes his overall condition is declining. He is minimally ambulatory. He has worsening lower extremity edema. He suffered a recent traumatic injury to his left ankle with increased swelling in that extremity. He denies chest pain, productive cough, hemoptysis, calf tenderness. He has undergone a CT scan of his chest in March of this year. This is reviewed above.   Past Medical History:  Diagnosis Date  . Arthritis   . Blood clot associated with vein wall inflammation   . BPH (benign prostatic hypertrophy)   . Cancer ([place])   . Cervical pain (neck)   . GERD (gastroesophageal reflux disease)   . Gout   . Headache   . Heart murmur   . Hemorrhoids   . Hypertension   . Insomnia   . Nocturia associated with benign prostatic hypertrophy   . Numbness and tingling of right arm   . PONV (postoperative nausea and vomiting)    Problems tiliting and turning head  . Shortness of breath dyspnea    with exertion  . Squamous cell skin cancer    right ankle  . Stenosis of cervical spine with myelopathy   . Vertigo      Past Surgical History:  Procedure Laterality Date  . COLONOSCOPY WITH PROPOFOL N/A 02/19/2016   Procedure: COLONOSCOPY WITH PROPOFOL;  Surgeon: Manya Silvas, MD;  Location: [facility] ENDOSCOPY;  Service: Endoscopy;  Laterality: N/A;  . ESOPHAGOGASTRODUODENOSCOPY (EGD) WITH PROPOFOL  02/19/2016   Procedure: ESOPHAGOGASTRODUODENOSCOPY (EGD) WITH PROPOFOL;  Surgeon: Manya Silvas, MD;  Location: [facility] ENDOSCOPY;  Service: Endoscopy;;  . LESION REMOVAL Right 10/26/2015   Procedure: EXCISION RIGHT ANKLE SKIN ;  Surgeon: Wallace Going, DO;  Location: [place];  Service: Plastics;  Laterality: Right;  . POSTERIOR CERVICAL FUSION/FORAMINOTOMY N/A 05/09/2016   Procedure: CERVICAL THREE-FOUR, CERVICAL FOUR-FIVE, CERIVCAL FIVE-SIX LAMINECTOMY WITH LATERAL MASS FUSION;  Surgeon: Consuella Lose, MD;  Location: [place] NEURO ORS;  Service: Neurosurgery;  Laterality: N/A;  LAMINECTOMY, LATERAL MASS FUSION C3-C6  . RADIOLOGY WITH ANESTHESIA N/A 06/11/2016   Procedure: MRI CERVICAL SPINE WITHOUT CONTRAST;  Surgeon: Medication Radiologist, MD;  Location: [place];  Service: Radiology;  Laterality: N/A;  . SKIN GRAFT Right 11/08/2015   Procedure: IRRIGATION AND DEBRIDEMENT AND SPLIT THICKNESS SKIN GRAFT RIGHT ANKLE ;  Surgeon: Wallace Going, DO;  Location: [place];  Service: Plastics;  Laterality: Right;    MEDICATIONS: I have reviewed all medications and confirmed regimen as documented  Social History   Social History  . Marital status: Married    Spouse name: N/A  . Number of children: N/A  . Years of education: N/A   Occupational History  . Not on  file.   Social History Main Topics  . Smoking status: Never Smoker  . Smokeless tobacco: Never Used  . Alcohol use No     Comment: rare beer  . Drug use: No  . Sexual activity: Not on file   Other Topics Concern  . Not on file   Social History Narrative  . No narrative on file    Family History   Problem Relation Age of Onset  . CAD Father     ROS: No fever, myalgias/arthralgias, unexplained weight loss or weight gain No otalgia, hearing loss, visual changes, nasal and sinus symptoms, mouth and throat problems No neck pain or adenopathy No abdominal pain, N/V/D, diarrhea, change in bowel pattern No dysuria, change in urinary pattern   Vitals:   02/05/17 0948 02/05/17 0949  BP:  118/66  Pulse:  73  SpO2:  95%  Weight: 159 lb (72.1 kg)   Height: 5\' 8"  (1.727 m)    02 - 3 LPM pulse by [place]  EXAM:  Gen: Chronically ill-appearing, in wheelchair, on nasal cannula oxygen, no overt distress HEENT: NCAT, sclera white, oropharynx normal Neck: Severe cervical kyphosis, no LAN, thyromegaly, JVP cannot be assessed Thorax/Lungs: Thoracic kyphosis, breath sounds are markedly diminished throughout, diaphragmatic excursion is markedly decreased, percussion note is normal, no adventitious sounds Cardiovascular: Reg, no murmurs noted Abdomen: Soft, nontender, normal BS Ext: No clubbing or cyanosis. Left greater than right ankle and pretibial edema. Area of contusion on the left shin just above the ankle. Interosseous muscle wasting in both hands. Neuro: CNs grossly intact, symmetric weakness in both upper extremities. Skin: Limited exam, no lesions noted  DATA:   BMP Latest Ref Rng & Units 11/21/2016 07/09/2016 06/08/2016  Glucose 65 - 99 mg/dL 99 93 94  BUN 6 - 20 mg/dL 10 15 14   Creatinine 0.61 - 1.24 mg/dL 0.53(L) 0.48(L) 0.58(L)  Sodium 135 - 145 mmol/L 137 133(L) 138  Potassium 3.5 - 5.1 mmol/L 3.5 4.3 3.9  Chloride 101 - 111 mmol/L 96(L) 95(L) 100(L)  CO2 22 - 32 mmol/L 35(H) 33(H) 33(H)  Calcium 8.9 - 10.3 mg/dL 9.7 9.7 9.3    CBC Latest Ref Rng & Units 11/21/2016 07/09/2016 06/08/2016  WBC 3.8 - 10.6 K/uL 7.1 8.1 7.9  Hemoglobin 13.0 - 18.0 g/dL 12.2(L) 12.2(L) 10.3(L)  Hematocrit 40.0 - 52.0 % 36.2(L) 36.9(L) 32.3(L)  Platelets 150 - 440 K/uL 192 209 203    CXR:  As  above ABG: 7.43/79/65  IMPRESSION:   1) Chronic hypercarbic respiratory failure - I suspect that this is 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 Chronic opioid therapy might be contributing as well by causing depressed ventilatory drive. I think this is most likely a less significant contributor 2) chronic hypoxemia - this is due to ventilatory failure as discussed above and bilateral basilar atelectasis resulting from impaired diaphragm function 3) asymmetric lower extremity edema after minor trauma to left ankle  PLAN:  1)  An ABG was checked today after this visit and is reported above 2) Based on chronic hypercarbic neuromuscular respiratory failure, we will initiate noninvasive ventilatory support to be used with sleep 3) Continue oxygen therapy as currently 4) For the lower extremity edema, I have ordered Doppler ultrasound studies - this revealed no evidence of DVT 5) Follow-up in 4-6 weeks. We should begin discussions regarding advanced directives. He would do very poorly if he required intubation or ACLS.  Merton Border, MD PCCM service Mobile [phone] Pager [phone] 02/05/2017 2:40 PM   ADD: due to severe cervical and thoracic spine deformity with severe kyphosis, this pt is unable to perform formal PFTs  Merton Border, MD PCCM service Mobile [phone] Pager [phone] 02/19/2017 8:44 AM

## 2017-02-05 NOTE — Patient Instructions (Addendum)
We will check an ABG to directly measure your carbon dioxide level Based on this, we will likely prescribe nocturnal ventilation with a nasal mask Continue oxygen therapy as currently For the lower extremity edema, I have ordered Doppler ultrasound studies Follow-up in 4-6 weeks

## 2017-02-17 ENCOUNTER — Telehealth: Payer: Self-pay | Admitting: Pulmonary Disease

## 2017-02-17 NOTE — Telephone Encounter (Signed)
Please call re: Vent Referral

## 2017-02-17 NOTE — Telephone Encounter (Signed)
Benjamin Turner with [facility] patient's insurance is requiring either spirometry or PFT. Please advise.

## 2017-02-20 NOTE — Telephone Encounter (Signed)
Have him come in and do office spirometry  Merton Border, MD PCCM service Mobile [phone] Pager [phone] 02/20/2017 2:20 PM

## 2017-02-20 NOTE — Telephone Encounter (Signed)
Per [facility]) Patient has been approved for NIV but can't afford it. He is now applying for charity care through [place].

## 2017-02-25 ENCOUNTER — Other Ambulatory Visit: Payer: Self-pay | Admitting: Nurse Practitioner

## 2017-02-25 DIAGNOSIS — R1310 Dysphagia, unspecified: Secondary | ICD-10-CM

## 2017-02-26 ENCOUNTER — Inpatient Hospital Stay
Admission: EM | Admit: 2017-02-26 | Discharge: 2017-03-26 | DRG: 193 | Disposition: E | Payer: Medicare HMO | Attending: Internal Medicine | Admitting: Internal Medicine

## 2017-02-26 ENCOUNTER — Emergency Department: Payer: Medicare HMO

## 2017-02-26 DIAGNOSIS — Z9119 Patient's noncompliance with other medical treatment and regimen: Secondary | ICD-10-CM

## 2017-02-26 DIAGNOSIS — Z8249 Family history of ischemic heart disease and other diseases of the circulatory system: Secondary | ICD-10-CM

## 2017-02-26 DIAGNOSIS — D61818 Other pancytopenia: Secondary | ICD-10-CM | POA: Diagnosis not present

## 2017-02-26 DIAGNOSIS — E878 Other disorders of electrolyte and fluid balance, not elsewhere classified: Secondary | ICD-10-CM | POA: Diagnosis present

## 2017-02-26 DIAGNOSIS — Z79899 Other long term (current) drug therapy: Secondary | ICD-10-CM

## 2017-02-26 DIAGNOSIS — Z66 Do not resuscitate: Secondary | ICD-10-CM | POA: Diagnosis present

## 2017-02-26 DIAGNOSIS — J986 Disorders of diaphragm: Secondary | ICD-10-CM | POA: Diagnosis present

## 2017-02-26 DIAGNOSIS — I10 Essential (primary) hypertension: Secondary | ICD-10-CM | POA: Diagnosis present

## 2017-02-26 DIAGNOSIS — Z515 Encounter for palliative care: Secondary | ICD-10-CM | POA: Diagnosis present

## 2017-02-26 DIAGNOSIS — J189 Pneumonia, unspecified organism: Principal | ICD-10-CM | POA: Diagnosis present

## 2017-02-26 DIAGNOSIS — E873 Alkalosis: Secondary | ICD-10-CM | POA: Diagnosis present

## 2017-02-26 DIAGNOSIS — K219 Gastro-esophageal reflux disease without esophagitis: Secondary | ICD-10-CM | POA: Diagnosis present

## 2017-02-26 DIAGNOSIS — M1991 Primary osteoarthritis, unspecified site: Secondary | ICD-10-CM | POA: Diagnosis present

## 2017-02-26 DIAGNOSIS — R4182 Altered mental status, unspecified: Secondary | ICD-10-CM | POA: Diagnosis not present

## 2017-02-26 DIAGNOSIS — G4700 Insomnia, unspecified: Secondary | ICD-10-CM | POA: Diagnosis present

## 2017-02-26 DIAGNOSIS — Z981 Arthrodesis status: Secondary | ICD-10-CM

## 2017-02-26 DIAGNOSIS — E871 Hypo-osmolality and hyponatremia: Secondary | ICD-10-CM | POA: Diagnosis present

## 2017-02-26 DIAGNOSIS — M109 Gout, unspecified: Secondary | ICD-10-CM | POA: Diagnosis present

## 2017-02-26 DIAGNOSIS — K5900 Constipation, unspecified: Secondary | ICD-10-CM | POA: Diagnosis present

## 2017-02-26 DIAGNOSIS — Z85828 Personal history of other malignant neoplasm of skin: Secondary | ICD-10-CM

## 2017-02-26 DIAGNOSIS — J90 Pleural effusion, not elsewhere classified: Secondary | ICD-10-CM | POA: Diagnosis present

## 2017-02-26 DIAGNOSIS — R52 Pain, unspecified: Secondary | ICD-10-CM

## 2017-02-26 DIAGNOSIS — Z9981 Dependence on supplemental oxygen: Secondary | ICD-10-CM

## 2017-02-26 DIAGNOSIS — J9622 Acute and chronic respiratory failure with hypercapnia: Secondary | ICD-10-CM | POA: Diagnosis present

## 2017-02-26 DIAGNOSIS — N401 Enlarged prostate with lower urinary tract symptoms: Secondary | ICD-10-CM | POA: Diagnosis present

## 2017-02-26 DIAGNOSIS — G9340 Encephalopathy, unspecified: Secondary | ICD-10-CM | POA: Diagnosis not present

## 2017-02-26 LAB — COMPREHENSIVE METABOLIC PANEL
ALT: 13 U/L — AB (ref 17–63)
AST: 17 U/L (ref 15–41)
Albumin: 3.7 g/dL (ref 3.5–5.0)
Alkaline Phosphatase: 74 U/L (ref 38–126)
Anion gap: 5 (ref 5–15)
BUN: 12 mg/dL (ref 6–20)
CHLORIDE: 76 mmol/L — AB (ref 101–111)
CO2: 49 mmol/L — AB (ref 22–32)
CREATININE: 0.5 mg/dL — AB (ref 0.61–1.24)
Calcium: 10 mg/dL (ref 8.9–10.3)
Glucose, Bld: 97 mg/dL (ref 65–99)
POTASSIUM: 4.7 mmol/L (ref 3.5–5.1)
SODIUM: 130 mmol/L — AB (ref 135–145)
Total Bilirubin: 0.8 mg/dL (ref 0.3–1.2)
Total Protein: 6.6 g/dL (ref 6.5–8.1)

## 2017-02-26 LAB — URINALYSIS, COMPLETE (UACMP) WITH MICROSCOPIC
BACTERIA UA: NONE SEEN
BILIRUBIN URINE: NEGATIVE
Glucose, UA: NEGATIVE mg/dL
Hgb urine dipstick: NEGATIVE
KETONES UR: NEGATIVE mg/dL
LEUKOCYTES UA: NEGATIVE
Nitrite: NEGATIVE
PROTEIN: NEGATIVE mg/dL
SQUAMOUS EPITHELIAL / LPF: NONE SEEN
Specific Gravity, Urine: 1.01 (ref 1.005–1.030)
pH: 8 (ref 5.0–8.0)

## 2017-02-26 LAB — CBC
HEMATOCRIT: 38.7 % — AB (ref 40.0–52.0)
Hemoglobin: 12.8 g/dL — ABNORMAL LOW (ref 13.0–18.0)
MCH: 28.7 pg (ref 26.0–34.0)
MCHC: 33 g/dL (ref 32.0–36.0)
MCV: 87.1 fL (ref 80.0–100.0)
Platelets: 133 10*3/uL — ABNORMAL LOW (ref 150–440)
RBC: 4.45 MIL/uL (ref 4.40–5.90)
RDW: 15.1 % — ABNORMAL HIGH (ref 11.5–14.5)
WBC: 6.4 10*3/uL (ref 3.8–10.6)

## 2017-02-26 LAB — TROPONIN I

## 2017-02-26 MED ORDER — IOPAMIDOL (ISOVUE-300) INJECTION 61%
100.0000 mL | Freq: Once | INTRAVENOUS | Status: AC | PRN
  Administered 2017-02-26: 100 mL via INTRAVENOUS

## 2017-02-26 MED ORDER — DEXTROSE 5 % IV SOLN
1.0000 g | Freq: Once | INTRAVENOUS | Status: AC
  Administered 2017-02-26: 1 g via INTRAVENOUS
  Filled 2017-02-26: qty 10

## 2017-02-26 MED ORDER — SODIUM CHLORIDE 0.9 % IV BOLUS (SEPSIS)
500.0000 mL | Freq: Once | INTRAVENOUS | Status: AC
  Administered 2017-02-26: 500 mL via INTRAVENOUS

## 2017-02-26 MED ORDER — ONDANSETRON HCL 4 MG/2ML IJ SOLN
4.0000 mg | Freq: Once | INTRAMUSCULAR | Status: AC
  Administered 2017-02-26: 4 mg via INTRAVENOUS
  Filled 2017-02-26: qty 2

## 2017-02-26 MED ORDER — DEXTROSE 5 % IV SOLN
500.0000 mg | Freq: Once | INTRAVENOUS | Status: AC
  Administered 2017-02-27: 500 mg via INTRAVENOUS
  Filled 2017-02-26: qty 500

## 2017-02-26 MED ORDER — IOPAMIDOL (ISOVUE-300) INJECTION 61%
30.0000 mL | Freq: Once | INTRAVENOUS | Status: AC | PRN
  Administered 2017-02-26: 30 mL via ORAL

## 2017-02-26 NOTE — ED Notes (Signed)
Patient transported to CT 

## 2017-02-26 NOTE — ED Provider Notes (Signed)
[facility] Emergency Department Provider Note   ____________________________________________   First MD Initiated Contact with Patient 02/27/2017 2009     (approximate)  I have reviewed the triage vital signs and the nursing notes.   HISTORY  Chief Complaint Constipation and Altered Mental Status   HPI Benjamin Turner. is a 71 y.o. male here for evaluation of abdominal pain  Patient reports that for 4 days and difficulty cardiovascular and feels severely backed up and constipated. He stridor use the toilet multiple times today, but reports he cannot pass any stool. He reports his abdomen is sore and painful, and feels nauseated.  EMS reported the patient may have confusion, nursing staff had initially documented this however he is currently alert and fully oriented to date place and time. Recognizes 03/04/2017 today.  Denies any chest pain or trouble breathing. Reports he gets constipated due to pain medications he takes and is on laxatives and used a suppository earlier today with no success   Past Medical History:  Diagnosis Date  . Arthritis   . Blood clot associated with vein wall inflammation   . BPH (benign prostatic hypertrophy)   . Cancer ([place])   . Cervical pain (neck)   . GERD (gastroesophageal reflux disease)   . Gout   . Headache   . Heart murmur   . Hemorrhoids   . Hypertension   . Insomnia   . Nocturia associated with benign prostatic hypertrophy   . Numbness and tingling of right arm   . PONV (postoperative nausea and vomiting)    Problems tiliting and turning head  . Shortness of breath dyspnea    with exertion  . Squamous cell skin cancer    right ankle  . Stenosis of cervical spine with myelopathy   . Vertigo     Patient Active Problem List   Diagnosis Date Noted  . Closed fracture of shaft of right fibula   . Fall   . Lower extremity weakness 06/07/2016  . BPH (benign prostatic hyperplasia) 06/07/2016  .  Constipation 06/07/2016  . Post-op pain 06/07/2016  . GERD (gastroesophageal reflux disease) 06/07/2016  . Sepsis ([place]) 05/20/2016  . Cervical spondylosis with myelopathy 05/09/2016  . Malnutrition of moderate degree 01/16/2016  . Weakness 01/14/2016  . Headache 01/14/2016  . Spinal cord compression ([place]) 09/14/2015  . Paresthesia of arm 09/14/2015  . Hyponatremia 09/14/2015  . Essential hypertension 09/14/2015  . Skin cancer 09/14/2015  . Debility 09/14/2015  . Right arm weakness 09/14/2015  . Nonspecific abnormal electrocardiogram (ECG) (EKG) 09/14/2015  . Bradycardia 09/14/2015    Past Surgical History:  Procedure Laterality Date  . COLONOSCOPY WITH PROPOFOL N/A 02/19/2016   Procedure: COLONOSCOPY WITH PROPOFOL;  Surgeon: Manya Silvas, MD;  Location: [facility] ENDOSCOPY;  Service: Endoscopy;  Laterality: N/A;  . ESOPHAGOGASTRODUODENOSCOPY (EGD) WITH PROPOFOL  02/19/2016   Procedure: ESOPHAGOGASTRODUODENOSCOPY (EGD) WITH PROPOFOL;  Surgeon: Manya Silvas, MD;  Location: [facility] ENDOSCOPY;  Service: Endoscopy;;  . LESION REMOVAL Right 10/26/2015   Procedure: EXCISION RIGHT ANKLE SKIN ;  Surgeon: Wallace Going, DO;  Location: [place];  Service: Plastics;  Laterality: Right;  . POSTERIOR CERVICAL FUSION/FORAMINOTOMY N/A 05/09/2016   Procedure: CERVICAL THREE-FOUR, CERVICAL FOUR-FIVE, CERIVCAL FIVE-SIX LAMINECTOMY WITH LATERAL MASS FUSION;  Surgeon: Consuella Lose, MD;  Location: [place] NEURO ORS;  Service: Neurosurgery;  Laterality: N/A;  LAMINECTOMY, LATERAL MASS FUSION C3-C6  . RADIOLOGY WITH ANESTHESIA N/A 06/11/2016   Procedure: MRI CERVICAL SPINE WITHOUT  CONTRAST;  Surgeon: Medication Radiologist, MD;  Location: [place];  Service: Radiology;  Laterality: N/A;  . SKIN GRAFT Right 11/08/2015   Procedure: IRRIGATION AND DEBRIDEMENT AND SPLIT THICKNESS SKIN GRAFT RIGHT ANKLE ;  Surgeon: Wallace Going, DO;  Location: [place];  Service: Plastics;   Laterality: Right;    Prior to Admission medications   Medication Sig Start Date End Date Taking? Authorizing Provider  AMBULATORY NON FORMULARY MEDICATION Medication Name: Arterial Blood Gas STAT DX Code: J96.11 02/05/17   Wilhelmina Mcardle, MD  docusate sodium (COLACE) 100 MG capsule Take 100 mg by mouth 2 (two) times daily.    [provider]  gabapentin (NEURONTIN) 300 MG capsule Take 300 mg by mouth daily.    [provider]  guaiFENesin (MUCINEX) 600 MG 12 hr tablet Take 600 mg by mouth at bedtime.    [provider]  metoprolol tartrate (LOPRESSOR) 25 MG tablet Take 25 mg by mouth 2 (two) times daily.    [provider]  naloxegol oxalate (MOVANTIK) 25 MG TABS tablet Take 25 mg by mouth daily.    [provider]  omeprazole (PRILOSEC) 40 MG capsule Take 40 mg by mouth daily.    [provider]  ondansetron (ZOFRAN ODT) 4 MG disintegrating tablet Take 1 tablet (4 mg total) by mouth every 8 (eight) hours as needed for nausea or vomiting. 12/03/15   Eula Listen, MD  tamsulosin (FLOMAX) 0.4 MG CAPS capsule Take 1 capsule (0.4 mg total) by mouth daily. 01/16/16   Henreitta Leber, MD  XTAMPZA ER 9 MG C12A Take 9 mg by mouth daily. 01/14/17   [provider]    Allergies Atenolol; Codeine; and Diltiazem hcl  Family History  Problem Relation Age of Onset  . CAD Father     Social History Social History  Substance Use Topics  . Smoking status: Never Smoker  . Smokeless tobacco: Never Used  . Alcohol use No     Comment: rare beer    Review of Systems Constitutional: No fever/chills. Eyes: No visual changes. ENT: No sore throat. Cardiovascular: Denies chest pain. Respiratory: Denies shortness of breath. Gastrointestinal: No vomiting.  No diarrhea.  No constipation. Genitourinary: Negative for dysuria. Musculoskeletal: Negative for back pain. Skin: Negative for rash. Neurological: Negative for headaches, focal  weakness or numbness.    ____________________________________________   PHYSICAL EXAM:  VITAL SIGNS: ED Triage Vitals  Enc Vitals Group     BP 03/06/2017 2009 138/78     Pulse Rate 03/06/2017 2009 78     Resp 03/19/2017 2009 17     Temp 03/25/2017 2009 (!) 97.4 F (36.3 C)     Temp Source 03/10/2017 2009 Oral     SpO2 03/05/2017 2009 100 %     Weight 03/22/2017 2005 170 lb (77.1 kg)     Height 03/01/2017 2005 5\' 8"  (1.727 m)     Head Circumference --      Peak Flow --      Pain Score 03/08/2017 2005 6     Pain Loc --      Pain Edu? --      Excl. in [place]? --     Constitutional: Alert and oriented. Well appearing and in no acute distress. Eyes: Conjunctivae are normal. Head: Atraumatic. Nose: No congestion/rhinnorhea. Mouth/Throat: Mucous membranes are moist. Neck: No stridor.   Cardiovascular: Normal rate, regular rhythm. Grossly normal heart sounds.  Good peripheral circulation. Respiratory: Normal respiratory effort.  No  retractions. Lungs CTAB. Gastrointestinal: Soft and Moderately tender throughout, question some mild distention along the left. Rectal exam demonstrates a very small amoutn of stool on with empty vault with brown nonbloody stool. Musculoskeletal: No lower extremity tenderness nor edema. Neurologic:  Normal speech and language. No gross focal neurologic deficits are appreciated.  Skin:  Skin is warm, dry and intact. No rash noted. Psychiatric: Mood and affect are normal. Speech and behavior are normal.  ____________________________________________   LABS (all labs ordered are listed, but only abnormal results are displayed)  Labs Reviewed  COMPREHENSIVE METABOLIC PANEL - Abnormal; Notable for the following:       Result Value   Sodium 130 (*)    Chloride 76 (*)    CO2 49 (*)    Creatinine, Ser 0.50 (*)    ALT 13 (*)    All other components within normal limits  CBC - Abnormal; Notable for the following:    Hemoglobin 12.8 (*)    HCT 38.7 (*)    RDW 15.1 (*)      Platelets 133 (*)    All other components within normal limits  URINALYSIS, COMPLETE (UACMP) WITH MICROSCOPIC - Abnormal; Notable for the following:    Color, Urine YELLOW (*)    APPearance HAZY (*)    All other components within normal limits  CULTURE, BLOOD (ROUTINE X 2)  CULTURE, BLOOD (ROUTINE X 2)  TROPONIN I   ____________________________________________  EKG  Reviewed injury by me at 2115 Heart rate 100 and curious 99 QTc 4:30 Frontal sinus tachycardia, no evidence of acute ischemia noted as compared to his previous EKG from 07/09/2016 ____________________________________________  RADIOLOGY  Dg Chest 2 View  Result Date: 03/05/2017 CLINICAL DATA:  Abdominal pain and constipation. EXAM: CHEST  2 VIEW COMPARISON:  11/21/2016 CT and CXR FINDINGS: Low lung volumes with borderline cardiomegaly and aortic atherosclerosis. Bibasilar atelectasis. No overt pulmonary edema, effusion or pneumonic consolidations. Patchy bony demineralization of the bony thorax and shoulders. This is a nonspecific finding. IMPRESSION: Low lung volumes with atelectasis. No pneumonic consolidation or overt pulmonary edema. Patchy bony demineralization of the bony thorax and shoulders. Cannot exclude the possibility of myeloma. Electronically Signed   By: Ashley Royalty M.D.   On: 02/28/2017 23:33   Ct Abdomen Pelvis W Contrast  Result Date: 03/05/2017 CLINICAL DATA:  Acute onset of constipation and generalized abdominal pain. Initial encounter. EXAM: CT ABDOMEN AND PELVIS WITH CONTRAST TECHNIQUE: Multidetector CT imaging of the abdomen and pelvis was performed using the standard protocol following bolus administration of intravenous contrast. CONTRAST:  179mL ISOVUE-300 IOPAMIDOL (ISOVUE-300) INJECTION 61% COMPARISON:  CT of the abdomen and pelvis performed 05/19/2016 FINDINGS: Lower chest: A mildly loculated small right pleural effusion is noted, with apparent peripheral enhancement. Underlying right-sided empyema  cannot be excluded. Mild right basilar atelectasis is seen. Diffuse coronary artery calcifications are seen. Hepatobiliary: Small calcified granulomata are seen within the liver. The liver is otherwise unremarkable. The gallbladder is unremarkable in appearance. The common bile duct remains normal in caliber. Pancreas: The pancreas is within normal limits. Spleen: The spleen is unremarkable in appearance. Adrenals/Urinary Tract: The adrenal glands are unremarkable in appearance. Bilateral renal cysts are noted. A 6 mm stone is noted at the lower pole of the left kidney. There is no evidence of hydronephrosis. No ureteral stones are identified. No perinephric stranding is seen. Stomach/Bowel: The stomach is unremarkable in appearance. The small bowel is within normal limits. The appendix is normal in caliber, without evidence  of appendicitis. Scattered diverticulosis is noted along the sigmoid colon, without evidence of diverticulitis. Vascular/Lymphatic: Scattered calcification is seen along the abdominal aorta and its branches. The abdominal aorta is otherwise grossly unremarkable. The inferior vena cava is grossly unremarkable. No retroperitoneal lymphadenopathy is seen. No pelvic sidewall lymphadenopathy is identified. Reproductive: The bladder is moderately distended and contains small stones. The prostate is normal in size, with scattered calcification. A small left inguinal hernia is noted, containing only fat. Other: No additional soft tissue abnormalities are seen. Musculoskeletal: No acute osseous abnormalities are identified. The visualized musculature is unremarkable in appearance. IMPRESSION: 1. Mildly loculated small right pleural effusion, with apparent peripheral enhancement. Underlying right-sided empyema cannot be excluded. Would correlate for any associated symptoms. Mild right basilar atelectasis seen. 2. Scattered aortic atherosclerosis. 3. Scattered diverticulosis along the sigmoid colon,  without evidence of diverticulitis. 4. 6 mm stone at the lower pole of the left kidney. Bilateral renal cysts noted. 5. Small stones noted within the bladder. 6. Small left inguinal hernia, containing only fat. 7. Diffuse coronary artery calcifications seen. Electronically Signed   By: Garald Balding M.D.   On: 03/13/2017 22:15    ____________________________________________   PROCEDURES  Procedure(s) performed: None  Procedures  Critical Care performed: No  ____________________________________________   INITIAL IMPRESSION / ASSESSMENT AND PLAN / ED COURSE  Pertinent labs & imaging results that were available during my care of the patient were reviewed by me and considered in my medical decision making (see chart for details).  Differential diagnosis includes but is not limited to, abdominal perforation, aortic dissection, cholecystitis, appendicitis, diverticulitis, colitis, esophagitis/gastritis, kidney stone, pyelonephritis, urinary tract infection, aortic aneurysm. All are considered in decision and treatment plan. Based upon the patient's presentation and risk factors, I suspect he likely severely constipated. Rectal exam did not demonstrate a fecal impaction. Given his associated pain, we'll perform CT to further evaluate and exclude acute (and affirm constipation) process before proceeding with any type of enema.  Patient is not disoriented at present, and the patient's wife who is with him reports that he's been in his normal health and she has not noticed any confusion at home either.     ----------------------------------------- 11:57 PM on 03/01/2017 -----------------------------------------  Patient resting comfortably. CT reviewed, does not demonstrate any evidence of obvious constipation or acute abdomen. However concerning as he has a loculated pleural effusion, in further discussion notes his wife is present to discuss review of systems he has been feeling short of  breath recently. I am concerned the patient may have pneumonia and/or associated empyema and/or other cause for loculated pleural effusion such as malignancy. We will admit the patient for further workup.   ____________________________________________   FINAL CLINICAL IMPRESSION(S) / ED DIAGNOSES  Final diagnoses:  Loculated pleural effusion      NEW MEDICATIONS STARTED DURING THIS VISIT:  New Prescriptions   No medications on file     Note:  This document was prepared using Dragon voice recognition software and may include unintentional dictation errors.     Delman Kitten, MD 02/27/17 [phone]

## 2017-02-26 NOTE — ED Notes (Signed)
assisted pt to void in urinal

## 2017-02-26 NOTE — ED Triage Notes (Signed)
Pt arrived via ems for c/o abd and constipation for the last 4 days - pt takes narcotic pain medication that makes him having difficulty moving bowels - pt states he has had no BM in 4 days - pt is confused and disoriented to place and time - pt is poor historian - wife is on way to hospital

## 2017-02-27 ENCOUNTER — Inpatient Hospital Stay: Payer: Medicare HMO

## 2017-02-27 ENCOUNTER — Encounter: Payer: Self-pay | Admitting: Internal Medicine

## 2017-02-27 ENCOUNTER — Telehealth: Payer: Self-pay | Admitting: Pulmonary Disease

## 2017-02-27 DIAGNOSIS — Z8249 Family history of ischemic heart disease and other diseases of the circulatory system: Secondary | ICD-10-CM | POA: Diagnosis not present

## 2017-02-27 DIAGNOSIS — E873 Alkalosis: Secondary | ICD-10-CM | POA: Diagnosis present

## 2017-02-27 DIAGNOSIS — E871 Hypo-osmolality and hyponatremia: Secondary | ICD-10-CM | POA: Diagnosis present

## 2017-02-27 DIAGNOSIS — J9622 Acute and chronic respiratory failure with hypercapnia: Secondary | ICD-10-CM | POA: Diagnosis present

## 2017-02-27 DIAGNOSIS — G4700 Insomnia, unspecified: Secondary | ICD-10-CM | POA: Diagnosis present

## 2017-02-27 DIAGNOSIS — K5900 Constipation, unspecified: Secondary | ICD-10-CM | POA: Diagnosis present

## 2017-02-27 DIAGNOSIS — M109 Gout, unspecified: Secondary | ICD-10-CM | POA: Diagnosis present

## 2017-02-27 DIAGNOSIS — N401 Enlarged prostate with lower urinary tract symptoms: Secondary | ICD-10-CM | POA: Diagnosis present

## 2017-02-27 DIAGNOSIS — G9340 Encephalopathy, unspecified: Secondary | ICD-10-CM | POA: Diagnosis not present

## 2017-02-27 DIAGNOSIS — J986 Disorders of diaphragm: Secondary | ICD-10-CM | POA: Diagnosis present

## 2017-02-27 DIAGNOSIS — Z79899 Other long term (current) drug therapy: Secondary | ICD-10-CM | POA: Diagnosis not present

## 2017-02-27 DIAGNOSIS — R4182 Altered mental status, unspecified: Secondary | ICD-10-CM | POA: Diagnosis present

## 2017-02-27 DIAGNOSIS — E878 Other disorders of electrolyte and fluid balance, not elsewhere classified: Secondary | ICD-10-CM | POA: Diagnosis present

## 2017-02-27 DIAGNOSIS — J189 Pneumonia, unspecified organism: Secondary | ICD-10-CM | POA: Diagnosis present

## 2017-02-27 DIAGNOSIS — Z515 Encounter for palliative care: Secondary | ICD-10-CM | POA: Diagnosis present

## 2017-02-27 DIAGNOSIS — Z981 Arthrodesis status: Secondary | ICD-10-CM | POA: Diagnosis not present

## 2017-02-27 DIAGNOSIS — M1991 Primary osteoarthritis, unspecified site: Secondary | ICD-10-CM | POA: Diagnosis present

## 2017-02-27 DIAGNOSIS — J90 Pleural effusion, not elsewhere classified: Secondary | ICD-10-CM | POA: Diagnosis present

## 2017-02-27 DIAGNOSIS — D61818 Other pancytopenia: Secondary | ICD-10-CM | POA: Diagnosis not present

## 2017-02-27 DIAGNOSIS — I10 Essential (primary) hypertension: Secondary | ICD-10-CM | POA: Diagnosis present

## 2017-02-27 DIAGNOSIS — K219 Gastro-esophageal reflux disease without esophagitis: Secondary | ICD-10-CM | POA: Diagnosis present

## 2017-02-27 DIAGNOSIS — Z66 Do not resuscitate: Secondary | ICD-10-CM | POA: Diagnosis present

## 2017-02-27 DIAGNOSIS — Z85828 Personal history of other malignant neoplasm of skin: Secondary | ICD-10-CM | POA: Diagnosis not present

## 2017-02-27 LAB — BASIC METABOLIC PANEL
Anion gap: 7 (ref 5–15)
BUN: 7 mg/dL (ref 6–20)
CHLORIDE: 76 mmol/L — AB (ref 101–111)
CO2: 43 mmol/L — ABNORMAL HIGH (ref 22–32)
CREATININE: 0.44 mg/dL — AB (ref 0.61–1.24)
Calcium: 9.2 mg/dL (ref 8.9–10.3)
GFR calc Af Amer: >60 mL/min (ref >60)
GFR calc non Af Amer: >60 mL/min (ref >60)
GLUCOSE: 108 mg/dL — AB (ref 65–99)
POTASSIUM: 3.9 mmol/L (ref 3.5–5.1)
SODIUM: 126 mmol/L — AB (ref 135–145)

## 2017-02-27 LAB — CBC
HCT: 36.3 % — ABNORMAL LOW (ref 40.0–52.0)
HEMOGLOBIN: 12 g/dL — AB (ref 13.0–18.0)
MCH: 28.8 pg (ref 26.0–34.0)
MCHC: 32.9 g/dL (ref 32.0–36.0)
MCV: 87.4 fL (ref 80.0–100.0)
Platelets: 122 10*3/uL — ABNORMAL LOW (ref 150–440)
RBC: 4.15 MIL/uL — ABNORMAL LOW (ref 4.40–5.90)
RDW: 14.6 % — ABNORMAL HIGH (ref 11.5–14.5)
WBC: 6.7 10*3/uL (ref 3.8–10.6)

## 2017-02-27 LAB — MRSA PCR SCREENING: MRSA by PCR: NEGATIVE

## 2017-02-27 MED ORDER — PANTOPRAZOLE SODIUM 40 MG PO TBEC
40.0000 mg | DELAYED_RELEASE_TABLET | Freq: Every day | ORAL | Status: DC
  Administered 2017-02-27 – 2017-03-02 (×3): 40 mg via ORAL
  Filled 2017-02-27 (×3): qty 1

## 2017-02-27 MED ORDER — DEXTROSE 5 % IV SOLN
500.0000 mg | INTRAVENOUS | Status: DC
  Administered 2017-02-27 – 2017-03-01 (×3): 500 mg via INTRAVENOUS
  Filled 2017-02-27 (×5): qty 500

## 2017-02-27 MED ORDER — ACETAMINOPHEN 325 MG PO TABS
650.0000 mg | ORAL_TABLET | Freq: Four times a day (QID) | ORAL | Status: DC | PRN
  Administered 2017-03-02: 650 mg via ORAL
  Filled 2017-02-27: qty 2

## 2017-02-27 MED ORDER — DOCUSATE SODIUM 100 MG PO CAPS
100.0000 mg | ORAL_CAPSULE | Freq: Two times a day (BID) | ORAL | Status: DC
  Administered 2017-02-27 – 2017-03-02 (×5): 100 mg via ORAL
  Filled 2017-02-27 (×6): qty 1

## 2017-02-27 MED ORDER — IPRATROPIUM-ALBUTEROL 0.5-2.5 (3) MG/3ML IN SOLN
3.0000 mL | Freq: Four times a day (QID) | RESPIRATORY_TRACT | Status: DC | PRN
  Administered 2017-02-27: 3 mL via RESPIRATORY_TRACT
  Filled 2017-02-27: qty 3

## 2017-02-27 MED ORDER — GUAIFENESIN ER 600 MG PO TB12
600.0000 mg | ORAL_TABLET | Freq: Every day | ORAL | Status: DC
  Administered 2017-02-27 – 2017-03-01 (×2): 600 mg via ORAL
  Filled 2017-02-27 (×3): qty 1

## 2017-02-27 MED ORDER — DEXTROSE 5 % IV SOLN
1.0000 g | INTRAVENOUS | Status: DC
  Administered 2017-02-27 – 2017-03-01 (×3): 1 g via INTRAVENOUS
  Filled 2017-02-27 (×5): qty 10

## 2017-02-27 MED ORDER — PSEUDOEPHEDRINE HCL 30 MG PO TABS
60.0000 mg | ORAL_TABLET | Freq: Every day | ORAL | Status: DC
  Administered 2017-02-27 – 2017-03-01 (×2): 60 mg via ORAL
  Filled 2017-02-27 (×2): qty 2
  Filled 2017-02-27: qty 1
  Filled 2017-02-27 (×2): qty 2

## 2017-02-27 MED ORDER — ONDANSETRON HCL 4 MG/2ML IJ SOLN: 4.0000 mg | Freq: Four times a day (QID) | INTRAMUSCULAR | Status: DC | PRN

## 2017-02-27 MED ORDER — ACETAMINOPHEN 650 MG RE SUPP: 650.0000 mg | Freq: Four times a day (QID) | RECTAL | Status: DC | PRN

## 2017-02-27 MED ORDER — ONDANSETRON HCL 4 MG PO TABS
4.0000 mg | ORAL_TABLET | Freq: Four times a day (QID) | ORAL | Status: DC | PRN
  Administered 2017-02-27: 4 mg via ORAL
  Filled 2017-02-27: qty 1

## 2017-02-27 MED ORDER — SENNOSIDES-DOCUSATE SODIUM 8.6-50 MG PO TABS: 1.0000 | ORAL_TABLET | Freq: Every evening | ORAL | Status: DC | PRN

## 2017-02-27 MED ORDER — GABAPENTIN 300 MG PO CAPS
300.0000 mg | ORAL_CAPSULE | Freq: Every evening | ORAL | Status: DC
  Administered 2017-02-27 – 2017-03-01 (×2): 300 mg via ORAL
  Filled 2017-02-27 (×2): qty 1

## 2017-02-27 MED ORDER — SODIUM CHLORIDE 0.9% FLUSH
3.0000 mL | Freq: Two times a day (BID) | INTRAVENOUS | Status: DC
  Administered 2017-02-27: 9 mL via INTRAVENOUS
  Administered 2017-02-28: 6 mL via INTRAVENOUS
  Administered 2017-02-28 – 2017-03-02 (×4): 3 mL via INTRAVENOUS

## 2017-02-27 MED ORDER — DEXTROSE 5 % IV SOLN
500.0000 mg | INTRAVENOUS | Status: DC
  Filled 2017-02-27: qty 500

## 2017-02-27 MED ORDER — SODIUM CHLORIDE 0.9 % IV SOLN: 250.0000 mL | INTRAVENOUS | Status: DC | PRN

## 2017-02-27 MED ORDER — OXYCODONE HCL ER 10 MG PO T12A
10.0000 mg | EXTENDED_RELEASE_TABLET | Freq: Two times a day (BID) | ORAL | Status: DC
  Administered 2017-02-27 (×2): 10 mg via ORAL
  Filled 2017-02-27 (×3): qty 1

## 2017-02-27 MED ORDER — PSEUDOEPHEDRINE-GUAIFENESIN ER 60-600 MG PO TB12: 1.0000 | ORAL_TABLET | Freq: Every day | ORAL | Status: DC

## 2017-02-27 MED ORDER — TAMSULOSIN HCL 0.4 MG PO CAPS
0.4000 mg | ORAL_CAPSULE | Freq: Every day | ORAL | Status: DC
  Administered 2017-02-27 – 2017-03-02 (×3): 0.4 mg via ORAL
  Filled 2017-02-27 (×3): qty 1

## 2017-02-27 MED ORDER — METOPROLOL TARTRATE 25 MG PO TABS
25.0000 mg | ORAL_TABLET | Freq: Every day | ORAL | Status: DC
  Administered 2017-02-27: 25 mg via ORAL
  Filled 2017-02-27: qty 1

## 2017-02-27 MED ORDER — ENOXAPARIN SODIUM 40 MG/0.4ML ~~LOC~~ SOLN
40.0000 mg | SUBCUTANEOUS | Status: DC
  Administered 2017-02-27 – 2017-03-02 (×4): 40 mg via SUBCUTANEOUS
  Filled 2017-02-27 (×4): qty 0.4

## 2017-02-27 MED ORDER — SODIUM CHLORIDE 0.9% FLUSH: 3.0000 mL | INTRAVENOUS | Status: DC | PRN

## 2017-02-27 MED ORDER — LINACLOTIDE 290 MCG PO CAPS
290.0000 ug | ORAL_CAPSULE | Freq: Every day | ORAL | Status: DC
  Administered 2017-02-27 – 2017-03-02 (×2): 290 ug via ORAL
  Filled 2017-02-27 (×4): qty 1

## 2017-02-27 NOTE — Progress Notes (Signed)
Patient educated on bipap but is currently refusing to be placed on it if needed. Per Dr Edwina Barth, who talked with Dr Shawna Orleans, patient will be placed on bipap only at night, and that the CO2 is close to his baseline. Does not require to be transferred at this moment.  Patient is not in any distress. O2 is at 2L, will continue to monitor.

## 2017-02-27 NOTE — Telephone Encounter (Signed)
FYI from pt stating he has refused triology machine due to making him feel claustrophobic.

## 2017-02-27 NOTE — Progress Notes (Signed)
Nurse asked Ch to visit with pt in ICU02. CH met with pt, pt's wife and sister who were at the bedside. CH talked pt's wife, Linda, and wife's sister, Mary, who stated that they were anxious and very worried about pt's health. CH encouraged them and prayed for pt and his family. CH will follow up pt as needed.    02/27/17 2300  Clinical Encounter Type  Visited With Patient;Patient and family together  Visit Type Initial;Follow-up  Referral From Nurse  Consult/Referral To Chaplain  Spiritual Encounters  Spiritual Needs Prayer;Emotional;Other (Comment)   

## 2017-02-27 NOTE — Care Management Note (Signed)
Case Management Note  Patient Details  Name: Benjamin HINDERMAN Sr. MRN: [id number] Date of Birth: 04-07-1946  Subjective/Objective:  Patient open to [facility] for PT and OT. Will need nursing added at discharge due to PNA.                   Action/Plan: Will need home health nursing, PT and OT.   Expected Discharge Date:  March 21, 2017               Expected Discharge Plan:  [place]  In-House Referral:     Discharge planning Services  CM Consult  Post Acute Care Choice:  Home Health, Resumption of Svcs/PTA Provider Choice offered to:     DME Arranged:    DME Agency:     HH Arranged:  PT, OT, RN [place] Agency:  [place]  Status of Service:  In process, will continue to follow  If discussed at Long Length of Stay Meetings, dates discussed:    Additional Comments:  Jolly Mango, RN 02/27/2017, 2:46 PM

## 2017-02-27 NOTE — Progress Notes (Signed)
Pharmacy Antibiotic Note  Benjamin GILLIE Sr. is a 71 y.o. male admitted on 03/17/2017 with pneumonia.  Pharmacy has been consulted for azithro/ceftriaxone dosing.  Plan: Will start patient on azithromycin 500 mg IV daily with ceftriaxone 1g IV daily  Height: 5\' 8"  (172.7 cm) Weight: 170 lb (77.1 kg) IBW/kg (Calculated) : 68.4  Temp (24hrs), Avg:97.4 F (36.3 C), Min:97.4 F (36.3 C), Max:97.4 F (36.3 C)   Recent Labs Lab 03/07/2017 2055  WBC 6.4  CREATININE 0.50*    Estimated Creatinine Clearance: 81.9 mL/min (A) (by C-G formula based on SCr of 0.5 mg/dL (L)).    Allergies  Allergen Reactions  . Atenolol Other (See Comments)    Reaction: unknown  Family states it either "made him sick to his stomach or very dizzy"  . Codeine Nausea And Vomiting  . Diltiazem Hcl Other (See Comments)    Reaction: unknown  Family states it either "made him sick to his stomach or very dizzy"    Thank you for allowing pharmacy to be a part of this patient's care.  Tobie Lords, PharmD, BCPS Clinical Pharmacist 02/27/2017

## 2017-02-27 NOTE — Progress Notes (Signed)
Pt alert. On 4L of oxygen, on a chronic 3L at home. No complaints of shortness of breath.

## 2017-02-27 NOTE — Telephone Encounter (Signed)
Nurse states pt has refused Trilogy vent, states it is making him feel claustrophobic.

## 2017-02-27 NOTE — Progress Notes (Signed)
Patient ID: Benjamin GRASER Sr., male   DOB: Oct 27, 1945, 71 y.o.   MRN: [id number]  Interventional Radiology:  Ultrasound shows only very small right pleural effusion.  Not large enough to safely tap.  Does not appear complex or loculated by ultrasound.  Venetia Night. Kathlene Cote, M.D Pager:  [phone]

## 2017-02-27 NOTE — Progress Notes (Signed)
Case discussed with  elink. regarding patient's transfer to the ICU

## 2017-02-27 NOTE — Progress Notes (Signed)
Subjective: Patient admitted last night due to lethargy and constipation. He was found to have a right loculated pleural effusion on CT scan of his abdomen. His son is at the bedside today. He appears to be back to his baseline. He has chronic respiratory failure although the patient and his son couldn't tell me why. They related back to a cervical spine surgery they had done a couple months ago at [facility]. Will review those records. They said during the past couple weeks she's had a cough is been fairly persistent and productive. He is supposed to be on either CPAP or BiPAP at night for his chronic restaurant failure but according to his son he is not very compliant with it.  Objective: Vital signs in last 24 hours: Temp:  [97.4 F (36.3 C)-97.6 F (36.4 C)] 97.6 F (36.4 C) (07/05 0402) Pulse Rate:  [78-111] 109 (07/05 0732) Resp:  [17-38] 18 (07/05 0732) BP: (128-158)/(71-95) 158/94 (07/05 0732) SpO2:  [96 %-100 %] 96 % (07/05 0732) Weight:  [72.1 kg (159 lb)-77.1 kg (170 lb)] 72.1 kg (159 lb) (07/05 0403) Weight change:  Last BM Date: 02/23/17  Intake/Output from previous day: 07/04 0701 - 07/05 0700 In: 500 [IV Piggyback:500] Out: 300 [Urine:300] Intake/Output this shift: Total I/O In: 240 [P.O.:240] Out: 125 [Urine:125]  General appearance: no distress Resp: Decreased breath sounds right base. Very mild scattered rhonchi nasal accessory muscles Cardio: regular rate and rhythm, S1, S2 normal, no murmur, click, rub or gallop GI: soft, non-tender; bowel sounds normal; no masses,  no organomegaly  Lab Results:  Recent Labs  03/16/2017 2055 02/27/17 0712  WBC 6.4 6.7  HGB 12.8* 12.0*  HCT 38.7* 36.3*  PLT 133* 122*   BMET  Recent Labs  03/04/2017 2055 02/27/17 0712  NA 130* 126*  K 4.7 3.9  CL 76* 76*  CO2 49* 43*  GLUCOSE 97 108*  BUN 12 7  CREATININE 0.50* 0.44*  CALCIUM 10.0 9.2    Studies/Results: Dg Chest 2 View  Result Date: 03/23/2017 CLINICAL DATA:   Abdominal pain and constipation. EXAM: CHEST  2 VIEW COMPARISON:  11/21/2016 CT and CXR FINDINGS: Low lung volumes with borderline cardiomegaly and aortic atherosclerosis. Bibasilar atelectasis. No overt pulmonary edema, effusion or pneumonic consolidations. Patchy bony demineralization of the bony thorax and shoulders. This is a nonspecific finding. IMPRESSION: Low lung volumes with atelectasis. No pneumonic consolidation or overt pulmonary edema. Patchy bony demineralization of the bony thorax and shoulders. Cannot exclude the possibility of myeloma. Electronically Signed   By: Ashley Royalty M.D.   On: 03/12/2017 23:33   Ct Abdomen Pelvis W Contrast  Result Date: 03/13/2017 CLINICAL DATA:  Acute onset of constipation and generalized abdominal pain. Initial encounter. EXAM: CT ABDOMEN AND PELVIS WITH CONTRAST TECHNIQUE: Multidetector CT imaging of the abdomen and pelvis was performed using the standard protocol following bolus administration of intravenous contrast. CONTRAST:  169mL ISOVUE-300 IOPAMIDOL (ISOVUE-300) INJECTION 61% COMPARISON:  CT of the abdomen and pelvis performed 05/19/2016 FINDINGS: Lower chest: A mildly loculated small right pleural effusion is noted, with apparent peripheral enhancement. Underlying right-sided empyema cannot be excluded. Mild right basilar atelectasis is seen. Diffuse coronary artery calcifications are seen. Hepatobiliary: Small calcified granulomata are seen within the liver. The liver is otherwise unremarkable. The gallbladder is unremarkable in appearance. The common bile duct remains normal in caliber. Pancreas: The pancreas is within normal limits. Spleen: The spleen is unremarkable in appearance. Adrenals/Urinary Tract: The adrenal glands are unremarkable in appearance. Bilateral  renal cysts are noted. A 6 mm stone is noted at the lower pole of the left kidney. There is no evidence of hydronephrosis. No ureteral stones are identified. No perinephric stranding is seen.  Stomach/Bowel: The stomach is unremarkable in appearance. The small bowel is within normal limits. The appendix is normal in caliber, without evidence of appendicitis. Scattered diverticulosis is noted along the sigmoid colon, without evidence of diverticulitis. Vascular/Lymphatic: Scattered calcification is seen along the abdominal aorta and its branches. The abdominal aorta is otherwise grossly unremarkable. The inferior vena cava is grossly unremarkable. No retroperitoneal lymphadenopathy is seen. No pelvic sidewall lymphadenopathy is identified. Reproductive: The bladder is moderately distended and contains small stones. The prostate is normal in size, with scattered calcification. A small left inguinal hernia is noted, containing only fat. Other: No additional soft tissue abnormalities are seen. Musculoskeletal: No acute osseous abnormalities are identified. The visualized musculature is unremarkable in appearance. IMPRESSION: 1. Mildly loculated small right pleural effusion, with apparent peripheral enhancement. Underlying right-sided empyema cannot be excluded. Would correlate for any associated symptoms. Mild right basilar atelectasis seen. 2. Scattered aortic atherosclerosis. 3. Scattered diverticulosis along the sigmoid colon, without evidence of diverticulitis. 4. 6 mm stone at the lower pole of the left kidney. Bilateral renal cysts noted. 5. Small stones noted within the bladder. 6. Small left inguinal hernia, containing only fat. 7. Diffuse coronary artery calcifications seen. Electronically Signed   By: Garald Balding M.D.   On: 03/22/2017 22:15    Medications: I have reviewed the patient's current medications.  Assessment/Plan: 1. Lethargy; appears to be at baseline now according to the son. This was likely secondary to noncompliance with his noninvasive ventilation at night. Coronary records she has a history of chronic hypercapnic respiratory failure. Discussed with him and his son about  compliance with using this. There was not a blood gas checked in the ER. We'll go ahead and check an ABG. 2. Chronic hypercapnic neuromuscular respiratory failure. Patient was diagnosed with this after undergoing an extensive cervical spine surgery. 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 His treatment was oxygen and noninvasive ventilation at night. 3. Right pleural effusion. Appears to be loculated on the CT. Cannot appreciate infiltrates. However with his dysfunction and his diaphragm and likely chronic atelectasis he is at risk for developing pneumonia. The effusion could be parapneumonic. It is not large however at least getting a fluid sample to culture would probably be prudent.  4. Constipation. This is what his biggest complaints. We'll go ahead and start laxative therapy.  Total time spent 30 minutes  LOS: 0 days   Baxter Hire 02/27/2017, 11:27 AM

## 2017-02-27 NOTE — Consult Note (Signed)
Name: BASEM YANNUZZI Sr. MRN: [id number] DOB: 11/02/1945    ADMISSION DATE:  03/10/2017 CONSULTATION DATE:  02/27/17  REFERRING MD :  Dr. Posey Pronto  CHIEF COMPLAINT:  Shortness of breath  BRIEF PATIENT DESCRIPTION: 71 year old male admitted with lethargy and constipation.  Transferred from the floor for elevated CO2 requiring BiPAP  SIGNIFICANT EVENTS  7/5 >>Patient transferred from the floor with elevated CO2 requiring BiPAP  STUDIES:  05/21/16 ECHO>>The cavity size was normal. Wall thickness was  normal. Systolic function was normal. The estimated ejection  fraction was in the range of 55% to 65%.  HISTORY OF PRESENT ILLNESS:  Jaydien Panepinto is a 71 yo male with known history of Arthritis,BPH, Cervical pain, GERD,Gout, Arthritis,Hypertension,squamous cell carcinoma.   Patient presented to ED on 7/5  with lethargy and constipation.  Patient was noted to have small right lung pleural effusion and was admitted to the med -surg floor.  ON 7/5 patient was transferred from the floor due to hypercarbia requiring BiPAP.  However when the patient arrived on the unit patient was sating well On 3l of oxygen not in any acute distress.  PAST MEDICAL HISTORY :   has a past medical history of Arthritis; Blood clot associated with vein wall inflammation; BPH (benign prostatic hypertrophy); Cancer ([place]); Cervical pain (neck); GERD (gastroesophageal reflux disease); Gout; Headache; Heart murmur; Hemorrhoids; Hypertension; Insomnia; Nocturia associated with benign prostatic hypertrophy; Numbness and tingling of right arm; PONV (postoperative nausea and vomiting); Shortness of breath dyspnea; Squamous cell skin cancer; Stenosis of cervical spine with myelopathy; and Vertigo.  has a past surgical history that includes Lesion removal (Right, 10/26/2015); Skin graft (Right, 11/08/2015); Colonoscopy with propofol (N/A, 02/19/2016); Esophagogastroduodenoscopy (egd) with propofol (02/19/2016); Posterior cervical  fusion/foraminotomy (N/A, 05/09/2016); and Radiology with anesthesia (N/A, 06/11/2016). Prior to Admission medications   Medication Sig Start Date End Date Taking? Authorizing Provider  docusate sodium (COLACE) 100 MG capsule Take 100 mg by mouth 2 (two) times daily.   Yes [provider]  gabapentin (NEURONTIN) 300 MG capsule Take 300 mg by mouth every evening.    Yes [provider]  linaclotide (LINZESS) 290 MCG CAPS capsule Take 290 mcg by mouth daily before breakfast.   Yes [provider]  metoprolol tartrate (LOPRESSOR) 25 MG tablet Take 25 mg by mouth daily.    Yes [provider]  omeprazole (PRILOSEC) 40 MG capsule Take 40 mg by mouth daily.   Yes [provider]  ondansetron (ZOFRAN) 4 MG tablet Take 4 mg by mouth daily at 6 (six) AM.    Yes [provider]  oxyCODONE (OXYCONTIN) 10 mg 12 hr tablet Take 10 mg by mouth every 12 (twelve) hours. Patient takes at 8am and 8pm   Yes [provider]  pseudoephedrine-guaifenesin (MUCINEX D) 60-600 MG 12 hr tablet Take 1 tablet by mouth at bedtime.   Yes [provider]  tamsulosin (FLOMAX) 0.4 MG CAPS capsule Take 1 capsule (0.4 mg total) by mouth daily. Patient taking differently: Take 0.4 mg by mouth every evening.  01/16/16  Yes Sainani, Belia Heman, MD   Allergies  Allergen Reactions  . Atenolol Other (See Comments)    Reaction: unknown  Family states it either "made him sick to his stomach or very dizzy"  . Codeine Nausea And Vomiting  . Diltiazem Hcl Other (See Comments)    Reaction: unknown  Family states it either "made him sick to his stomach or very dizzy"    FAMILY HISTORY:  family history includes CAD in his father. SOCIAL HISTORY:  reports that he has never smoked. He has never used smokeless tobacco. He reports that he does not drink alcohol or use drugs.  REVIEW OF SYSTEMS:   Constitutional: Negative for fever, chills, weight loss, malaise/fatigue and  diaphoresis.  HENT: Negative for hearing loss, ear pain, nosebleeds, congestion, sore throat, neck pain, tinnitus and ear discharge.   Eyes: Negative for blurred vision, double vision, photophobia, pain, discharge and redness.  Respiratory: Negative for cough, hemoptysis, sputum production, shortness of breath, wheezing and stridor.   Cardiovascular: Negative for chest pain, palpitations, orthopnea, claudication, leg swelling and PND.  Gastrointestinal: Negative for heartburn, nausea, vomiting, abdominal pain, diarrhea, constipation, blood in stool and melena.  Genitourinary: Negative for dysuria, urgency, frequency, hematuria and flank pain.  Musculoskeletal: Negative for myalgias, back pain, joint pain and falls.  Skin: Negative for itching and rash.  Neurological: Negative for dizziness, tingling, tremors, sensory change, speech change, focal weakness, seizures, loss of consciousness, weakness and headaches.  Endo/Heme/Allergies: Negative for environmental allergies and polydipsia. Does not bruise/bleed easily.  SUBJECTIVE: Patient states "that he has been feeling well at this time"  VITAL SIGNS: Temp:  [97.4 F (36.3 C)-97.6 F (36.4 C)] 97.5 F (36.4 C) (07/05 1514) Pulse Rate:  [78-111] 81 (07/05 1514) Resp:  [17-38] 18 (07/05 1514) BP: (128-158)/(71-95) 158/85 (07/05 1514) SpO2:  [96 %-100 %] 98 % (07/05 1514) Weight:  [159 lb (72.1 kg)-170 lb (77.1 kg)] 159 lb (72.1 kg) (07/05 0403)  PHYSICAL EXAMINATION: General:  71 year old male , found on 3L of Oxygen, is not in any acute distress at this time Neuro:  Awake, Alert,Oriented HEENT: AT,[place],No jvd Cardiovascular: s1s2,Regular, no m/r/g noted Lungs:  Diminished bibasilar, no wheezes,crackles and rhonchi noted Abdomen:  Soft,nt,nd Musculoskeletal:  No edema,cyanosis Skin:  Warm,dry and intact   Recent Labs Lab 03/09/2017 2055 02/27/17 0712  NA 130* 126*  K 4.7 3.9  CL 76* 76*  CO2 49* 43*  BUN 12 7  CREATININE 0.50*  0.44*  GLUCOSE 97 108*    Recent Labs Lab 03/13/2017 2055 02/27/17 0712  HGB 12.8* 12.0*  HCT 38.7* 36.3*  WBC 6.4 6.7  PLT 133* 122*   Dg Chest 2 View  Result Date: 02/28/2017 CLINICAL DATA:  Abdominal pain and constipation. EXAM: CHEST  2 VIEW COMPARISON:  11/21/2016 CT and CXR FINDINGS: Low lung volumes with borderline cardiomegaly and aortic atherosclerosis. Bibasilar atelectasis. No overt pulmonary edema, effusion or pneumonic consolidations. Patchy bony demineralization of the bony thorax and shoulders. This is a nonspecific finding. IMPRESSION: Low lung volumes with atelectasis. No pneumonic consolidation or overt pulmonary edema. Patchy bony demineralization of the bony thorax and shoulders. Cannot exclude the possibility of myeloma. Electronically Signed   By: Ashley Royalty M.D.   On: 03/09/2017 23:33   [place] Chest  Result Date: 02/27/2017 CLINICAL DATA:  Right pleural effusion by abdominal CT with characteristics of potential small empyema. EXAM: CHEST ULTRASOUND COMPARISON:  CT of the abdomen on 03/09/2017 FINDINGS: Ultrasound demonstrates a small simple appearing pleural effusion on the right. The effusion is not large enough to safely perform thoracentesis. This effusion does not appear complex or loculated by ultrasound. IMPRESSION: Small right pleural effusion which is not large enough to warrant thoracentesis attempt. This effusion does not appear loculated or complex by ultrasound. Electronically Signed   By: Aletta Edouard M.D.   On: 02/27/2017 16:35   Ct Abdomen Pelvis W Contrast  Result Date: 02/28/2017  CLINICAL DATA:  Acute onset of constipation and generalized abdominal pain. Initial encounter. EXAM: CT ABDOMEN AND PELVIS WITH CONTRAST TECHNIQUE: Multidetector CT imaging of the abdomen and pelvis was performed using the standard protocol following bolus administration of intravenous contrast. CONTRAST:  142mL ISOVUE-300 IOPAMIDOL (ISOVUE-300) INJECTION 61% COMPARISON:  CT of the  abdomen and pelvis performed 05/19/2016 FINDINGS: Lower chest: A mildly loculated small right pleural effusion is noted, with apparent peripheral enhancement. Underlying right-sided empyema cannot be excluded. Mild right basilar atelectasis is seen. Diffuse coronary artery calcifications are seen. Hepatobiliary: Small calcified granulomata are seen within the liver. The liver is otherwise unremarkable. The gallbladder is unremarkable in appearance. The common bile duct remains normal in caliber. Pancreas: The pancreas is within normal limits. Spleen: The spleen is unremarkable in appearance. Adrenals/Urinary Tract: The adrenal glands are unremarkable in appearance. Bilateral renal cysts are noted. A 6 mm stone is noted at the lower pole of the left kidney. There is no evidence of hydronephrosis. No ureteral stones are identified. No perinephric stranding is seen. Stomach/Bowel: The stomach is unremarkable in appearance. The small bowel is within normal limits. The appendix is normal in caliber, without evidence of appendicitis. Scattered diverticulosis is noted along the sigmoid colon, without evidence of diverticulitis. Vascular/Lymphatic: Scattered calcification is seen along the abdominal aorta and its branches. The abdominal aorta is otherwise grossly unremarkable. The inferior vena cava is grossly unremarkable. No retroperitoneal lymphadenopathy is seen. No pelvic sidewall lymphadenopathy is identified. Reproductive: The bladder is moderately distended and contains small stones. The prostate is normal in size, with scattered calcification. A small left inguinal hernia is noted, containing only fat. Other: No additional soft tissue abnormalities are seen. Musculoskeletal: No acute osseous abnormalities are identified. The visualized musculature is unremarkable in appearance. IMPRESSION: 1. Mildly loculated small right pleural effusion, with apparent peripheral enhancement. Underlying right-sided empyema cannot  be excluded. Would correlate for any associated symptoms. Mild right basilar atelectasis seen. 2. Scattered aortic atherosclerosis. 3. Scattered diverticulosis along the sigmoid colon, without evidence of diverticulitis. 4. 6 mm stone at the lower pole of the left kidney. Bilateral renal cysts noted. 5. Small stones noted within the bladder. 6. Small left inguinal hernia, containing only fat. 7. Diffuse coronary artery calcifications seen. Electronically Signed   By: Garald Balding M.D.   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 On bowel Regimen    Bincy Varughese,AG-ACNP Pulmonary and [place]   02/27/2017, 7:56 PM

## 2017-02-27 NOTE — Progress Notes (Signed)
Patient a&o, VSS. Family at bedside earlier this am. No complaints at this time. Oxygen 4 liters. Tele afib. Continue to monitor.

## 2017-02-27 NOTE — H&P (Signed)
[facility] NAME: Benjamin Turner    MR#:  [id number]  DATE OF BIRTH:  02-Apr-1946  DATE OF ADMISSION:  03/23/2017  PRIMARY CARE PHYSICIAN: Marguerita Merles, MD   REQUESTING/REFERRING PHYSICIAN:   CHIEF COMPLAINT:   Chief Complaint  Patient presents with  . Constipation  . Altered Mental Status    HISTORY OF PRESENT ILLNESS: Benjamin Turner  is a 71 y.o. male with a known history of Arthritis, benign prostate hypertrophy, GERD, gout, hemorrhoids, hypertension, squamous cell skin cancer presented to the emergency room because of constipation and lethargy. Patient also complained of cough for the last couple of days. No fever and chills. His last bowel movement was 4 days ago and has mild abdominal discomfort. He was evaluated in the emergency room with CT abdomen and chest and pelvis which showed loculated pleural effusion. No history of fall or head injury. Patient is on oxygen at home and lives with his wife. Cough is productive for yellowish phlegm. Hospitalist service was consulted for further care of the patient.  PAST MEDICAL HISTORY:   Past Medical History:  Diagnosis Date  . Arthritis   . Blood clot associated with vein wall inflammation   . BPH (benign prostatic hypertrophy)   . Cancer ([place])   . Cervical pain (neck)   . GERD (gastroesophageal reflux disease)   . Gout   . Headache   . Heart murmur   . Hemorrhoids   . Hypertension   . Insomnia   . Nocturia associated with benign prostatic hypertrophy   . Numbness and tingling of right arm   . PONV (postoperative nausea and vomiting)    Problems tiliting and turning head  . Shortness of breath dyspnea    with exertion  . Squamous cell skin cancer    right ankle  . Stenosis of cervical spine with myelopathy   . Vertigo     PAST SURGICAL HISTORY: Past Surgical History:  Procedure Laterality Date  . COLONOSCOPY WITH PROPOFOL N/A 02/19/2016   Procedure:  COLONOSCOPY WITH PROPOFOL;  Surgeon: Manya Silvas, MD;  Location[facility] ENDOSCOPY;  Service: Endoscopy;  Laterality: N/A;  . ESOPHAGOGASTRODUODENOSCOPY (EGD) WITH PROPOFOL  02/19/2016   Procedure: ESOPHAGOGASTRODUODENOSCOPY (EGD) WITH PROPOFOL;  Surgeon: Manya Silvas, MD;  Location: [facility] ENDOSCOPY;  Service: Endoscopy;;  . LESION REMOVAL Right 10/26/2015   Procedure: EXCISION RIGHT ANKLE SKIN ;  Surgeon: Wallace Going, DO;  Location: [place];  Service: Plastics;  Laterality: Right;  . POSTERIOR CERVICAL FUSION/FORAMINOTOMY N/A 05/09/2016   Procedure: CERVICAL THREE-FOUR, CERVICAL FOUR-FIVE, CERIVCAL FIVE-SIX LAMINECTOMY WITH LATERAL MASS FUSION;  Surgeon: Consuella Lose, MD;  Location: [place] NEURO ORS;  Service: Neurosurgery;  Laterality: N/A;  LAMINECTOMY, LATERAL MASS FUSION C3-C6  . RADIOLOGY WITH ANESTHESIA N/A 06/11/2016   Procedure: MRI CERVICAL SPINE WITHOUT CONTRAST;  Surgeon: Medication Radiologist, MD;  Location: [place];  Service: Radiology;  Laterality: N/A;  . SKIN GRAFT Right 11/08/2015   Procedure: IRRIGATION AND DEBRIDEMENT AND SPLIT THICKNESS SKIN GRAFT RIGHT ANKLE ;  Surgeon: Wallace Going, DO;  Location: [place];  Service: Plastics;  Laterality: Right;    SOCIAL HISTORY:  Social History  Substance Use Topics  . Smoking status: Never Smoker  . Smokeless tobacco: Never Used  . Alcohol use No     Comment: rare beer    FAMILY HISTORY:  Family History  Problem Relation Age of Onset  . CAD Father   .  Diabetes Neg Hx   . Hypertension Neg Hx     DRUG ALLERGIES:  Allergies  Allergen Reactions  . Atenolol Other (See Comments)    Reaction: unknown  Family states it either "made him sick to his stomach or very dizzy"  . Codeine Nausea And Vomiting  . Diltiazem Hcl Other (See Comments)    Reaction: unknown  Family states it either "made him sick to his stomach or very dizzy"    REVIEW OF SYSTEMS:   CONSTITUTIONAL: No  fever, Has weakness.  EYES: No blurred or double vision.  EARS, NOSE, AND THROAT: No tinnitus or ear pain.  RESPIRATORY: Has cough, shortness of breath, No wheezing or hemoptysis.  CARDIOVASCULAR: No chest pain, orthopnea, edema.  GASTROINTESTINAL: No nausea, vomiting, diarrhea  Has  abdominal pain and constipation.  GENITOURINARY: No dysuria, hematuria.  ENDOCRINE: No polyuria, nocturia,  HEMATOLOGY: No anemia, easy bruising or bleeding SKIN: No rash or lesion. MUSCULOSKELETAL: No joint pain or arthritis.   NEUROLOGIC: No tingling, numbness, weakness.  PSYCHIATRY: No anxiety or depression.   MEDICATIONS AT HOME:  Prior to Admission medications   Medication Sig Start Date End Date Taking? Authorizing Provider  docusate sodium (COLACE) 100 MG capsule Take 100 mg by mouth 2 (two) times daily.   Yes [provider]  gabapentin (NEURONTIN) 300 MG capsule Take 300 mg by mouth every evening.    Yes [provider]  linaclotide (LINZESS) 290 MCG CAPS capsule Take 290 mcg by mouth daily before breakfast.   Yes [provider]  metoprolol tartrate (LOPRESSOR) 25 MG tablet Take 25 mg by mouth daily.    Yes [provider]  omeprazole (PRILOSEC) 40 MG capsule Take 40 mg by mouth daily.   Yes [provider]  ondansetron (ZOFRAN) 4 MG tablet Take 4 mg by mouth daily at 6 (six) AM.    Yes [provider]  oxyCODONE (OXYCONTIN) 10 mg 12 hr tablet Take 10 mg by mouth every 12 (twelve) hours. Patient takes at 8am and 8pm   Yes [provider]  pseudoephedrine-guaifenesin (MUCINEX D) 60-600 MG 12 hr tablet Take 1 tablet by mouth at bedtime.   Yes [provider]  tamsulosin (FLOMAX) 0.4 MG CAPS capsule Take 1 capsule (0.4 mg total) by mouth daily. Patient taking differently: Take 0.4 mg by mouth every evening.  01/16/16  Yes Sainani, Belia Heman, MD      PHYSICAL EXAMINATION:   VITAL SIGNS: Blood pressure (!) 147/71, pulse (!) 108,  temperature (!) 97.4 F (36.3 C), temperature source Oral, resp. rate 19, height 5\' 8"  (1.727 m), weight 77.1 kg (170 lb), SpO2 100 %.  GENERAL:  71 y.o.-year-old patient lying in the bed with no acute distress.  EYES: Pupils equal, round, reactive to light and accommodation. No scleral icterus. Extraocular muscles intact.  HEENT: Head atraumatic, normocephalic. Oropharynx and nasopharynx clear.  NECK:  Supple, no jugular venous distention. No thyroid enlargement, no tenderness.  LUNGS: Decreased breath sounds on right side, no wheezing, rales heard in right lung. No use of accessory muscles of respiration.  CARDIOVASCULAR: S1, S2 normal. No murmurs, rubs, or gallops.  ABDOMEN: Soft, mild tenderness around umbilicus, nondistended. Bowel sounds present. No organomegaly or mass.  EXTREMITIES: No pedal edema, cyanosis, or clubbing.  NEUROLOGIC: Cranial nerves II through XII are intact. Muscle strength 5/5 in all extremities. Sensation intact. Gait not checked.  PSYCHIATRIC: The patient is alert and oriented x 3.  SKIN: No obvious rash, lesion, or ulcer.  LABORATORY PANEL:   CBC  Recent Labs Lab 03/12/2017 2055  WBC 6.4  HGB 12.8*  HCT 38.7*  PLT 133*  MCV 87.1  MCH 28.7  MCHC 33.0  RDW 15.1*   ------------------------------------------------------------------------------------------------------------------  Chemistries   Recent Labs Lab 03/11/2017 2055  NA 130*  K 4.7  CL 76*  CO2 49*  GLUCOSE 97  BUN 12  CREATININE 0.50*  CALCIUM 10.0  AST 17  ALT 13*  ALKPHOS 74  BILITOT 0.8   ------------------------------------------------------------------------------------------------------------------ estimated creatinine clearance is 81.9 mL/min (A) (by C-G formula based on SCr of 0.5 mg/dL (L)). ------------------------------------------------------------------------------------------------------------------ No results for input(s): TSH, T4TOTAL, T3FREE, THYROIDAB in the last  72 hours.  Invalid input(s): FREET3   Coagulation profile No results for input(s): INR, PROTIME in the last 168 hours. ------------------------------------------------------------------------------------------------------------------- No results for input(s): DDIMER in the last 72 hours. -------------------------------------------------------------------------------------------------------------------  Cardiac Enzymes  Recent Labs Lab 03/05/2017 2055  TROPONINI <0.03   ------------------------------------------------------------------------------------------------------------------ Invalid input(s): POCBNP  ---------------------------------------------------------------------------------------------------------------  Urinalysis    Component Value Date/Time   COLORURINE YELLOW (A) 03/01/2017 2008   APPEARANCEUR HAZY (A) 03/04/2017 2008   LABSPEC 1.010 03/22/2017 2008   PHURINE 8.0 03/22/2017 2008   GLUCOSEU NEGATIVE 03/01/2017 2008   HGBUR NEGATIVE 03/03/2017 2008   BILIRUBINUR NEGATIVE 03/01/2017 2008   [place] NEGATIVE 03/09/2017 2008   PROTEINUR NEGATIVE 03/01/2017 2008   NITRITE NEGATIVE 02/25/2017 2008   LEUKOCYTESUR NEGATIVE 03/24/2017 2008     RADIOLOGY: Dg Chest 2 View  Result Date: 03/15/2017 CLINICAL DATA:  Abdominal pain and constipation. EXAM: CHEST  2 VIEW COMPARISON:  11/21/2016 CT and CXR FINDINGS: Low lung volumes with borderline cardiomegaly and aortic atherosclerosis. Bibasilar atelectasis. No overt pulmonary edema, effusion or pneumonic consolidations. Patchy bony demineralization of the bony thorax and shoulders. This is a nonspecific finding. IMPRESSION: Low lung volumes with atelectasis. No pneumonic consolidation or overt pulmonary edema. Patchy bony demineralization of the bony thorax and shoulders. Cannot exclude the possibility of myeloma. Electronically Signed   By: Ashley Royalty M.D.   On: 03/12/2017 23:33   Ct Abdomen Pelvis W Contrast  Result  Date: 03/21/2017 CLINICAL DATA:  Acute onset of constipation and generalized abdominal pain. Initial encounter. EXAM: CT ABDOMEN AND PELVIS WITH CONTRAST TECHNIQUE: Multidetector CT imaging of the abdomen and pelvis was performed using the standard protocol following bolus administration of intravenous contrast. CONTRAST:  128mL ISOVUE-300 IOPAMIDOL (ISOVUE-300) INJECTION 61% COMPARISON:  CT of the abdomen and pelvis performed 05/19/2016 FINDINGS: Lower chest: A mildly loculated small right pleural effusion is noted, with apparent peripheral enhancement. Underlying right-sided empyema cannot be excluded. Mild right basilar atelectasis is seen. Diffuse coronary artery calcifications are seen. Hepatobiliary: Small calcified granulomata are seen within the liver. The liver is otherwise unremarkable. The gallbladder is unremarkable in appearance. The common bile duct remains normal in caliber. Pancreas: The pancreas is within normal limits. Spleen: The spleen is unremarkable in appearance. Adrenals/Urinary Tract: The adrenal glands are unremarkable in appearance. Bilateral renal cysts are noted. A 6 mm stone is noted at the lower pole of the left kidney. There is no evidence of hydronephrosis. No ureteral stones are identified. No perinephric stranding is seen. Stomach/Bowel: The stomach is unremarkable in appearance. The small bowel is within normal limits. The appendix is normal in caliber, without evidence of appendicitis. Scattered diverticulosis is noted along the sigmoid colon, without evidence of diverticulitis. Vascular/Lymphatic: Scattered calcification is seen along the abdominal aorta and its branches. The abdominal aorta is otherwise grossly unremarkable. The inferior  vena cava is grossly unremarkable. No retroperitoneal lymphadenopathy is seen. No pelvic sidewall lymphadenopathy is identified. Reproductive: The bladder is moderately distended and contains small stones. The prostate is normal in size, with  scattered calcification. A small left inguinal hernia is noted, containing only fat. Other: No additional soft tissue abnormalities are seen. Musculoskeletal: No acute osseous abnormalities are identified. The visualized musculature is unremarkable in appearance. IMPRESSION: 1. Mildly loculated small right pleural effusion, with apparent peripheral enhancement. Underlying right-sided empyema cannot be excluded. Would correlate for any associated symptoms. Mild right basilar atelectasis seen. 2. Scattered aortic atherosclerosis. 3. Scattered diverticulosis along the sigmoid colon, without evidence of diverticulitis. 4. 6 mm stone at the lower pole of the left kidney. Bilateral renal cysts noted. 5. Small stones noted within the bladder. 6. Small left inguinal hernia, containing only fat. 7. Diffuse coronary artery calcifications seen. Electronically Signed   By: Garald Balding M.D.   On: 03/05/2017 22:15    EKG: Orders placed or performed during the hospital encounter of 03/13/2017  . ED EKG  . ED EKG    IMPRESSION AND PLAN: 71 year old male patient with history of hypertension, GERD, squamous cell skin cancer presented to the emergency room with lethargy, cough and constipation. Admitting diagnosis 1. Right lung pleural effusion 2. Community-acquired pneumonia 3. Hypertension 4. Constipation 5. GERD Treatment plan Admit patient to medical floor Start patient on IV Rocephin and IV Zithromax antibiotic Stool softeners Pulmonary consultation for right lung effusion DVT prophylaxis with subcutaneous Lovenox 40 MG daily  All the records are reviewed and case discussed with ED provider. Management plans discussed with the patient, family and they are in agreement.  CODE STATUS:FULL CODE  surrogate decision maker wife  Code Status History    Date Active Date Inactive Code Status Order ID Comments User Context   06/07/2016  4:30 PM 06/12/2016  7:48 PM Full Code [id number]  Elwin Mocha, MD  Inpatient   05/20/2016  6:59 AM 05/21/2016  6:51 AM Full Code [id number]  Harrie Foreman, MD Inpatient   05/09/2016  6:47 PM 05/15/2016  9:00 PM Full Code [id number]  Consuella Lose, MD Inpatient   01/14/2016 10:32 PM 01/16/2016  7:57 PM Full Code [id number]  Lance Coon, MD Inpatient   09/14/2015 10:02 PM 09/16/2015  7:42 PM Full Code [id number]  Toy Baker, MD Inpatient       TOTAL TIME TAKING CARE OF THIS PATIENT: 52 minutes.    Saundra Shelling M.D on 02/27/2017 at 3:26 AM  Between 7am to 6pm - Pager - [phone]  After 6pm go to [email or web address] - password [email or web address] [facility]  [place] [facility]  Office  [phone]  CC: Primary care physician; Marguerita Merles, MD

## 2017-02-28 DIAGNOSIS — J9622 Acute and chronic respiratory failure with hypercapnia: Secondary | ICD-10-CM

## 2017-02-28 LAB — BLOOD GAS, ARTERIAL
Acid-Base Excess: 20.7 mmol/L — ABNORMAL HIGH (ref 0.0–2.0)
Allens test (pass/fail): POSITIVE — AB
BICARBONATE: 48 mmol/L — AB (ref 20.0–28.0)
FIO2: 0.32
FIO2: 0.28
O2 Saturation: 94.2 %
PATIENT TEMPERATURE: 37
PH ART: 7.19 — AB (ref 7.350–7.450)
PH ART: 7.49 — AB (ref 7.350–7.450)
Patient temperature: 37
pCO2 arterial: 120 mmHg (ref 32.0–48.0)
pCO2 arterial: 63 mmHg — ABNORMAL HIGH (ref 32.0–48.0)
pO2, Arterial: 125 mmHg — ABNORMAL HIGH (ref 83.0–108.0)
pO2, Arterial: 66 mmHg — ABNORMAL LOW (ref 83.0–108.0)

## 2017-02-28 LAB — GLUCOSE, CAPILLARY
Glucose-Capillary: 124 mg/dL — ABNORMAL HIGH (ref 65–99)
Glucose-Capillary: 98 mg/dL (ref 65–99)

## 2017-02-28 MED ORDER — METOPROLOL TARTRATE 5 MG/5ML IV SOLN
2.5000 mg | INTRAVENOUS | Status: DC | PRN
  Administered 2017-02-28: 5 mg via INTRAVENOUS
  Filled 2017-02-28: qty 5

## 2017-02-28 MED ORDER — SODIUM CHLORIDE 0.9 % IV SOLN
INTRAVENOUS | Status: DC
  Administered 2017-02-28: 11:00:00 via INTRAVENOUS
  Administered 2017-02-28: 75 mL/h via INTRAVENOUS
  Administered 2017-03-01 – 2017-03-02 (×2): via INTRAVENOUS

## 2017-02-28 MED ORDER — METHYLPREDNISOLONE SODIUM SUCC 125 MG IJ SOLR
60.0000 mg | Freq: Four times a day (QID) | INTRAMUSCULAR | Status: DC
  Administered 2017-02-28 – 2017-03-02 (×9): 60 mg via INTRAVENOUS
  Filled 2017-02-28 (×8): qty 2

## 2017-02-28 MED ORDER — MORPHINE SULFATE (PF) 2 MG/ML IV SOLN
1.0000 mg | INTRAVENOUS | Status: DC | PRN
  Administered 2017-02-28 – 2017-03-02 (×3): 2 mg via INTRAVENOUS
  Filled 2017-02-28 (×3): qty 1

## 2017-02-28 MED ORDER — DEXMEDETOMIDINE HCL 200 MCG/2ML IV SOLN
0.4000 ug/kg/h | INTRAVENOUS | Status: AC
  Administered 2017-02-28 (×4): 1.2 ug/kg/h via INTRAVENOUS
  Administered 2017-02-28: 0.4 ug/kg/h via INTRAVENOUS
  Administered 2017-03-01: 1 ug/kg/h via INTRAVENOUS
  Administered 2017-03-01 (×2): 1.2 ug/kg/h via INTRAVENOUS
  Administered 2017-03-01: 1.4 ug/kg/h via INTRAVENOUS
  Filled 2017-02-28 (×10): qty 2

## 2017-02-28 NOTE — Plan of Care (Signed)
Problem: Safety: Goal: Ability to remain free from injury will improve Outcome: Progressing Patient remained free from injury from injury this shift- no new injury noted  Problem: Health Behavior/Discharge Planning: Goal: Ability to manage health-related needs will improve Outcome: Progressing Patient more arousable and interactive with nurse this afternoon. Patient taken off bipap and placed on nasal cannula. Patient on Precedex Drip to help with agitation. IV Fluids Infusing  Problem: Pain Managment: Goal: General experience of comfort will improve Outcome: Progressing Patient denies any pain  Problem: Physical Regulation: Goal: Will remain free from infection Outcome: Progressing No new signs or symptoms of infection noted  Problem: Skin Integrity: Goal: Risk for impaired skin integrity will decrease Outcome: Progressing No new skin breakdown noted  Problem: Fluid Volume: Goal: Ability to maintain a balanced intake and output will improve Outcome: Progressing Patient progressed to Clear Liquid Diet, no appetite- refusing PO intake at this time.

## 2017-02-28 NOTE — Progress Notes (Signed)
PT Cancellation Note  Patient Details Name: Benjamin WHETSELL Sr. MRN: [id number] DOB: 09-16-1945   Cancelled Treatment:    Reason Eval/Treat Not Completed: Other (comment).  Since PT consult has been ordered, pt has transferred from floor to CCU, back to floor, and back to CCU again d/t medical issues.  D/t pt transferring to higher level of care, per PT protocol require new PT consult in order to continue therapy (will discontinue current PT order d/t this).  Please re-consult PT when pt is medically appropriate to participate in PT.  Leitha Bleak, PT 02/28/17, 2:34 PM [phone]

## 2017-02-28 NOTE — Progress Notes (Signed)
Pt transferred to stepdown to initiate bipap due to worsening ABG's. Pt's without obvious signs of distress at time of transfer.

## 2017-02-28 NOTE — Progress Notes (Signed)
Subjective: Patient alert but having trouble talking. He answers some questions appropriately. His wife and son at the bedside and say this is the otherwise been last couple of days and this is a change for him. He did wear his BiPAP last night.  Objective: Vital signs in last 24 hours: Temp:  [96.8 F (36 C)-98.4 F (36.9 C)] 96.8 F (36 C) (07/06 0855) Pulse Rate:  [81-102] 98 (07/06 0800) Resp:  [16-36] 20 (07/06 0800) BP: (85-158)/(58-88) 117/65 (07/06 0800) SpO2:  [94 %-100 %] 94 % (07/06 0800) Weight:  [71.8 kg (158 lb 4.6 oz)] 71.8 kg (158 lb 4.6 oz) (07/05 2000) Weight change: -5.312 kg (-11 lb 11.4 oz) Last BM Date: 02/23/17  Intake/Output from previous day: 07/05 0701 - 07/06 0700 In: 789 [P.O.:480; I.V.:9; IV Piggyback:300] Out: 300 [Urine:300] Intake/Output this shift: No intake/output data recorded.  General appearance: alert Resp: clear to auscultation bilaterally and normal percussion bilaterally Cardio: regular rate and rhythm, S1, S2 normal, no murmur, click, rub or gallop GI: soft, non-tender; bowel sounds normal; no masses,  no organomegaly Neurologic: Mental status: Alert, oriented, thought content appropriate, Alert but having difficulty with his words. Follows commands. Moves all extremities  Lab Results:  Recent Labs  03/15/2017 2055 02/27/17 0712  WBC 6.4 6.7  HGB 12.8* 12.0*  HCT 38.7* 36.3*  PLT 133* 122*   BMET  Recent Labs  02/25/2017 2055 02/27/17 0712  NA 130* 126*  K 4.7 3.9  CL 76* 76*  CO2 49* 43*  GLUCOSE 97 108*  BUN 12 7  CREATININE 0.50* 0.44*  CALCIUM 10.0 9.2    Studies/Results: Dg Chest 2 View  Result Date: 03/12/2017 CLINICAL DATA:  Abdominal pain and constipation. EXAM: CHEST  2 VIEW COMPARISON:  11/21/2016 CT and CXR FINDINGS: Low lung volumes with borderline cardiomegaly and aortic atherosclerosis. Bibasilar atelectasis. No overt pulmonary edema, effusion or pneumonic consolidations. Patchy bony demineralization of the  bony thorax and shoulders. This is a nonspecific finding. IMPRESSION: Low lung volumes with atelectasis. No pneumonic consolidation or overt pulmonary edema. Patchy bony demineralization of the bony thorax and shoulders. Cannot exclude the possibility of myeloma. Electronically Signed   By: Ashley Royalty M.D.   On: 03/03/2017 23:33   [place] Chest  Result Date: 02/27/2017 CLINICAL DATA:  Right pleural effusion by abdominal CT with characteristics of potential small empyema. EXAM: CHEST ULTRASOUND COMPARISON:  CT of the abdomen on 03/06/2017 FINDINGS: Ultrasound demonstrates a small simple appearing pleural effusion on the right. The effusion is not large enough to safely perform thoracentesis. This effusion does not appear complex or loculated by ultrasound. IMPRESSION: Small right pleural effusion which is not large enough to warrant thoracentesis attempt. This effusion does not appear loculated or complex by ultrasound. Electronically Signed   By: Aletta Edouard M.D.   On: 02/27/2017 16:35   Ct Abdomen Pelvis W Contrast  Result Date: 03/24/2017 CLINICAL DATA:  Acute onset of constipation and generalized abdominal pain. Initial encounter. EXAM: CT ABDOMEN AND PELVIS WITH CONTRAST TECHNIQUE: Multidetector CT imaging of the abdomen and pelvis was performed using the standard protocol following bolus administration of intravenous contrast. CONTRAST:  176mL ISOVUE-300 IOPAMIDOL (ISOVUE-300) INJECTION 61% COMPARISON:  CT of the abdomen and pelvis performed 05/19/2016 FINDINGS: Lower chest: A mildly loculated small right pleural effusion is noted, with apparent peripheral enhancement. Underlying right-sided empyema cannot be excluded. Mild right basilar atelectasis is seen. Diffuse coronary artery calcifications are seen. Hepatobiliary: Small calcified granulomata are seen within the liver.  The liver is otherwise unremarkable. The gallbladder is unremarkable in appearance. The common bile duct remains normal in caliber.  Pancreas: The pancreas is within normal limits. Spleen: The spleen is unremarkable in appearance. Adrenals/Urinary Tract: The adrenal glands are unremarkable in appearance. Bilateral renal cysts are noted. A 6 mm stone is noted at the lower pole of the left kidney. There is no evidence of hydronephrosis. No ureteral stones are identified. No perinephric stranding is seen. Stomach/Bowel: The stomach is unremarkable in appearance. The small bowel is within normal limits. The appendix is normal in caliber, without evidence of appendicitis. Scattered diverticulosis is noted along the sigmoid colon, without evidence of diverticulitis. Vascular/Lymphatic: Scattered calcification is seen along the abdominal aorta and its branches. The abdominal aorta is otherwise grossly unremarkable. The inferior vena cava is grossly unremarkable. No retroperitoneal lymphadenopathy is seen. No pelvic sidewall lymphadenopathy is identified. Reproductive: The bladder is moderately distended and contains small stones. The prostate is normal in size, with scattered calcification. A small left inguinal hernia is noted, containing only fat. Other: No additional soft tissue abnormalities are seen. Musculoskeletal: No acute osseous abnormalities are identified. The visualized musculature is unremarkable in appearance. IMPRESSION: 1. Mildly loculated small right pleural effusion, with apparent peripheral enhancement. Underlying right-sided empyema cannot be excluded. Would correlate for any associated symptoms. Mild right basilar atelectasis seen. 2. Scattered aortic atherosclerosis. 3. Scattered diverticulosis along the sigmoid colon, without evidence of diverticulitis. 4. 6 mm stone at the lower pole of the left kidney. Bilateral renal cysts noted. 5. Small stones noted within the bladder. 6. Small left inguinal hernia, containing only fat. 7. Diffuse coronary artery calcifications seen. Electronically Signed   By: Garald Balding M.D.   On:  02/25/2017 22:15    Medications: I have reviewed the patient's current medications.  Assessment/Plan: 1. Lethargy;  he is alert but has difficulty getting his words. Does answer some questions appropriately. He has trouble bending his neck and cannot lay flat so cannot get CT or MRI to evaluate for any neurological issues. Suspect this could all be from his hypercapnia. 2. Chronic hypercapnic neuromuscular respiratory failure. Patient was diagnosed with this after undergoing an extensive cervical spine surgery. 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 He did wear the BiPAP last night and PCO2 had improved. However his mental status has not significantly improved. Continue nocturnal BiPAP.Marland Kitchen 3. Right pleural effusion. Ultrasound revealed there was not enough fluid to drain.  4. Constipation. This is what his biggest complaints. We'll go ahead and start laxative therapy  Total time spent 20 minutes.  LOS: 1 day   Baxter Hire 02/28/2017, 2:42 PM

## 2017-02-28 NOTE — Progress Notes (Signed)
Patient transferred to 202. Report given to [place], Therapist, sports.

## 2017-03-01 LAB — CBC
HEMATOCRIT: 35.2 % — AB (ref 40.0–52.0)
Hemoglobin: 12 g/dL — ABNORMAL LOW (ref 13.0–18.0)
MCH: 28.9 pg (ref 26.0–34.0)
MCHC: 33.9 g/dL (ref 32.0–36.0)
MCV: 85.2 fL (ref 80.0–100.0)
Platelets: 115 10*3/uL — ABNORMAL LOW (ref 150–440)
RBC: 4.13 MIL/uL — ABNORMAL LOW (ref 4.40–5.90)
RDW: 14.6 % — AB (ref 11.5–14.5)
WBC: 3.2 10*3/uL — ABNORMAL LOW (ref 3.8–10.6)

## 2017-03-01 LAB — BASIC METABOLIC PANEL
Anion gap: 7 (ref 5–15)
BUN: 20 mg/dL (ref 6–20)
CALCIUM: 9.5 mg/dL (ref 8.9–10.3)
CO2: 40 mmol/L — AB (ref 22–32)
CREATININE: 0.65 mg/dL (ref 0.61–1.24)
Chloride: 78 mmol/L — ABNORMAL LOW (ref 101–111)
GFR calc non Af Amer: >60 mL/min (ref >60)
GLUCOSE: 126 mg/dL — AB (ref 65–99)
Potassium: 4.5 mmol/L (ref 3.5–5.1)
Sodium: 125 mmol/L — ABNORMAL LOW (ref 135–145)

## 2017-03-01 LAB — MAGNESIUM: Magnesium: 1.5 mg/dL — ABNORMAL LOW (ref 1.7–2.4)

## 2017-03-01 LAB — OSMOLALITY, URINE: OSMOLALITY UR: 401 mosm/kg (ref 300–900)

## 2017-03-01 LAB — PHOSPHORUS: Phosphorus: 2.4 mg/dL — ABNORMAL LOW (ref 2.5–4.6)

## 2017-03-01 LAB — CHLORIDE, URINE, RANDOM: Chloride Urine: 22 mmol/L

## 2017-03-01 LAB — SODIUM, URINE, RANDOM: Sodium, Ur: 24 mmol/L

## 2017-03-01 LAB — OSMOLALITY: Osmolality: 268 mOsm/kg — ABNORMAL LOW (ref 275–295)

## 2017-03-01 MED ORDER — SODIUM CHLORIDE 0.9 % IV SOLN
0.4000 ug/kg/h | INTRAVENOUS | Status: DC
  Administered 2017-03-01: 1 ug/kg/h via INTRAVENOUS
  Filled 2017-03-01: qty 2
  Filled 2017-03-01 (×2): qty 4

## 2017-03-01 MED ORDER — SODIUM PHOSPHATES 45 MMOLE/15ML IV SOLN
30.0000 mmol | Freq: Once | INTRAVENOUS | Status: AC
  Administered 2017-03-01: 30 mmol via INTRAVENOUS
  Filled 2017-03-01: qty 10

## 2017-03-01 MED ORDER — MAGNESIUM SULFATE 2 GM/50ML IV SOLN
INTRAVENOUS | Status: AC
  Administered 2017-03-01: 2 g via INTRAVENOUS
  Filled 2017-03-01: qty 50

## 2017-03-01 MED ORDER — BISACODYL 10 MG RE SUPP
10.0000 mg | Freq: Once | RECTAL | Status: AC
  Administered 2017-03-01: 10 mg via RECTAL
  Filled 2017-03-01: qty 1

## 2017-03-01 MED ORDER — HALOPERIDOL LACTATE 5 MG/ML IJ SOLN
INTRAMUSCULAR | Status: AC
  Administered 2017-03-01: 5 mg via INTRAVENOUS
  Filled 2017-03-01: qty 1

## 2017-03-01 MED ORDER — MAGNESIUM SULFATE 2 GM/50ML IV SOLN: 2.0000 g | Freq: Once | INTRAVENOUS | Status: DC

## 2017-03-01 MED ORDER — MAGNESIUM SULFATE 2 GM/50ML IV SOLN
2.0000 g | Freq: Once | INTRAVENOUS | Status: AC
  Administered 2017-03-01: 2 g via INTRAVENOUS

## 2017-03-01 MED ORDER — HALOPERIDOL LACTATE 5 MG/ML IJ SOLN
5.0000 mg | Freq: Once | INTRAMUSCULAR | Status: AC
  Administered 2017-03-01: 5 mg via INTRAVENOUS

## 2017-03-01 MED ORDER — LORAZEPAM 2 MG/ML IJ SOLN
0.5000 mg | Freq: Once | INTRAMUSCULAR | Status: AC
  Administered 2017-03-01: 0.5 mg via INTRAVENOUS
  Filled 2017-03-01: qty 1

## 2017-03-01 NOTE — Progress Notes (Signed)
[facility] Progess Note    SYNOPSIS   Benjamin Turner is a 71 yo male with known history of Arthritis,BPH, Cervical pain, GERD,Gout, Arthritis,Hypertension,squamous cell carcinoma.   Patient presented to ED on 7/5  with lethargy and constipation.  Patient was noted to have small right lung pleural effusion and was admitted to the med -surg floor.  ON 7/5 patient was transferred from the floor due to hypercarbia requiring BiPAP.  However when the patient arrived on the unit patient was sating well On 3l of oxygen not in any acute distress.   ASSESSMENT/PLAN  Chronic hypercapnic respiratory failure. Has been on and off of BiPAP last night. On Solu-Medrol, albuterol, Atrovent, Azithromycin, Rocephin  Encephalopathy. Patient is presently on Precedex  Metabolic alkalosis. Will send spot urinary chloride to evaluate for chloride responsive about alkalosis. We'll also send urine osmolality  Hyponatremia. Will measure urinary sodium, assess fractional excretion, saline resuscitation for now  Hypochloremia. Saline resuscitation  Hypomagnesemia. We'll replace magnesium  Hypophosphatemia. Mildly low will follow up  Leukopenia. Will follow, on broad-spectrum coverage at this point   INTAKE / OUTPUT:  Intake/Output Summary (Last 24 hours) at 03/01/17 0908 Last data filed at 03/01/17 0800  Gross per 24 hour  Intake          2838.45 ml  Output                0 ml  Net          2838.45 ml    Name: Benjamin REAMY Sr. MRN: [id number] DOB: 10-10-1945    ADMISSION DATE:  03/15/2017  SUBJECTIVE:   VITAL SIGNS: Temp:  [97.8 F (36.6 C)-98.7 F (37.1 C)] 98.4 F (36.9 C) (07/07 0800) Pulse Rate:  [79-121] 113 (07/07 0800) Resp:  [13-37] 30 (07/07 0800) BP: (97-172)/(70-106) 148/99 (07/07 0800) SpO2:  [95 %-99 %] 97 % (07/07 0800)   PHYSICAL EXAMINATION: Physical Examination:   VS: BP (!) 148/99 (BP Location: Right Arm)   Pulse (!) 113   Temp 98.4 F  (36.9 C) (Oral)   Resp (!) 30   Ht 5\' 8"  (1.727 m)   Wt 71.8 kg (158 lb 4.6 oz)   SpO2 97%   BMI 24.07 kg/m   General Appearance: No distress  Neuro:without focal findings, mental status normal. HEENT: PERRLA, EOM intact. Pulmonary: normal breath sounds   CardiovascularNormal S1,S2.  No m/r/g.   Abdomen: Benign, Soft, non-tender. Renal:  No costovertebral tenderness  GU:  Not performed at this time. Endocrine: No evident thyromegaly. Skin:   warm, no rashes, no ecchymosis  Extremities: normal, no cyanosis, clubbing.    LABORATORY PANEL:   CBC  Recent Labs Lab 03/01/17 0429  WBC 3.2*  HGB 12.0*  HCT 35.2*  PLT 115*    Chemistries   Recent Labs Lab 03/14/2017 2055  03/01/17 0429  NA 130*  < > 125*  K 4.7  < > 4.5  CL 76*  < > 78*  CO2 49*  < > 40*  GLUCOSE 97  < > 126*  BUN 12  < > 20  CREATININE 0.50*  < > 0.65  CALCIUM 10.0  < > 9.5  MG  --   --  1.5*  PHOS  --   --  2.4*  AST 17  --   --   ALT 13*  --   --   ALKPHOS 74  --   --   BILITOT 0.8  --   --   < > =  values in this interval not displayed.   Recent Labs Lab 02/27/17 1950 02/28/17 0704  GLUCAP 124* 98    Recent Labs Lab 02/27/17 1225 02/28/17 0555 02/28/17 1429  PHART 7.30* 7.19* 7.49*  PCO2ART 116* 120* 63*  PO2ART 189* 125* 66*    Recent Labs Lab 02/25/2017 2055  AST 17  ALT 13*  ALKPHOS 74  BILITOT 0.8  ALBUMIN 3.7    Cardiac Enzymes  Recent Labs Lab 03/10/2017 2055  TROPONINI <0.03    RADIOLOGY:  [place] Chest  Result Date: 02/27/2017 CLINICAL DATA:  Right pleural effusion by abdominal CT with characteristics of potential small empyema. EXAM: CHEST ULTRASOUND COMPARISON:  CT of the abdomen on 02/25/2017 FINDINGS: Ultrasound demonstrates a small simple appearing pleural effusion on the right. The effusion is not large enough to safely perform thoracentesis. This effusion does not appear complex or loculated by ultrasound. IMPRESSION: Small right pleural effusion which is  not large enough to warrant thoracentesis attempt. This effusion does not appear loculated or complex by ultrasound. Electronically Signed   By: Aletta Edouard M.D.   On: 02/27/2017 16:35    [name], DO ICU Pager: [phone]  Pulmonary and Critical Care Office Number: [phone]   03/01/2017

## 2017-03-01 NOTE — Progress Notes (Signed)
Patient ID: Benjamin AVETISYAN Sr., male   DOB: 1945/09/24, 71 y.o.   MRN: [id number]  [facility] PROGRESS NOTE  Benjamin BALKE Sr. [id number]:[id number] DOB: [birth year] DOA: 02/23/2017 PCP: Marguerita Merles, MD  HPI/Subjective: Patient awakens from sleep when I was palpating his abdomen. He stated he feels terrible. Hard to understand when he was trying to say to me. Did answer some yes or no questions. As per nursing staff and respiratory, he did not wear his BiPAP last night for too long.  Objective: Vitals:   03/01/17 1000 03/01/17 1100  BP: 100/64 112/69  Pulse: 86 79  Resp: 19 17  Temp:      Filed Weights   03/23/2017 2005 02/27/17 0403 02/27/17 2000  Weight: 77.1 kg (170 lb) 72.1 kg (159 lb) 71.8 kg (158 lb 4.6 oz)    ROS: Review of Systems  Unable to perform ROS: Acuity of condition  Respiratory: Positive for shortness of breath.   Cardiovascular: Negative for chest pain.  Gastrointestinal: Negative for abdominal pain, constipation, diarrhea, nausea and vomiting.   Exam: Physical Exam  Constitutional: He appears lethargic.  HENT:  Nose: No mucosal edema.  Mouth/Throat: No oropharyngeal exudate.  Eyes: Conjunctivae and lids are normal. Pupils are equal, round, and reactive to light.  Neck: No JVD present. Carotid bruit is not present. No edema present. No thyroid mass and no thyromegaly present.  Cardiovascular: Regular rhythm, S1 normal, S2 normal and normal heart sounds.  Exam reveals no gallop.   No murmur heard. Pulses:      Dorsalis pedis pulses are 2+ on the right side, and 2+ on the left side.  Respiratory: Accessory muscle usage present. He has decreased breath sounds in the right middle field, the right lower field, the left middle field and the left lower field. He has no wheezes. He has no rhonchi. He has no rales.  GI: Soft. Bowel sounds are normal. There is no tenderness.  Musculoskeletal:       Right ankle: He exhibits swelling.       Left ankle: He exhibits  swelling.  Lymphadenopathy:    He has no cervical adenopathy.  Neurological: He appears lethargic.  Skin: Skin is warm. No rash noted. Nails show no clubbing.  Psychiatric:  Lethargic but did arouse to answer a few questions.      Data Reviewed: Basic Metabolic Panel:  Recent Labs Lab 02/25/2017 2055 02/27/17 0712 03/01/17 0429  NA 130* 126* 125*  K 4.7 3.9 4.5  CL 76* 76* 78*  CO2 49* 43* 40*  GLUCOSE 97 108* 126*  BUN 12 7 20   CREATININE 0.50* 0.44* 0.65  CALCIUM 10.0 9.2 9.5  MG  --   --  1.5*  PHOS  --   --  2.4*   Liver Function Tests:  Recent Labs Lab 03/01/2017 2055  AST 17  ALT 13*  ALKPHOS 74  BILITOT 0.8  PROT 6.6  ALBUMIN 3.7   CBC:  Recent Labs Lab 03/06/2017 2055 02/27/17 0712 03/01/17 0429  WBC 6.4 6.7 3.2*  HGB 12.8* 12.0* 12.0*  HCT 38.7* 36.3* 35.2*  MCV 87.1 87.4 85.2  PLT 133* 122* 115*   Cardiac Enzymes:  Recent Labs Lab 03/13/2017 2055  TROPONINI <0.03   BNP (last 3 results)  Recent Labs  11/21/16 0642  BNP 42.0     CBG:  Recent Labs Lab 02/27/17 1950 02/28/17 0704  GLUCAP 124* 98    Recent Results (from the past 240 hour(s))  Blood Culture (routine x 2)     Status: None (Preliminary result)   Collection Time: 02/25/2017 11:42 PM  Result Value Ref Range Status   Specimen Description BLOOD [facility]  Final   Special Requests   Final    BOTTLES DRAWN AEROBIC AND ANAEROBIC Blood Culture adequate volume   Culture NO GROWTH 3 DAYS  Final   Report Status PENDING  Incomplete  Blood Culture (routine x 2)     Status: None (Preliminary result)   Collection Time: 03/16/2017 11:42 PM  Result Value Ref Range Status   Specimen Description BLOOD [facility]  Final   Special Requests   Final    BOTTLES DRAWN AEROBIC AND ANAEROBIC Blood Culture adequate volume   Culture NO GROWTH 3 DAYS  Final   Report Status PENDING  Incomplete  MRSA PCR Screening     Status: None   Collection Time: 02/27/17  7:50 PM  Result Value Ref Range Status   MRSA  by PCR NEGATIVE NEGATIVE Final    Comment:        The GeneXpert MRSA Assay (FDA approved for NASAL specimens only), is one component of a comprehensive MRSA colonization surveillance program. It is not intended to diagnose MRSA infection nor to guide or monitor treatment for MRSA infections.      Studies: [place] Chest  Result Date: 02/27/2017 CLINICAL DATA:  Right pleural effusion by abdominal CT with characteristics of potential small empyema. EXAM: CHEST ULTRASOUND COMPARISON:  CT of the abdomen on 03/07/2017 FINDINGS: Ultrasound demonstrates a small simple appearing pleural effusion on the right. The effusion is not large enough to safely perform thoracentesis. This effusion does not appear complex or loculated by ultrasound. IMPRESSION: Small right pleural effusion which is not large enough to warrant thoracentesis attempt. This effusion does not appear loculated or complex by ultrasound. Electronically Signed   By: Aletta Edouard M.D.   On: 02/27/2017 16:35    Scheduled Meds: . docusate sodium  100 mg Oral BID  . enoxaparin (LOVENOX) injection  40 mg Subcutaneous Q24H  . gabapentin  300 mg Oral QPM  . pseudoephedrine  60 mg Oral QHS   And  . guaiFENesin  600 mg Oral QHS  . linaclotide  290 mcg Oral QAC breakfast  . methylPREDNISolone (SOLU-MEDROL) injection  60 mg Intravenous Q6H  . pantoprazole  40 mg Oral Daily  . sodium chloride flush  3 mL Intravenous Q12H  . tamsulosin  0.4 mg Oral Daily   Continuous Infusions: . sodium chloride    . sodium chloride 75 mL/hr at 03/01/17 0800  . azithromycin Stopped (02/28/17 2304)  . cefTRIAXone (ROCEPHIN) IVPB 1 gram/50 mL D5W Stopped (02/28/17 2205)  . dexmedetomidine (PRECEDEX) IV infusion 1.1 mcg/kg/hr (03/01/17 1117)  . dexmedetomidine (PRECEDEX) IV infusion    . sodium phosphate  Dextrose 5% IVPB 30 mmol (03/01/17 0750)    Assessment/Plan:  1. Acute on chronic hypercapnic respiratory failure. Patient not wearing his BiPAP at  night. PCO2 is high and mental status impaired. Patient on Precedex drip in the ICU to keep the patient,. Still unable to get him to wear his BiPAP at night. Because of this, the overall prognosis is going to be poor.  May benefit from palliative care consultation on Monday. Patient empirically put on antibiotics and Solu-Medrol. Patient will be a candidate for Trilogy machine as outpatient if able to tolerate. 2. Acute encephalopathy and Lethargy secondary to high PCO2. Patient on Precedex drip 3. Metabolic alkalosis and hyponatremia. Patient placed on  IV fluid hydration 4. Hypomagnesemia on magnesium supplementation 5. Leukopenia. Patient placed on empiric antibiotics  Code Status:     Code Status Orders        Start     Ordered   02/27/17 0352  Full code  Continuous     02/27/17 0351    Code Status History    Date Active Date Inactive Code Status Order ID Comments User Context   06/07/2016  4:30 PM 06/12/2016  7:48 PM Full Code [id number]  Elwin Mocha, MD Inpatient   05/20/2016  6:59 AM 05/21/2016  6:51 AM Full Code [id number]  Harrie Foreman, MD Inpatient   05/09/2016  6:47 PM 05/15/2016  9:00 PM Full Code [id number]  Consuella Lose, MD Inpatient   01/14/2016 10:32 PM 01/16/2016  7:57 PM Full Code [id number]  Lance Coon, MD Inpatient   09/14/2015 10:02 PM 09/16/2015  7:42 PM Full Code [id number]  Toy Baker, MD Inpatient    Advance Directive Documentation     Most Recent Value  Type of Advance Directive  Healthcare Power of Attorney  Pre-existing out of facility DNR order (yellow form or pink MOST form)  -  "MOST" Form in Place?  -     Family Communication: As per critical care specialist Disposition Plan: To be determined based on clinical course  Consultants:  Critical care specialist  Antibiotics:  Rocephin  Zithromax  Time spent: 25 minutes  [place], [place]

## 2017-03-01 NOTE — Progress Notes (Signed)
Patient much calmer, compliant with turning in bed to be changed. States he "doesn't feel well" but is unable to tell me where or why. Denies pain and nausea. Safety maintained.

## 2017-03-01 NOTE — Progress Notes (Signed)
Patient became very agitated at this time. Ripped his Bi-Pap mask off of his face and tried pulling it apart. Patient was confused and we were unable to reason with him to put it back on. Patient wouldn't let us touch him to put O2 on his via [place]. I had increased the Precedex to 1.64mcg/kg/min as M. Patria Mane, NP, had suggested with no effect on patient. NP notified of this and the situation and an order received to give patient Haldol and Ativan. Meds given and patient was still very upset with staff, wanting to "go to his mailbox" and other such things, however he let us put the [place] in his nares as so he is now sating well into the 90s. When he was refusing both Bi-Pap and [place] he was desating quickly into the low 80s.

## 2017-03-02 ENCOUNTER — Inpatient Hospital Stay: Payer: Medicare HMO

## 2017-03-02 LAB — BLOOD GAS, ARTERIAL
ACID-BASE EXCESS: 11.6 mmol/L — AB (ref 0.0–2.0)
Acid-Base Excess: 24.8 mmol/L — ABNORMAL HIGH (ref 0.0–2.0)
Acid-Base Excess: 17.8 mmol/L — ABNORMAL HIGH (ref 0.0–2.0)
Bicarbonate: 57.1 mmol/L — ABNORMAL HIGH (ref 20.0–28.0)
Bicarbonate: 42.1 mmol/L — ABNORMAL HIGH (ref 20.0–28.0)
Bicarbonate: 39 mmol/L — ABNORMAL HIGH (ref 20.0–28.0)
DELIVERY SYSTEMS: POSITIVE
EXPIRATORY PAP: 6
Expiratory PAP: 6
FIO2: 0.36
FIO2: 0.3
FIO2: 0.3
INSPIRATORY PAP: 16
Inspiratory PAP: 16
Mechanical Rate: 12
Mode: POSITIVE
O2 SAT: 93.3 %
O2 Saturation: 99.6 %
O2 Saturation: 89.7 %
PATIENT TEMPERATURE: 37
PATIENT TEMPERATURE: 37
PCO2 ART: 47 mmHg (ref 32.0–48.0)
PH ART: 7.3 — AB (ref 7.350–7.450)
PH ART: 7.4 (ref 7.350–7.450)
PO2 ART: 58 mmHg — AB (ref 83.0–108.0)
Patient temperature: 37
pCO2 arterial: 116 mmHg (ref 32.0–48.0)
pCO2 arterial: 63 mmHg — ABNORMAL HIGH (ref 32.0–48.0)
pH, Arterial: 7.56 — ABNORMAL HIGH (ref 7.350–7.450)
pO2, Arterial: 189 mmHg — ABNORMAL HIGH (ref 83.0–108.0)
pO2, Arterial: 58 mmHg — ABNORMAL LOW (ref 83.0–108.0)

## 2017-03-02 LAB — BASIC METABOLIC PANEL
Anion gap: 5 (ref 5–15)
BUN: 18 mg/dL (ref 6–20)
CO2: 39 mmol/L — AB (ref 22–32)
CREATININE: 0.42 mg/dL — AB (ref 0.61–1.24)
Calcium: 9.3 mg/dL (ref 8.9–10.3)
Chloride: 83 mmol/L — ABNORMAL LOW (ref 101–111)
GFR calc Af Amer: >60 mL/min (ref >60)
GFR calc non Af Amer: >60 mL/min (ref >60)
GLUCOSE: 113 mg/dL — AB (ref 65–99)
Potassium: 4 mmol/L (ref 3.5–5.1)
Sodium: 127 mmol/L — ABNORMAL LOW (ref 135–145)

## 2017-03-02 LAB — PHOSPHORUS: PHOSPHORUS: 2.1 mg/dL — AB (ref 2.5–4.6)

## 2017-03-02 LAB — MAGNESIUM: Magnesium: 1.8 mg/dL (ref 1.7–2.4)

## 2017-03-02 MED ORDER — METHYLPREDNISOLONE SODIUM SUCC 125 MG IJ SOLR: 60.0000 mg | Freq: Two times a day (BID) | INTRAMUSCULAR | Status: DC

## 2017-03-02 MED ORDER — MORPHINE BOLUS VIA INFUSION
5.0000 mg | INTRAVENOUS | Status: DC | PRN
  Administered 2017-03-02: 10 mg via INTRAVENOUS
  Administered 2017-03-02: 5 mg via INTRAVENOUS
  Filled 2017-03-02: qty 20

## 2017-03-02 MED ORDER — DOCUSATE SODIUM 100 MG PO CAPS: 100.0000 mg | ORAL_CAPSULE | Freq: Two times a day (BID) | ORAL | Status: DC | PRN

## 2017-03-02 MED ORDER — FLEET ENEMA 7-19 GM/118ML RE ENEM: 1.0000 | ENEMA | Freq: Every day | RECTAL | Status: DC | PRN

## 2017-03-02 MED ORDER — LORAZEPAM 2 MG/ML IJ SOLN
0.5000 mg | INTRAMUSCULAR | Status: DC | PRN
  Administered 2017-03-02: 0.5 mg via INTRAVENOUS
  Filled 2017-03-02: qty 1

## 2017-03-02 MED ORDER — MORPHINE SULFATE (PF) 2 MG/ML IV SOLN
1.0000 mg | INTRAVENOUS | Status: DC | PRN
  Administered 2017-03-02: 2 mg via INTRAVENOUS

## 2017-03-02 MED ORDER — BISACODYL 10 MG RE SUPP: 10.0000 mg | Freq: Every day | RECTAL | Status: DC | PRN

## 2017-03-02 MED ORDER — METHYLPREDNISOLONE SODIUM SUCC 125 MG IJ SOLR
INTRAMUSCULAR | Status: AC
  Filled 2017-03-02: qty 2

## 2017-03-02 MED ORDER — METOPROLOL TARTRATE 25 MG PO TABS
25.0000 mg | ORAL_TABLET | Freq: Two times a day (BID) | ORAL | Status: DC
  Administered 2017-03-02: 25 mg via ORAL
  Filled 2017-03-02: qty 1

## 2017-03-02 MED ORDER — SODIUM CHLORIDE 0.9 % IV SOLN
10.0000 mg/h | INTRAVENOUS | Status: DC
  Administered 2017-03-02: 5 mg/h via INTRAVENOUS
  Filled 2017-03-02: qty 10

## 2017-03-02 MED ORDER — LACTULOSE 10 GM/15ML PO SOLN
30.0000 g | Freq: Once | ORAL | Status: AC
Start: 2017-03-02 — End: 2017-03-02
  Administered 2017-03-02: 30 g via ORAL
  Filled 2017-03-02: qty 60

## 2017-03-03 LAB — CULTURE, BLOOD (ROUTINE X 2)
CULTURE: NO GROWTH
Culture: NO GROWTH
Special Requests: ADEQUATE
Special Requests: ADEQUATE

## 2017-03-04 ENCOUNTER — Telehealth: Payer: Self-pay

## 2017-03-04 ENCOUNTER — Ambulatory Visit: Payer: Medicare HMO

## 2017-03-04 NOTE — Telephone Encounter (Signed)
Placed in DK's folder to sign.

## 2017-03-04 NOTE — Telephone Encounter (Signed)
Received death certificate from [place] in [place] box

## 2017-03-04 NOTE — Telephone Encounter (Signed)
Funeral home called and informed death cert ready for pick up. Placed up front for pick up. Nothing further needed.

## 2017-03-17 ENCOUNTER — Ambulatory Visit: Payer: Medicare HMO | Admitting: Pulmonary Disease

## 2017-03-26 NOTE — Progress Notes (Signed)
Pulmonary/critical care  Rapid response call secondary to patient with labored breathing and desaturation. Started back on noninvasive. We'll moved back to the intensive care unit. Discussed with hospitalist service. We'll place palliative care consult.  We'll continue same medications as this morning, presently on noninvasive ventilation, will obtain arterial blood gas after he's been on BiPAP for 30 minutes to an hour.  [name], D.O.

## 2017-03-26 NOTE — Progress Notes (Signed)
[facility] received a PG for room 144A. PT was moved to [place]. Bryan escorted PT's son to waiting area and stayed with PT until son could come back to PT's room. [place] off   2017-03-18 1345  Clinical Encounter Type  Visited With Patient and family together  Visit Type Initial  Referral From Nurse  Consult/Referral To Chaplain  Spiritual Encounters  Spiritual Needs Emotional  ered assistance if needed in the future.

## 2017-03-26 NOTE — Progress Notes (Signed)
Patient with increased WOB upon shift assessment family at bedside spoke with wife, Vaughan Basta, she expressed wishes to keep patient comfortable. Call placed to [facility] spoke with Dr. Oletta Darter who spoke with patient wife. Comfort care orders in place and morphine started WOB starting to ease. Chaplain on unit for support at this time.

## 2017-03-26 NOTE — Progress Notes (Signed)
eLink Physician-Brief Progress Note Patient Name: Benjamin ZWEBER Sr. DOB: 19-Aug-1946 MRN: [id number]   Date of Service  Mar 26, 2017  HPI/Events of Note  Spoke with patient's wife, Benjamin Turner, about her desire to stop all life sustaining therapy at this point in time given the patient's clinical deterioration and allow her husband to pass with comfort and dignity.   eICU Interventions  Will make patient comfort measures only. Will discontinue all life sustaining therapy at this time and start a Morphine IV infusion for patient comfort.      Intervention Category Major Interventions: End of life / care limitation discussion  Lysle Dingwall Mar 26, 2017, 7:39 PM

## 2017-03-26 NOTE — Progress Notes (Signed)
Responded to rapid response and patient had labored breathing with saturations in the 70's on 6lpm [place].  Patient placed on 100% non rebreather mask saturations increasing into the 90's.  Patient then immediately placed on BIPAP and transported to the ICU.  Report handed off to the ICU respiratory therapist.

## 2017-03-26 NOTE — Significant Event (Signed)
Rapid Response Event Note  Overview:  called for 02 desaturation, decreased LOC    Initial Focused Assessment: patient laying in bed, resp therapist putting on bipap, decreased LOC, 02 sats reported in 60"s   Interventions: Dr Jefferson Fuel and this RN in room, transfer pt back to ICU for continuous bipap  Plan of Care (if not transferred):  Event Summary:   at      at          [place]

## 2017-03-26 NOTE — Progress Notes (Signed)
Patient appears more awake and alert this am, Precedex stopped. Family at bedside, report called to Vicente Males and pt moved to floor in stable condition

## 2017-03-26 NOTE — Progress Notes (Signed)
Patient ID: Benjamin TOWELL Sr., male   DOB: 08/21/1946, 71 y.o.   MRN: [id number]  [facility] PROGRESS NOTE  Benjamin PACHOLSKI Sr. [id number] DOB: 1946/04/12 DOA: 03/16/2017 PCP: Marguerita Merles, MD  HPI/Subjective: Patient awakens from sleep when I was palpating his abdomen. He stated he feels terrible. Hard to understand when he was trying to say to me. Did answer some yes or no questions. As per nursing staff and respiratory, he did not wear his BiPAP last night for too long.  Objective: Vitals:   03/17/17 1000 2017-03-17 1015  BP: (!) 177/92 (!) 177/79  Pulse: 82 91  Resp:    Temp:      Filed Weights   03/20/2017 2005 02/27/17 0403 02/27/17 2000  Weight: 77.1 kg (170 lb) 72.1 kg (159 lb) 71.8 kg (158 lb 4.6 oz)    ROS: Review of Systems  Constitutional: Negative for chills and fever.  Eyes: Negative for blurred vision.  Respiratory: Positive for shortness of breath. Negative for cough.   Cardiovascular: Negative for chest pain.  Gastrointestinal: Positive for abdominal pain and constipation. Negative for diarrhea, nausea and vomiting.  Genitourinary: Negative for dysuria.  Musculoskeletal: Negative for joint pain.  Neurological: Negative for dizziness and headaches.   Exam: Physical Exam  HENT:  Nose: No mucosal edema.  Mouth/Throat: No oropharyngeal exudate.  Eyes: Conjunctivae and lids are normal. Pupils are equal, round, and reactive to light.  Neck: No JVD present. Carotid bruit is not present. No edema present. No thyroid mass and no thyromegaly present.  Cardiovascular: Regular rhythm, S1 normal, S2 normal and normal heart sounds.  Exam reveals no gallop.   No murmur heard. Pulses:      Dorsalis pedis pulses are 2+ on the right side, and 2+ on the left side.  Respiratory: Accessory muscle usage present. He has decreased breath sounds in the right middle field, the right lower field, the left middle field and the left lower field. He has no wheezes. He has no  rhonchi. He has no rales.  GI: Soft. Bowel sounds are normal. There is no tenderness.  Musculoskeletal:       Right ankle: He exhibits swelling.       Left ankle: He exhibits swelling.  Lymphadenopathy:    He has no cervical adenopathy.  Neurological: He is alert.  Skin: Skin is warm. No rash noted. Nails show no clubbing.  Psychiatric: He has a normal mood and affect.      Data Reviewed: Basic Metabolic Panel:  Recent Labs Lab 02/23/2017 2055 02/27/17 0712 03/01/17 0429 2017-03-17 0423  NA 130* 126* 125* 127*  K 4.7 3.9 4.5 4.0  CL 76* 76* 78* 83*  CO2 49* 43* 40* 39*  GLUCOSE 97 108* 126* 113*  BUN 12 7 20 18   CREATININE 0.50* 0.44* 0.65 0.42*  CALCIUM 10.0 9.2 9.5 9.3  MG  --   --  1.5* 1.8  PHOS  --   --  2.4* 2.1*   Liver Function Tests:  Recent Labs Lab 02/23/2017 2055  AST 17  ALT 13*  ALKPHOS 74  BILITOT 0.8  PROT 6.6  ALBUMIN 3.7   CBC:  Recent Labs Lab 02/23/2017 2055 02/27/17 0712 03/01/17 0429  WBC 6.4 6.7 3.2*  HGB 12.8* 12.0* 12.0*  HCT 38.7* 36.3* 35.2*  MCV 87.1 87.4 85.2  PLT 133* 122* 115*   Cardiac Enzymes:  Recent Labs Lab 02/28/2017 2055  TROPONINI <0.03   BNP (last 3 results)  Recent  Labs  11/21/16 0642  BNP 42.0     CBG:  Recent Labs Lab 02/27/17 1950 02/28/17 0704  GLUCAP 124* 98    Recent Results (from the past 240 hour(s))  Blood Culture (routine x 2)     Status: None (Preliminary result)   Collection Time: 03/19/2017 11:42 PM  Result Value Ref Range Status   Specimen Description BLOOD [facility]  Final   Special Requests   Final    BOTTLES DRAWN AEROBIC AND ANAEROBIC Blood Culture adequate volume   Culture NO GROWTH 4 DAYS  Final   Report Status PENDING  Incomplete  Blood Culture (routine x 2)     Status: None (Preliminary result)   Collection Time: 03/10/2017 11:42 PM  Result Value Ref Range Status   Specimen Description BLOOD [facility]  Final   Special Requests   Final    BOTTLES DRAWN AEROBIC AND ANAEROBIC Blood  Culture adequate volume   Culture NO GROWTH 4 DAYS  Final   Report Status PENDING  Incomplete  MRSA PCR Screening     Status: None   Collection Time: 02/27/17  7:50 PM  Result Value Ref Range Status   MRSA by PCR NEGATIVE NEGATIVE Final    Comment:        The GeneXpert MRSA Assay (FDA approved for NASAL specimens only), is one component of a comprehensive MRSA colonization surveillance program. It is not intended to diagnose MRSA infection nor to guide or monitor treatment for MRSA infections.      Studies: Dg Abd 1 View  Result Date: 2017/03/15 CLINICAL DATA:  Patient with abdominal pain and constipation. EXAM: ABDOMEN - 1 VIEW COMPARISON:  CT abdomen 02/25/2017. FINDINGS: Gas is demonstrated within nondilated loops of large and small bowel in a nonobstructed pattern. Oral contrast material within the ascending and transverse colon. Lumbar spine degenerative changes. Supine evaluation limited for the detection of free intraperitoneal air. IMPRESSION: Nonobstructed bowel gas pattern. Electronically Signed   By: Lovey Newcomer M.D.   On: 03-15-2017 08:58    Scheduled Meds: . docusate sodium  100 mg Oral BID  . enoxaparin (LOVENOX) injection  40 mg Subcutaneous Q24H  . gabapentin  300 mg Oral QPM  . pseudoephedrine  60 mg Oral QHS   And  . guaiFENesin  600 mg Oral QHS  . linaclotide  290 mcg Oral QAC breakfast  . methylPREDNISolone (SOLU-MEDROL) injection  60 mg Intravenous Q12H  . methylPREDNISolone sodium succinate      . metoprolol tartrate  25 mg Oral BID  . pantoprazole  40 mg Oral Daily  . sodium chloride flush  3 mL Intravenous Q12H  . tamsulosin  0.4 mg Oral Daily   Continuous Infusions: . sodium chloride    . azithromycin Stopped (03/01/17 2201)  . cefTRIAXone (ROCEPHIN) IVPB 1 gram/50 mL D5W Stopped (03/01/17 2130)    Assessment/Plan:  1. Acute on chronic hypercapnic respiratory failure. I discussed the importance of wearing BiPAP at night here in the hospital.  Will have care manager consultation to try to get Trilogy noninvasive ventilation at home which he may tolerate better than the BiPAP.  I advised that if he does not wear noninvasive ventilation at night he will be back and see [place] here in the hospital quite frequently. We'll get a palliative care consultation. Empiric antibiotics. Try to titrate steroids. 2. Constipation. Fleet enema Dulcolax suppository when necessary. We'll give a dose of lactulose. 3. Acute encephalopathy and Lethargy secondary to high PCO2. This seems better today. 4.  Metabolic alkalosis and hyponatremia. Place on solid food diet without restriction. 5. Hypomagnesemia- replaced 6. Pancytopenia. Check hepatitis C in the a.m. 7. Essential hypertension placed back on metoprolol 8. Weakness. Physical therapy evaluation 9. Swallow evaluation 10. Check serum protein electrophoresis. However radiology's read the chest x-ray could be suspicious for myeloma.  Code Status:     Code Status Orders        Start     Ordered   02/27/17 0352  Full code  Continuous     02/27/17 0351    Code Status History    Date Active Date Inactive Code Status Order ID Comments User Context   06/07/2016  4:30 PM 06/12/2016  7:48 PM Full Code [id number]  Elwin Mocha, MD Inpatient   05/20/2016  6:59 AM 05/21/2016  6:51 AM Full Code [id number]  Harrie Foreman, MD Inpatient   05/09/2016  6:47 PM 05/15/2016  9:00 PM Full Code [id number]  Consuella Lose, MD Inpatient   01/14/2016 10:32 PM 01/16/2016  7:57 PM Full Code [id number]  Lance Coon, MD Inpatient   09/14/2015 10:02 PM 09/16/2015  7:42 PM Full Code [id number]  Toy Baker, MD Inpatient    Advance Directive Documentation     Most Recent Value  Type of Advance Directive  Healthcare Power of Attorney  Pre-existing out of facility DNR order (yellow form or pink MOST form)  -  "MOST" Form in Place?  -     Family Communication: Son at the bedside Disposition Plan: To be determined  based on clinical course  Consultants:  Critical care specialist  Antibiotics:  Rocephin  Zithromax  Time spent: 25 minutes  [place], [place]

## 2017-03-26 NOTE — Progress Notes (Signed)
CH received PG from ICU that PT's family was upset because PT requested to have his oxygen mask removed. PT said he did not want to live like he was and just wanted to be allowed to pass away. [place] prayed and supported family.    03/20/2017 1735  Clinical Encounter Type  Visited With Patient and family together  Visit Type Follow-up  Referral From Nurse  Consult/Referral To Chaplain  Spiritual Encounters  Spiritual Needs Prayer;Emotional;Grief support

## 2017-03-26 NOTE — Progress Notes (Signed)
Patient ID: Benjamin MAYSONET Sr., male   DOB: 1945-09-29, 71 y.o.   MRN: [id number]  Nursing staff called me that patient became less responsive and his pulse ox dropped down into the 60s.  Case discussed with critical care specialist to get him back to the ICU on BiPAP. Dr Jefferson Fuel already came back too see the patient  Dr. Loletha Grayer

## 2017-03-26 NOTE — Progress Notes (Signed)
[facility] Progess Note    SYNOPSIS   Rector Devonshire is a 71 yo male with known history of Arthritis,BPH, Cervical pain, GERD,Gout, Arthritis,Hypertension,squamous cell carcinoma.   Patient presented to ED on 7/5  with lethargy and constipation.  Patient was noted to have small right lung pleural effusion and was admitted to the med -surg floor.  ON 7/5 patient was transferred from the floor due to hypercarbia requiring BiPAP.  However when the patient arrived on the unit patient was sating well On 3l of oxygen not in any acute distress.   ASSESSMENT/PLAN  Chronic hypercapnic respiratory failure. Significantly improved. PH is 7.56 with a PCO2 of 47. Patient is awake alert and feeling much better. Continue On Solu-Medrol, albuterol, Atrovent, Azithromycin, Rocephin. 5 days for azithromycin and 7 days Rocephin, nocturnal BiPAP, transfer to floor  Encephalopathy. Significantly improved, weaned off of Precedex  Metabolic alkalosis. Will send spot urinary chloride to evaluate for chloride responsive about alkalosis. We'll also send urine osmolality  Hyponatremia. Hyperosmolar hyponatremia with a low fractional excretion of sodium. We'll continue to replace with normal saline, slowly improving  Hypophosphatemia. Mildly low will follow up  Constipation. Will obtain KUB just to make sure no obstruction, will place on stool softener  At this point patient is stable for floor transfer   INTAKE / OUTPUT:  Intake/Output Summary (Last 24 hours) at 03-30-17 0812 Last data filed at March 30, 2017 0400  Gross per 24 hour  Intake          1861.12 ml  Output              410 ml  Net          1451.12 ml    Name: Benjamin Turner Sr. MRN: [id number] DOB: 1946-02-14    ADMISSION DATE:  03/13/2017  SUBJECTIVE:   VITAL SIGNS: Temp:  [97.5 F (36.4 C)-98 F (36.7 C)] 98 F (36.7 C) (07/08 0701) Pulse Rate:  [36-113] 36 (07/08 0600) Resp:  [14-29] 16 (07/08 0600) BP:  (94-170)/(58-115) 111/78 (07/08 0600) SpO2:  [92 %-100 %] 92 % (07/08 0600)   PHYSICAL EXAMINATION: Physical Examination:   VS: BP 111/78   Pulse (!) 36   Temp 98 F (36.7 C)   Resp 16   Ht 5\' 8"  (1.727 m)   Wt 71.8 kg (158 lb 4.6 oz)   SpO2 92%   BMI 24.07 kg/m   General Appearance: No distress  Neuro:without focal findings, mental status normal. HEENT: PERRLA, EOM intact. Pulmonary: normal breath sounds   CardiovascularNormal S1,S2.  No m/r/g.   Abdomen: Benign, Soft, non-tender. Renal:  No costovertebral tenderness  GU:  Not performed at this time. Endocrine: No evident thyromegaly. Skin:   warm, no rashes, no ecchymosis  Extremities: normal, no cyanosis, clubbing.    LABORATORY PANEL:   CBC  Recent Labs Lab 03/01/17 0429  WBC 3.2*  HGB 12.0*  HCT 35.2*  PLT 115*    Chemistries   Recent Labs Lab 03/25/2017 2055  March 30, 2017 0423  NA 130*  < > 127*  K 4.7  < > 4.0  CL 76*  < > 83*  CO2 49*  < > 39*  GLUCOSE 97  < > 113*  BUN 12  < > 18  CREATININE 0.50*  < > 0.42*  CALCIUM 10.0  < > 9.3  MG  --   < > 1.8  PHOS  --   < > 2.1*  AST 17  --   --  ALT 13*  --   --   ALKPHOS 74  --   --   BILITOT 0.8  --   --   < > = values in this interval not displayed.   Recent Labs Lab 02/27/17 1950 02/28/17 0704  GLUCAP 124* 98    Recent Labs Lab 02/28/17 0555 02/28/17 1429 16-Mar-2017 0630  PHART 7.19* 7.49* 7.56*  PCO2ART 120* 63* 47  PO2ART 125* 66* 58*    Recent Labs Lab 03/12/2017 2055  AST 17  ALT 13*  ALKPHOS 74  BILITOT 0.8  ALBUMIN 3.7    Cardiac Enzymes  Recent Labs Lab 03/11/2017 2055  TROPONINI <0.03    RADIOLOGY:  No results found.  [name], DO ICU Pager: [phone] [facility] Office Number: [phone]   16-Mar-2017

## 2017-03-26 NOTE — Progress Notes (Signed)
Pulmonary/critical care  Had discussion with wife and all family members and patient. He does not wish to have BiPAP on anymore. He understands that he may not be able to continue breathing and may pass away. We discussed with keeping him comfortable with as needed morphine and Ativan. His wife understands and agrees. We'll take off BiPAP and place him on nasal cannula. We'll have morphine and Ativan as needed for pain control anxiety and comfort. Will make patient DO NOT RESUSCITATE  [name], D.O.

## 2017-03-26 NOTE — Progress Notes (Signed)
Pt admitted to room 144 from ICU. Pt alert, no complaints of pain at this time. Son at the bedside. Skin assessment completed with Estill Bamberg, RN, tele monitor applied Pt and son oriented to room and call bell. Bed in lowest position call bell in reach and bed alarm on.

## 2017-03-26 NOTE — Progress Notes (Addendum)
RRT called. Pt with O2 sat in 60's. MD Conforti at bedside. Dr. Leslye Peer paged. Resp therapy at bedside. Placed on non-rebreather, HR 110, O2 at 92%. Placed on Bi-Pap for transfer. Pt to transfer to ICU-16. Charge nurse, Joellen Jersey and [employer] transported pt. Palma Holter, RN for Osvaldo Human, RN.

## 2017-03-26 NOTE — Progress Notes (Signed)
[place] got a OR to go to family   Mar 09, 2017 2050  Clinical Encounter Type  Visited With Patient and family together  Visit Type Death  Referral From Nurse  Consult/Referral To Chaplain  Spiritual Encounters  Spiritual Needs Grief support   because PT had just died. Spent time with family and provided grief care.

## 2017-03-26 NOTE — Progress Notes (Signed)
bipap removed per patient's request and morphine given for shortness of breath.

## 2017-03-26 DEATH — deceased

## 2017-04-26 NOTE — Discharge Summary (Signed)
Death Summary  Date of Admission 2017-03-03 Date of Death March 06, 2017  Patient admitted to the hospitalist service on March 03, 2017 Please see Dr. Eric Form history and physical  Benjamin Turner was admitted for lethargy and constipation with a PMH remarkable for arthritis, BPH, GERD, HTN and squamous cell Ca of the skin. He was treated for CAP, pleural effusion and placed on rocephin, azithromycin and admitted to the ward service. He developed progressive respiratory failure with hypercapnia requiring transfer to the ICU for NIV and was started on steroids, breathing treatments and antibiotics were continued. He improved clinically and was transferred back to the ward service. Unfortunately he had another episode on 2017/03/06 and was moved back to the ICU for more intensive respiratory treatments, NIV and close monitoring in case he deteriorated requiring intubation.   After the patient was back in the ICU, I addressed code status and related his clinical deterioration and he may require intubation. His wife and family were all present. He decided that he did not wish any further aggressive care and that his health has dramatically deteriorated and he would not want to be placed on life support or have measures to include CPR. He related rather he wishes to be kept comfortable. DNR was placed, NIV was stopped per patients request and as needed morphine and ativan were prescribed for pain, anxiety and comfort.  Patient deceased and pronounced by Nursing. Please see notes for time of death.   [name], DO

## 2017-06-08 IMAGING — CT CT ABD-PELV W/ CM
2 of 5 series · 16 of 46 positions shown, 18 images · IV contrast (APPLIED)
Comparison: CT abdomen and pelvis January 16, 2016

CLINICAL DATA: Generalized abdominal pain improved with passing
flatus. History of neck fusion 2 weeks ago.

EXAM:
CT ABDOMEN AND PELVIS WITH CONTRAST
TECHNIQUE: Multidetector CT imaging of the abdomen and pelvis was performed
using the standard protocol following bolus administration of
intravenous contrast.
CONTRAST:  100mL [id number] IOPAMIDOL ([id number]) INJECTION 61%

[Series 3: axial st · axial · 0.86mm/px · z∈[-422,+58]mm · 13 of 108 slices shown, 15 images]
[im 6/108  soft-tissue]
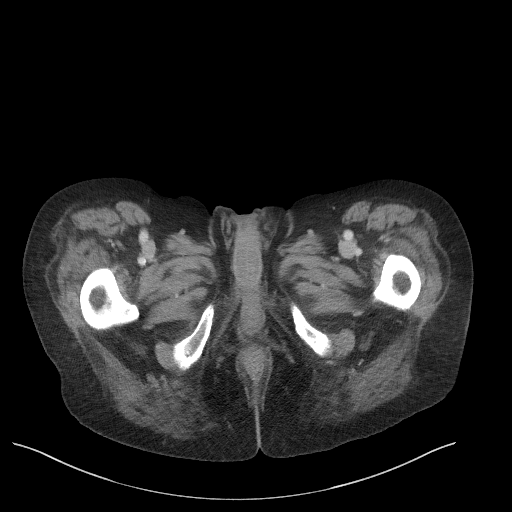
[im 6/108  bone]
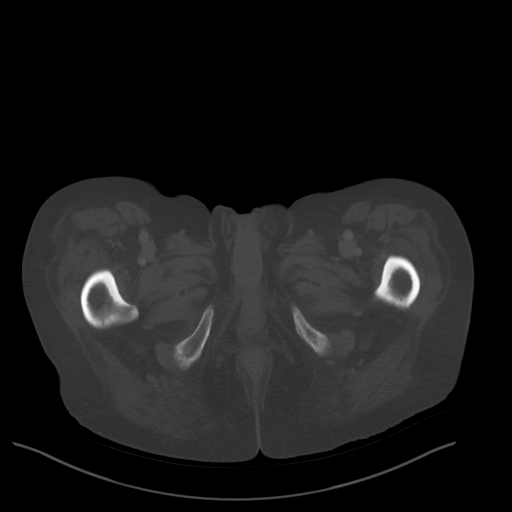
[im 12/108  soft-tissue]
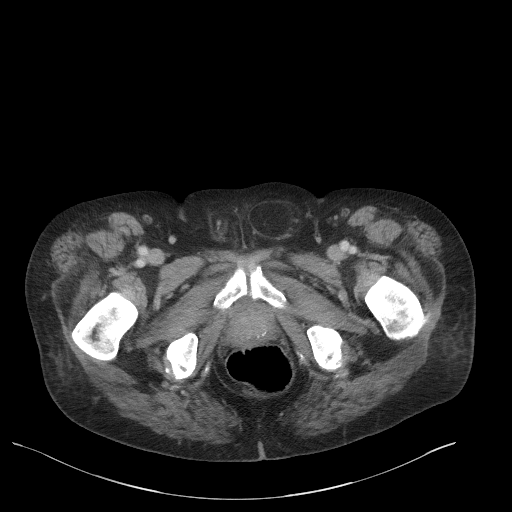
[im 24/108  soft-tissue]
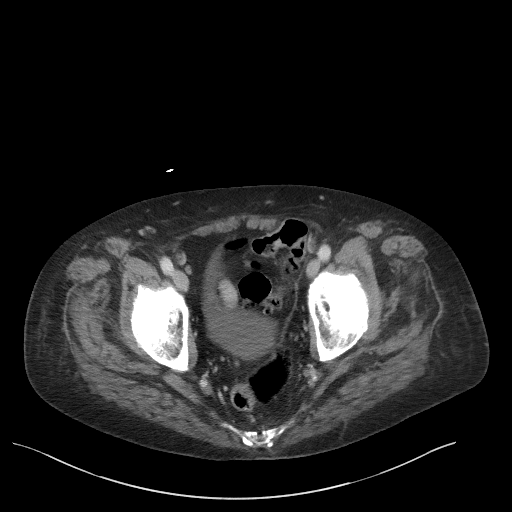
[im 30/108  soft-tissue]
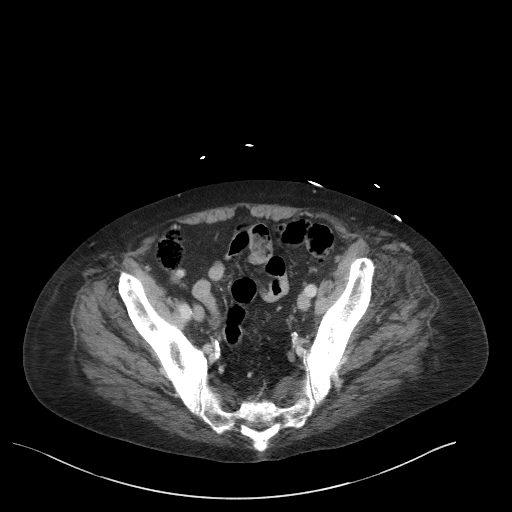
[im 36/108  soft-tissue]
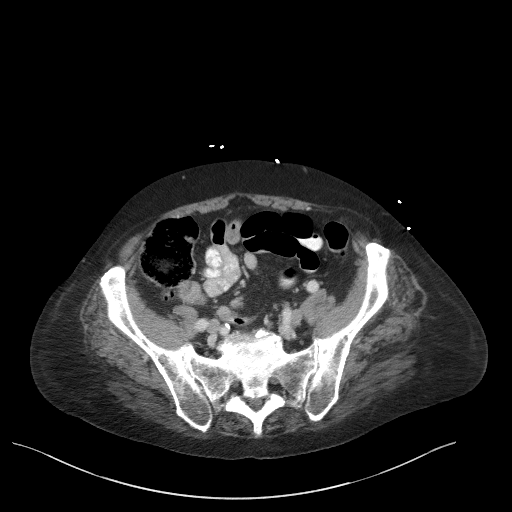
[im 48/108  soft-tissue]
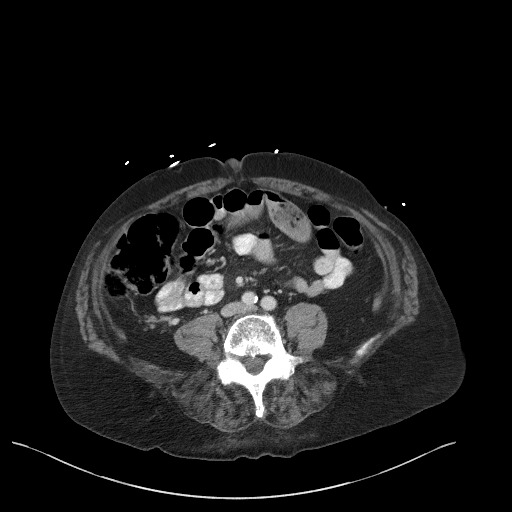
[im 54/108  soft-tissue]
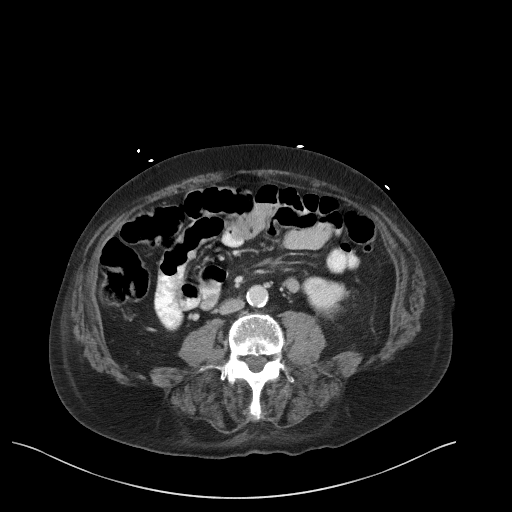
[im 60/108  soft-tissue]
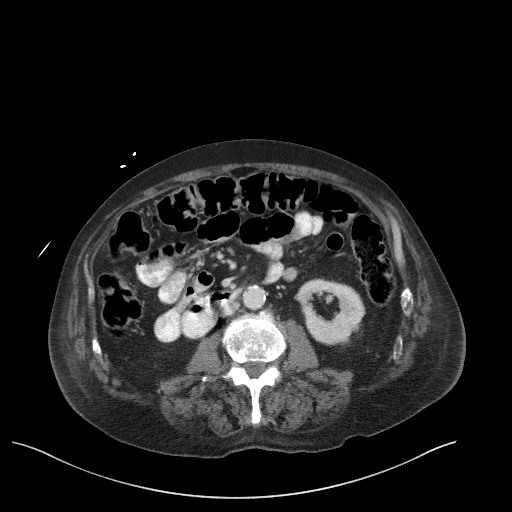
[im 72/108  soft-tissue]
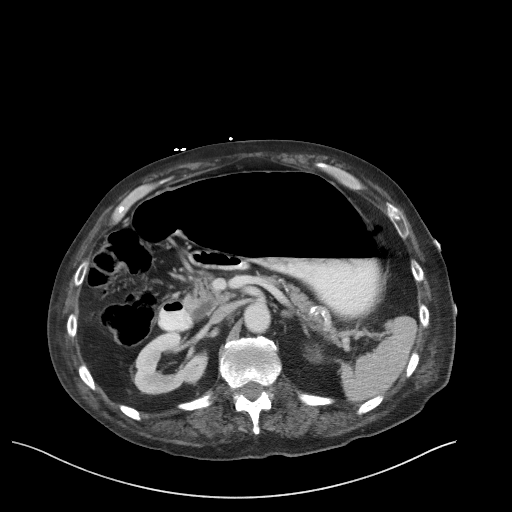
[im 72/108  bone]
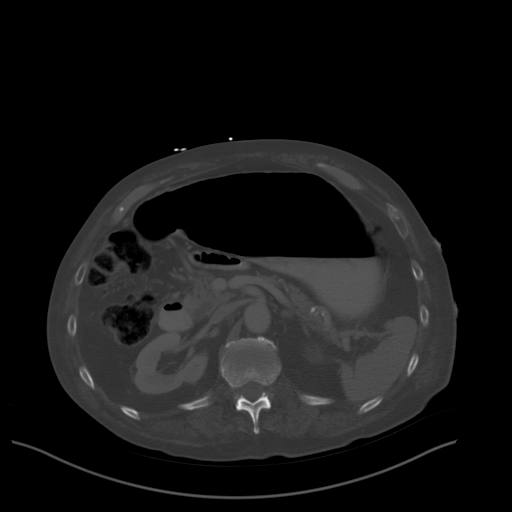
[im 78/108  soft-tissue]
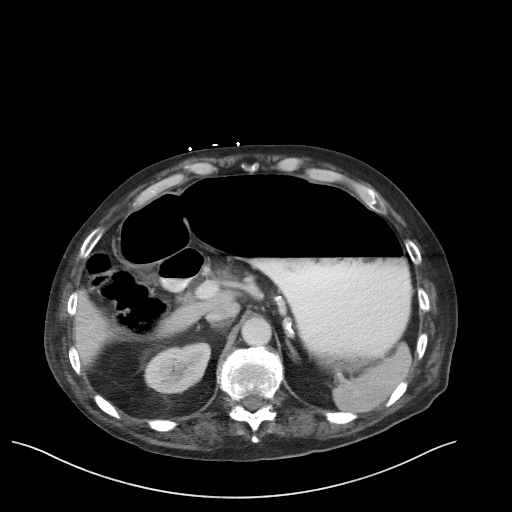
[im 84/108  soft-tissue]
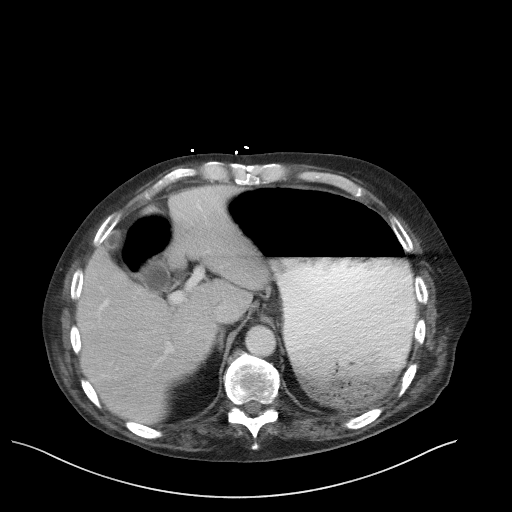
[im 96/108  soft-tissue]
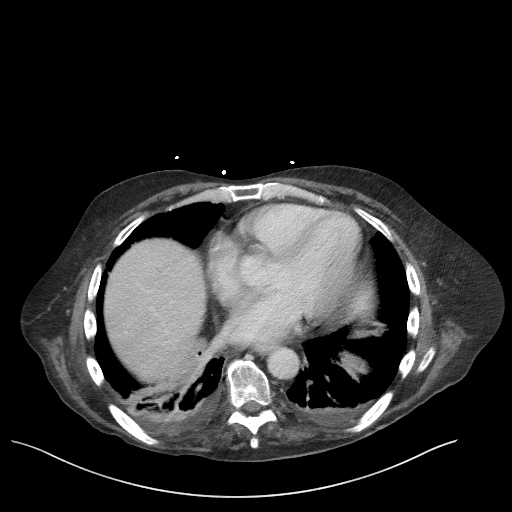
[im 102/108  soft-tissue]
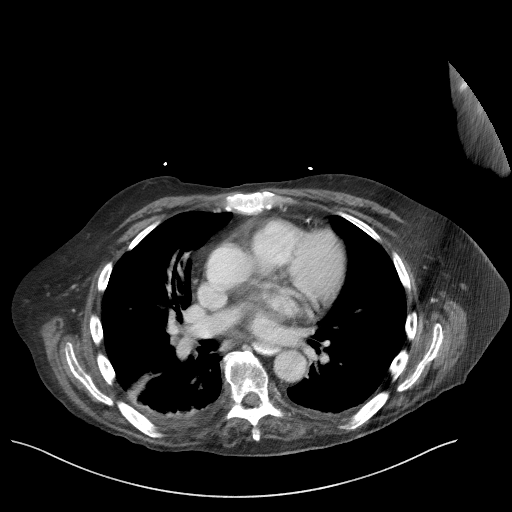

[Series 6: coronal st · coronal · 0.87mm/px · 3 of 89 slices shown]
[im 30/89  soft-tissue]
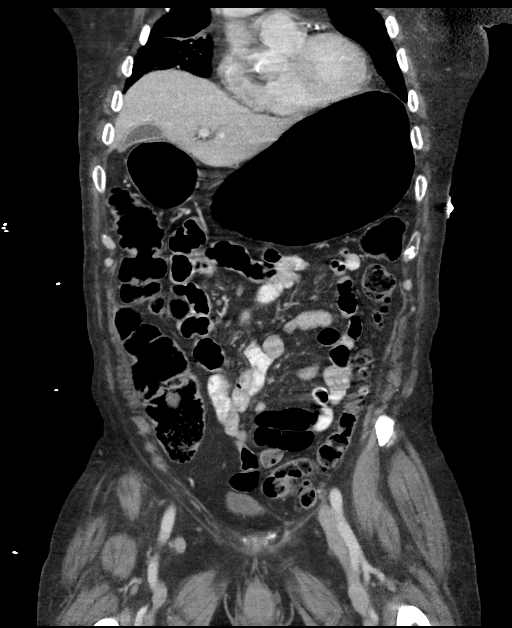
[im 40/89  soft-tissue]
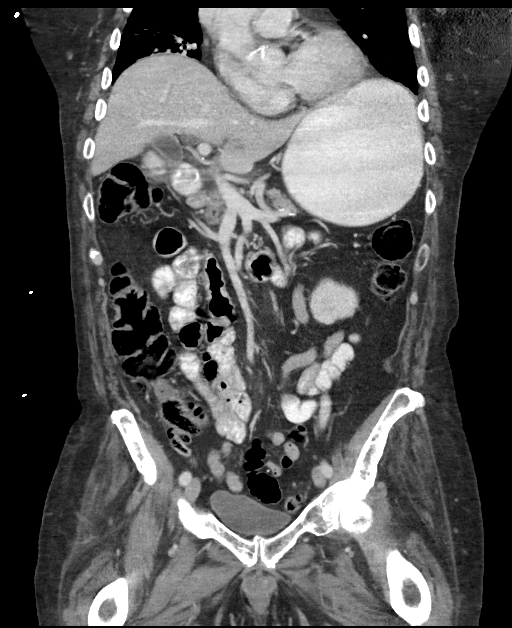
[im 49/89  soft-tissue]
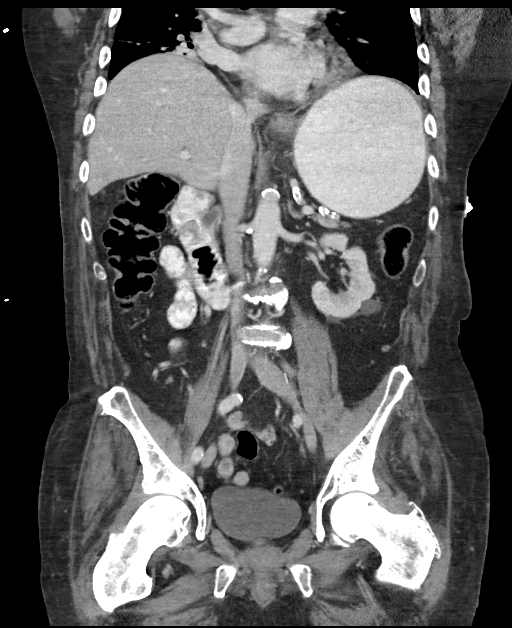

[16 of 46 positions shown; findings below may reference images not displayed]

FINDINGS: LOWER CHEST: Small bilateral pleural effusions and bibasilar
atelectasis. Heart size is normal. Mild coronary artery
calcifications. No pericardial effusions.

HEPATOBILIARY: Liver demonstrates punctate calcification LEFT lobe
compatible with granuloma. The hyper enhancing lesions in the liver
gallbladder.

Nonobstructing LEFT nephrolithiasis.

PANCREAS: Normal.

SPLEEN: Normal.

ADRENALS/URINARY TRACT: Kidneys are orthotopic, demonstrating
symmetric enhancement. 3 mm LEFT lower pole nephrolithiasis. LEFT
renal cysts measure up to 2.4 cm. Too small to characterize
hypodensities in the kidneys bilaterally. No hydronephrosis or solid
renal masses. The unopacified ureters are normal in course and
caliber. Delayed imaging through the kidneys demonstrates symmetric
prompt contrast excretion within the proximal urinary collecting
system. Urinary bladder is partially distended and unremarkable.
Normal adrenal glands.

STOMACH/BOWEL: Markedly gas and contrast distended stomach. The
small and large bowel are normal in course and caliber without
inflammatory changes. Mild descending and sigmoid colon
diverticulosis. Moderate amount of retained large bowel stool.
Normal appendix.

VASCULAR/LYMPHATIC: Aortoiliac vessels are normal in course and
caliber, moderate calcific atherosclerosis. No lymphadenopathy by CT
size criteria.

REPRODUCTIVE: Prostate size is normal.

OTHER: No intraperitoneal free fluid or free air.

MUSCULOSKELETAL: Nonacute. Large fat containing LEFT inguinal
hernia. Degenerative change of lumbar spine resulting in severe
bilateral L5-S1 neural foraminal narrowing, moderate to severe RIGHT
L3-4 and bilateral L4-5 neural foraminal narrowing.
IMPRESSION: Distended stomach. Decompressed small large bowel, moderate amount
of retained large bowel stool.

3 mm nonobstructing LEFT nephrolithiasis.
